# Patient Record
Sex: Female | Born: 1990 | Race: Black or African American | Hispanic: No | Marital: Single | Smoking: Never smoker
Health system: Southern US, Community
[De-identification: ages and names within clinical notes are randomized; demographics above are authoritative.]

## PROBLEM LIST (undated history)

## (undated) ENCOUNTER — Inpatient Hospital Stay (HOSPITAL_COMMUNITY): Payer: Self-pay

## (undated) DIAGNOSIS — A609 Anogenital herpesviral infection, unspecified: Secondary | ICD-10-CM

## (undated) DIAGNOSIS — G43909 Migraine, unspecified, not intractable, without status migrainosus: Secondary | ICD-10-CM

## (undated) DIAGNOSIS — F32A Depression, unspecified: Secondary | ICD-10-CM

## (undated) DIAGNOSIS — F329 Major depressive disorder, single episode, unspecified: Secondary | ICD-10-CM

## (undated) DIAGNOSIS — B1910 Unspecified viral hepatitis B without hepatic coma: Secondary | ICD-10-CM

## (undated) DIAGNOSIS — O99119 Other diseases of the blood and blood-forming organs and certain disorders involving the immune mechanism complicating pregnancy, unspecified trimester: Secondary | ICD-10-CM

## (undated) DIAGNOSIS — R519 Headache, unspecified: Secondary | ICD-10-CM

## (undated) DIAGNOSIS — O4400 Placenta previa specified as without hemorrhage, unspecified trimester: Secondary | ICD-10-CM

## (undated) DIAGNOSIS — R51 Headache: Secondary | ICD-10-CM

## (undated) DIAGNOSIS — D696 Thrombocytopenia, unspecified: Secondary | ICD-10-CM

## (undated) HISTORY — DX: Migraine, unspecified, not intractable, without status migrainosus: G43.909

## (undated) HISTORY — DX: Thrombocytopenia, unspecified: D69.6

## (undated) HISTORY — DX: Major depressive disorder, single episode, unspecified: F32.9

## (undated) HISTORY — DX: Depression, unspecified: F32.A

## (undated) HISTORY — DX: Other diseases of the blood and blood-forming organs and certain disorders involving the immune mechanism complicating pregnancy, unspecified trimester: O99.119

---

## 2014-01-15 ENCOUNTER — Emergency Department (HOSPITAL_COMMUNITY)
Admission: EM | Admit: 2014-01-15 | Discharge: 2014-01-15 | Disposition: A | Discharge status: Home / Self Care | Payer: Medicaid Other | Attending: Emergency Medicine | Admitting: Emergency Medicine

## 2014-01-15 ENCOUNTER — Encounter (HOSPITAL_COMMUNITY): Payer: Self-pay | Admitting: Emergency Medicine

## 2014-01-15 ENCOUNTER — Other Ambulatory Visit: Payer: Self-pay

## 2014-01-15 DIAGNOSIS — R51 Headache: Secondary | ICD-10-CM | POA: Insufficient documentation

## 2014-01-15 DIAGNOSIS — R55 Syncope and collapse: Secondary | ICD-10-CM | POA: Insufficient documentation

## 2014-01-15 DIAGNOSIS — R519 Headache, unspecified: Secondary | ICD-10-CM

## 2014-01-15 DIAGNOSIS — R9431 Abnormal electrocardiogram [ECG] [EKG]: Secondary | ICD-10-CM | POA: Insufficient documentation

## 2014-01-15 DIAGNOSIS — Z3202 Encounter for pregnancy test, result negative: Secondary | ICD-10-CM | POA: Insufficient documentation

## 2014-01-15 DIAGNOSIS — R42 Dizziness and giddiness: Secondary | ICD-10-CM | POA: Insufficient documentation

## 2014-01-15 LAB — CBC WITH DIFFERENTIAL/PLATELET
Basophils Absolute: 0 10*3/uL (ref 0.0–0.1)
Basophils Relative: 1 % (ref 0–1)
Eosinophils Absolute: 0.3 10*3/uL (ref 0.0–0.7)
Eosinophils Relative: 4 % (ref 0–5)
HCT: 36.8 % (ref 36.0–46.0)
Hemoglobin: 12.7 g/dL (ref 12.0–15.0)
Lymphocytes Relative: 39 % (ref 12–46)
Lymphs Abs: 2.6 10*3/uL (ref 0.7–4.0)
MCH: 28.7 pg (ref 26.0–34.0)
MCHC: 34.5 g/dL (ref 30.0–36.0)
MCV: 83.3 fL (ref 78.0–100.0)
Monocytes Absolute: 0.5 10*3/uL (ref 0.1–1.0)
Monocytes Relative: 8 % (ref 3–12)
Neutro Abs: 3.2 10*3/uL (ref 1.7–7.7)
Neutrophils Relative %: 48 % (ref 43–77)
Platelets: 146 10*3/uL — ABNORMAL LOW (ref 150–400)
RBC: 4.42 MIL/uL (ref 3.87–5.11)
RDW: 13 % (ref 11.5–15.5)
WBC: 6.7 10*3/uL (ref 4.0–10.5)

## 2014-01-15 LAB — BASIC METABOLIC PANEL
BUN: 11 mg/dL (ref 6–23)
CO2: 22 mEq/L (ref 19–32)
Calcium: 9.1 mg/dL (ref 8.4–10.5)
Chloride: 105 mEq/L (ref 96–112)
Creatinine, Ser: 0.84 mg/dL (ref 0.50–1.10)
GFR calc Af Amer: >90 mL/min (ref >90)
GFR calc non Af Amer: >90 mL/min (ref >90)
Glucose, Bld: 103 mg/dL — ABNORMAL HIGH (ref 70–99)
Potassium: 3.6 mEq/L — ABNORMAL LOW (ref 3.7–5.3)
Sodium: 139 mEq/L (ref 137–147)

## 2014-01-15 LAB — POC URINE PREG, ED: Preg Test, Ur: NEGATIVE

## 2014-01-15 MED ORDER — IBUPROFEN 800 MG PO TABS: 800.0000 mg | ORAL_TABLET | Freq: Three times a day (TID) | ORAL | Status: DC | PRN

## 2014-01-15 MED ORDER — KETOROLAC TROMETHAMINE 15 MG/ML IJ SOLN
15.0000 mg | Freq: Once | INTRAMUSCULAR | Status: AC
  Administered 2014-01-15: 15 mg via INTRAVENOUS
  Filled 2014-01-15: qty 1

## 2014-01-15 NOTE — ED Notes (Signed)
Per EMS: unwitnessed syncopal episode at work, time was 1100, patient works for [employer], has intermittent h/a that fluctuates between 5/10 and a 10/10 in severity, patient slightly tachycardic but otherwise VSS, CBG 110, denies.  EMS states fall was unwitnessed, patient was at work station when she had syncopal episode, no neural deficits noted

## 2014-01-15 NOTE — ED Provider Notes (Signed)
CSN: [id number]     Arrival date & time 01/15/14  1258 History   First MD Initiated Contact with Patient 01/15/14 1306     Chief Complaint  Patient presents with  . Loss of Consciousness     (Consider location/radiation/quality/duration/timing/severity/associated sxs/prior Treatment) HPI Comments: Robin Nguyen is a 23 y.o. Female in good health, presenting the Emergency Department with a chief complaint of syncope.  The patient reports an un witnessed syncopal event while at work today.  She reports the event was preceded by a generalized headache.  She reports similar episodes in the past with headaches, but has never been evaluated by a medical professional.  Denies seizure like activity or loss of bowel or bladder function. Denies history of arrythmia, murmur, or other cardiology disorders. LNMP: Patient's last menstrual period was 12/28/2013.     Patient is a 23 y.o. female presenting with syncope. The history is provided by the patient. No language interpreter was used.  Loss of Consciousness Associated symptoms: headaches   Associated symptoms: no chest pain, no fever, no palpitations, no seizures, no shortness of breath and no vomiting     History reviewed. No pertinent past medical history. History reviewed. No pertinent past surgical history. No family history on file. History  Substance Use Topics  . Smoking status: Never Smoker   . Smokeless tobacco: Not on file  . Alcohol Use: No   OB History   Grav Para Term Preterm Abortions TAB SAB Ect Mult Living                 Review of Systems  Constitutional: Negative for fever and chills.  Eyes: Negative for photophobia.  Respiratory: Negative for shortness of breath.   Cardiovascular: Positive for syncope. Negative for chest pain, palpitations and leg swelling.  Gastrointestinal: Negative for vomiting, abdominal pain and diarrhea.  Neurological: Positive for syncope, light-headedness and headaches. Negative for  tremors, seizures and numbness.      Allergies  Review of patient's allergies indicates not on file.  Home Medications   Current Outpatient Rx  Name  Route  Sig  Dispense  Refill  . ibuprofen (ADVIL,MOTRIN) 200 MG tablet   Oral   Take 200 mg by mouth every 6 (six) hours as needed for cramping.          BP 116/69  Pulse 93  Temp(Src) 99.4 F (37.4 C) (Oral)  Resp 17  Ht 5\' 6"  (1.676 m)  Wt 120 lb (54.432 kg)  BMI 19.38 kg/m2  SpO2 100%  LMP 12/28/2013 Physical Exam  Nursing note and vitals reviewed. Constitutional: She is oriented to person, place, and time. She appears well-developed and well-nourished. No distress.  HENT:  Head: Normocephalic and atraumatic.  Right Ear: External ear normal.  Left Ear: External ear normal.  Mouth/Throat: Oropharynx is clear and moist. No oropharyngeal exudate.  Eyes: EOM are normal. Pupils are equal, round, and reactive to light.  Neck: Neck supple. No rigidity.  Cardiovascular: Normal rate.  An irregular rhythm present.  No murmur heard. Pulmonary/Chest: Effort normal and breath sounds normal. No respiratory distress. She has no wheezes. She has no rales. She exhibits no tenderness.  Abdominal: Soft. Bowel sounds are normal. There is no tenderness. There is no rebound and no guarding.  Musculoskeletal: Normal range of motion.  Neurological: She is alert and oriented to person, place, and time. No cranial nerve deficit or sensory deficit. She exhibits normal muscle tone. Coordination normal. GCS eye subscore is 4. GCS verbal subscore  is 5. GCS motor subscore is 6.  Speech is clear and goal oriented, follows commands Cranial nerves III - XII grossly intact, no facial droop Normal strength in upper and lower extremities bilaterally, strong and equal grip strength Sensation normal to light touch Moves all 4 extremities without ataxia, coordination intact Normal finger to nose and rapid alternating movements No pronator drift Normal  gait  Skin: Skin is warm and dry. She is not diaphoretic.  Psychiatric: She has a normal mood and affect. Her behavior is normal. Thought content normal.    ED Course  Procedures (including critical care time) Labs Review Labs Reviewed  CBC WITH DIFFERENTIAL - Abnormal; Notable for the following:    Platelets 146 (*)    All other components within normal limits  BASIC METABOLIC PANEL - Abnormal; Notable for the following:    Potassium 3.6 (*)    Glucose, Bld 103 (*)    All other components within normal limits  POC URINE PREG, ED   Imaging Review No results found.   EKG Interpretation   Date/Time:  Thursday January 15 2014 13:01:56 EDT Ventricular Rate:  94 PR Interval:  145 QRS Duration: 82 QT Interval:  342 QTC Calculation: 428 R Axis:   90 Text Interpretation:  Sinus arrhythmia Probable left atrial enlargement  Borderline right axis deviation No prior Confirmed by Gwendolyn GrantWALDEN  MD, BLAIR  ([id number]) on 01/15/2014 1:34:28 PM      MDM   Final diagnoses:  Syncope  Headache  Abnormal EKG   Pt with history of syncopal events related to headaches. No neurologic deficits on exam. Abnormal sinus rhythm, no other concerning or correlating abnormalities with work up.Will have the patient follow up with neurology for headaches and cardio for further evaluation of arrythmia.  Re-eval pt resting comfortably in room reports decrease in headache discomfort with medication.  Discussed lab results, and treatment plan with the patient and the patient's mother. Return precautions given. Reports understanding and no other concerns at this time.  Patient is stable for discharge at this time.   Meds given in ED:  Medications  ketorolac (TORADOL) 15 MG/ML injection 15 mg (15 mg Intravenous Given 01/15/14 1619)    Discharge Medication List as of 01/15/2014  3:16 PM    START taking these medications   Details  !! ibuprofen (ADVIL,MOTRIN) 800 MG tablet Take 1 tablet (800 mg total) by mouth every 8  (eight) hours as needed for headache. Take with meals, Starting 01/15/2014, Until Discontinued, Print     !! - Potential duplicate medications found. Please discuss with provider.          Clabe SealLauren M Shalona Harbour, PA-C 01/17/14 1218

## 2014-01-15 NOTE — Discharge Instructions (Signed)
Call for a follow up appointment with a Family or Primary Care Provider.  Call for an appointment with a neurologist for further evaluation of your headaches and Cardiology if you continue to have syncope (passing out) episodes. Return if Symptoms worsen.   Take medication as prescribed.  Make sure you eat a well balanced meal and are drinking plenty of fluids.    Emergency Department Resource Guide 1) Find a Doctor and Pay Out of Pocket Although you won't have to find out who is covered by your insurance plan, it is a good idea to ask around and get recommendations. You will then need to call the office and see if the doctor you have chosen will accept you as a new patient and what types of options they offer for patients who are self-pay. Some doctors offer discounts or will set up payment plans for their patients who do not have insurance, but you will need to ask so you aren't surprised when you get to your appointment.  2) Contact Your Local Health Department Not all health departments have doctors that can see patients for sick visits, but many do, so it is worth a call to see if yours does. If you don't know where your local health department is, you can check in your phone book. The CDC also has a tool to help you locate your state's health department, and many state websites also have listings of all of their local health departments.  3) Find a Walk-in Clinic If your illness is not likely to be very severe or complicated, you may want to try a walk in clinic. These are popping up all over the country in pharmacies, drugstores, and shopping centers. They're usually staffed by nurse practitioners or physician assistants that have been trained to treat common illnesses and complaints. They're usually fairly quick and inexpensive. However, if you have serious medical issues or chronic medical problems, these are probably not your best option.  No Primary Care Doctor: - Call Health Connect at   [phone] - they can help you locate a primary care doctor that  accepts your insurance, provides certain services, etc. - Physician Referral Service- [phone]  Chronic Pain Problems: Organization         Address  Phone   Notes  [facility]  [phone] Patients need to be referred by their primary care doctor.   Medication Assistance: Organization         Address  Phone   Notes  [facility]ication [facility] [address] [place], [place] [address] [phone] --Must be a resident of [place] -- Must have NO insurance coverage whatsoever (no Medicaid/ Medicare, etc.) -- The pt. MUST have a primary care doctor that directs their care regularly and follows them in the community   MedAssist  [phone]) [phone]   [employer]  [phone]    Agencies that provide inexpensive medical care: Organization         Address  Phone   Notes  [facility]  [phone]) [phone]   [facility]    [phone]   [facility] [address] [place], [place] [address] [phone]   [facility] [address], [place] [phone]) [phone]   [facility]anned Parenthood    [phone]   [facility]    [phone]   [facility]  [address],  [place] Phone:  [phone], Fax:  [phone] Hours of Operation:  9 am - 6 pm, M-F.  Also accepts Medicaid/Medicare and self-pay.  [facility] for [place], [address], [place] Phone: [phone], Fax: [phone]. Hours of Operation:  8:30 am - 5:30 pm, M-F.  Also accepts Medicaid and self-pay.  [facility] [place] [address], [place] Phone: [phone]   [place], [place], [place] [phone], Ext. [phone] Mondays & Thursdays: 7-9 AM.  First 15 patients are seen on a first come, first serve basis.     [place] Providers:  Organization         Address  Phone   Notes  [facility] [address], [place] [phone] Also accepts self-pay patients.  [facility] [address], [place]  [phone]   [place], [address], [place] [phone]   [facility] [address], [place] [phone]   Lucianne Lei [address], [place]   [phone] Only accepts [place] Access [place] patients after they have their name applied to their card.   Self-Pay (no insurance) in [facility]:  Organization         Address  Phone   Notes  Sickle Cell Patients, [facility] [place] [phone]   [facility] [place] [phone]   [facility] [place]  [place], [place], [place] [phone]   [facility]/Dr. Osei-Bonsu  [address], [place] or [address], [place] [phone] Phone number for both [place] and [place] locations is the same.  [facility] [address] [place] [phone]   [facility] [address], [place] or [address] [phone]   [facility] [address], [place] [phone], phone; [phone], fax Sees patients 1st and 3rd Saturday of every month.  Must not qualify for public or private insurance (i.e. Medicaid, Medicare, [place] Health Choice, Veterans' Benefits)  Household income should be no more than 200% of the poverty level The clinic cannot treat you if you are pregnant or think you are pregnant  Sexually transmitted diseases are not treated at the clinic.    Dental Care: Organization         Address  Phone  Notes  [facility]  Department of [facility] [place] [phone] Accepts children up to age 40 who are enrolled in [place] or [place]; pregnant women with a Medicaid card; and children who have applied for Medicaid or [place] Health Choice, but were declined, whose parents can pay a reduced fee at time of service.  [facility] Department of [facility]  [address], [place] [phone] Accepts children up to age 58 who are enrolled in [place] or [place]; pregnant women with a Medicaid card; and children who have applied for Medicaid or [place] Health Choice, but were declined, whose parents can pay a reduced fee at time of service.  [place] Adult Dental Access PROGRAM  [place] [phone] Patients are seen by  appointment only. Walk-ins are not accepted. [place] will see patients 41 years of age and older. Monday - Tuesday (8am-5pm) Most Wednesdays (8:30-5pm) $30 per visit, cash only  [facility] Adult Dental Access PROGRAM  [address], [facility] [phone] Patients are seen by appointment only. Walk-ins are not accepted. [place] will see patients 34 years of age and older. One Wednesday Evening (Monthly: Volunteer Based).  $30 per visit, cash only  [place]  [phone] for adults; Children under age 21, call Graduate Pediatric Dentistry at [phone]. Children aged 56-14, please call [phone] to request a pediatric application.  Dental services are provided in all areas of dental care including fillings, crowns and bridges, complete and partial dentures, implants, gum treatment, root canals, and extractions. Preventive care is also provided. Treatment is provided to both adults and children. Patients are selected via a lottery and there is often a waiting list.   [facility] [address] [place]  [phone]  [email or web address]   [facility] [address] [place], [place] [phone], Ext. [phone] Second and Fourth Thursday of each month, opens at 6:30 AM; Clinic ends at 9 AM.  Patients are seen on a first-come first-served basis, and a limited number are seen during each clinic.   [facility]  [address] [place], [place] [phone]   Eligibility Requirements You must have lived in [place], [place], or [place] counties for at least the last three months.   You cannot be eligible for state or federal sponsored [employer], including [employer], [place], or [employer].   You generally cannot be eligible for healthcare insurance through your employer.    How to apply: Eligibility screenings are held every Tuesday and Wednesday afternoon from 1:00 pm until 4:00 pm. You do not need an appointment for the interview!  [facility] [address] [place], [place]   [place]  [place] Department  [place]  [phone]    Behavioral Health Resources in the Community: Intensive Outpatient Programs Organization         Address  Phone  Notes  [facility]. [address] [place], [place] [phone]   [facility] [address] [place], [place]   ADS: Alcohol & Drug Svcs [address], [place], [place]   [place] [address],  [place], [place] or [phone]   Substance Abuse Resources Organization         Address  Phone  Notes  Alcohol and Drug Services  [phone]   [place]  [phone]   [facility] [place]   Chinita Pester  [phone]   Residential & Outpatient Substance Abuse Program  [phone]   Psychological Services Organization          Address  Phone  Notes  [facility]  [place]  [phone]   [place] [address] [place] [phone] or [phone]    Mobile Crisis Teams Organization         Address  Phone  Notes  [facility], Mobile Crisis Care Unit  [phone]   [facility] Psychotherapeutic [facility]  [address] [place], [place]   [facility] [address], [place] [phone]  Self-Help/Support Groups Organization         Address  Phone             Notes  [facility] of [place] - variety of support groups  [place] Call for more information  Narcotics Anonymous (NA), [facility]aring [facility] [address], [employer] [place]  2 meetings at this location   Special educational needs teacher         Address  Phone  Notes  [facility]AP Residential Treatment [place],    [place]  [phone]   [facility]  [address] [place] [phone], [place], [place]   [place], [place] [phone] Admissions: 8am-3pm M-F  Incentives Substance [place] [address]    [place], [place] [phone]   [facility] [address] [place], [place], [place]   [facility] [address]  [place], [place]   [facility]ight Programs - Intensive Outpatient [address], [place], [place]   [facility] [address]  [place], [place] [phone] or [phone]   [facility] Services (RTS) [address] [place], [place] Accepts Medicaid  [facility] [place] [address]  [place] [phone] Substance Abuse/Addiction Treatment   [facility] Organization         Address  Phone  Notes  [facility]  [phone]   Domenic Schwab, PhD [address] Arlis [place], [place]   [phone] or [phone]   [place], [place] [phone]   [facility] [address] [place], [place] [phone] Insurance/Medicaid/sponsorship through [facility] and Families [address], [address]                                    [place], [place] [phone] [place] [address], [place] [phone]    Dr. Adele Schilder  [phone]   [facility] Dept. 1) [address]  2) [place] 3)  [address], [place] [phone]  [phone]   [place] [phone] or [phone] (After Hours)

## 2014-01-19 NOTE — ED Provider Notes (Signed)
Medical screening examination/treatment/procedure(s) were performed by non-physician practitioner and as supervising physician I was immediately available for consultation/collaboration.   EKG Interpretation   Date/Time:  Thursday January 15 2014 13:01:56 EDT Ventricular Rate:  94 PR Interval:  145 QRS Duration: 82 QT Interval:  342 QTC Calculation: 428 R Axis:   90 Text Interpretation:  Sinus arrhythmia Probable left atrial enlargement  Borderline right axis deviation ED PHYSICIAN INTERPRETATION AVAILABLE IN  [facility] Confirmed by TEST, Record ([id number]) on 01/17/2014 1:11:02 PM        Dagmar HaitWilliam Ladarrian Asencio, MD 01/19/14 [phone]

## 2014-04-10 ENCOUNTER — Other Ambulatory Visit: Payer: Self-pay

## 2014-04-10 ENCOUNTER — Other Ambulatory Visit: Payer: Self-pay | Admitting: Obstetrics & Gynecology

## 2014-04-10 ENCOUNTER — Other Ambulatory Visit (HOSPITAL_COMMUNITY)
Admission: RE | Admit: 2014-04-10 | Discharge: 2014-04-10 | Disposition: A | Discharge status: Home / Self Care | Payer: BC Managed Care – PPO | Source: Ambulatory Visit | Attending: Obstetrics & Gynecology | Admitting: Obstetrics & Gynecology

## 2014-04-10 DIAGNOSIS — Z113 Encounter for screening for infections with a predominantly sexual mode of transmission: Secondary | ICD-10-CM | POA: Insufficient documentation

## 2014-04-10 DIAGNOSIS — Z01419 Encounter for gynecological examination (general) (routine) without abnormal findings: Secondary | ICD-10-CM | POA: Insufficient documentation

## 2014-04-13 LAB — CYTOLOGY - PAP

## 2014-05-01 ENCOUNTER — Ambulatory Visit (INDEPENDENT_AMBULATORY_CARE_PROVIDER_SITE_OTHER): Payer: BC Managed Care – PPO | Admitting: Internal Medicine

## 2014-05-01 ENCOUNTER — Encounter: Payer: Self-pay | Admitting: Internal Medicine

## 2014-05-01 VITALS — BP 122/83 | HR 94 | Temp 98.3°F | Ht 64.0 in | Wt 113.0 lb

## 2014-05-01 DIAGNOSIS — B1910 Unspecified viral hepatitis B without hepatic coma: Secondary | ICD-10-CM | POA: Insufficient documentation

## 2014-05-01 DIAGNOSIS — B1911 Unspecified viral hepatitis B with hepatic coma: Secondary | ICD-10-CM

## 2014-05-01 DIAGNOSIS — B162 Acute hepatitis B without delta-agent with hepatic coma: Secondary | ICD-10-CM

## 2014-05-01 NOTE — Progress Notes (Signed)
   Subjective:    Patient ID: Robin Nguyen, female    DOB: 02/17/1991, 23 y.o.   MRN: [id number]  HPI Here for evaluation as a new patient.  Originally [place].  [place] [facility] with abdominal pain and work up revealed positive hepatitis B surface antigen.  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  No known history of any liver issues, no history of jaundice.  No known history in her family.  No known liver cancer, cirrhosis.     Review of Systems  Constitutional: Negative for fever, fatigue and unexpected weight change.  HENT:       No icterus  Gastrointestinal: Positive for abdominal pain. Negative for nausea and diarrhea.  Skin: Negative for pallor and rash.  Neurological: Negative for dizziness and light-headedness.       Objective:   Physical Exam  Constitutional: She appears well-developed and well-nourished. No distress.  HENT:  Mouth/Throat: No oropharyngeal exudate.  Eyes: Right eye exhibits no discharge. Left eye exhibits no discharge. No scleral icterus.  Skin: No rash noted.  Psychiatric: She has a normal mood and affect.          Assessment & Plan:

## 2014-05-01 NOTE — Assessment & Plan Note (Addendum)
Reviewed labs and discussed hepatitis B with patient.  Will need to check DNA to see if needed for treatment.  Discussed implications with birth.    RTC 2 weeks to discuss if active.  I suspect with E Ab positive, it has converted.    Will need hep A vaccine if non immune.   Will consider elastography if active.   30 minutes spent including 15 minutes face to face counseling

## 2014-05-02 LAB — HEPATITIS A ANTIBODY, TOTAL: Hep A Total Ab: REACTIVE — AB

## 2014-05-02 LAB — HEPATITIS C ANTIBODY: HCV Ab: NEGATIVE

## 2014-05-02 LAB — HIV ANTIBODY (ROUTINE TESTING W REFLEX): HIV 1&2 Ab, 4th Generation: NONREACTIVE

## 2014-05-04 LAB — HEPATITIS B E ANTIBODY: Hepatitis Be Antibody: REACTIVE — AB

## 2014-05-04 LAB — HEPATITIS B E ANTIGEN: Hepatitis Be Antigen: NONREACTIVE

## 2014-05-07 ENCOUNTER — Other Ambulatory Visit (HOSPITAL_COMMUNITY): Payer: BC Managed Care – PPO

## 2014-05-07 ENCOUNTER — Other Ambulatory Visit (HOSPITAL_COMMUNITY): Payer: Self-pay | Admitting: Internal Medicine

## 2014-05-07 DIAGNOSIS — R9431 Abnormal electrocardiogram [ECG] [EKG]: Secondary | ICD-10-CM

## 2014-05-09 LAB — HEPATITIS B DNA, ULTRAQUANTITATIVE, PCR
Hepatitis B DNA (Calc): 4039 copies/mL — ABNORMAL HIGH (ref <116)
Hepatitis B DNA: 694 IU/mL — ABNORMAL HIGH (ref <20)

## 2014-05-19 ENCOUNTER — Ambulatory Visit (INDEPENDENT_AMBULATORY_CARE_PROVIDER_SITE_OTHER): Payer: BC Managed Care – PPO | Admitting: Internal Medicine

## 2014-05-19 ENCOUNTER — Encounter: Payer: Self-pay | Admitting: Internal Medicine

## 2014-05-19 VITALS — BP 117/68 | HR 57 | Temp 98.0°F | Wt 115.0 lb

## 2014-05-19 DIAGNOSIS — B181 Chronic viral hepatitis B without delta-agent: Secondary | ICD-10-CM

## 2014-05-19 NOTE — Progress Notes (Signed)
   Subjective:    Patient ID: Robin Nguyen, female    DOB: 09/02/1991, 23 y.o.   MRN: [id number]  HPI  Here for follow up of HBV.  Originally from [place] and [place].  [place] [facility] with [place] pain and work up revealed positive hepatitis B surface antigen.  Other labs include hepatitis B e Ab positive, e Ag negative, confirmed with labs here.  No known history of any liver issues, no history of jaundice.  No known history in her family.  No known liver cancer, cirrhosis.       DNA is 694 IU.  Hep A immune, HIV negative.     Review of Systems  Constitutional: Negative for fever, fatigue and unexpected weight change.  HENT:       No icterus  Gastrointestinal: Positive for abdominal pain. Negative for nausea and diarrhea.  Skin: Negative for pallor and rash.  Neurological: Negative for dizziness and light-headedness.       Objective:   Physical Exam  Constitutional: She appears well-developed and well-nourished. No distress.  HENT:  Mouth/Throat: No oropharyngeal exudate.  Eyes: Right eye exhibits no discharge. Left eye exhibits no discharge. No scleral icterus.  Skin: No rash noted.  Psychiatric: She has a normal mood and affect.          Assessment & Plan:

## 2014-05-19 NOTE — Assessment & Plan Note (Signed)
I reviewed labs with patient.  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  I will check elastography to be sure no liver disease and recheck DNA and e Ag in 3 months x 2.

## 2014-05-26 ENCOUNTER — Ambulatory Visit (HOSPITAL_COMMUNITY): Payer: BC Managed Care – PPO

## 2014-06-02 ENCOUNTER — Ambulatory Visit (HOSPITAL_COMMUNITY): Payer: BC Managed Care – PPO

## 2014-06-11 ENCOUNTER — Ambulatory Visit (INDEPENDENT_AMBULATORY_CARE_PROVIDER_SITE_OTHER): Payer: BC Managed Care – PPO | Admitting: Family Medicine

## 2014-06-11 VITALS — BP 96/60 | HR 78 | Temp 98.4°F | Resp 16 | Ht 64.0 in | Wt 114.2 lb

## 2014-06-11 DIAGNOSIS — Z23 Encounter for immunization: Secondary | ICD-10-CM

## 2014-06-11 DIAGNOSIS — R300 Dysuria: Secondary | ICD-10-CM

## 2014-06-11 DIAGNOSIS — Z113 Encounter for screening for infections with a predominantly sexual mode of transmission: Secondary | ICD-10-CM

## 2014-06-11 LAB — POCT WET PREP WITH KOH
Clue Cells Wet Prep HPF POC: NEGATIVE
KOH Prep POC: NEGATIVE
RBC Wet Prep HPF POC: NEGATIVE
Trichomonas, UA: NEGATIVE
Yeast Wet Prep HPF POC: NEGATIVE

## 2014-06-11 LAB — POCT UA - MICROSCOPIC ONLY
Bacteria, U Microscopic: NEGATIVE
Casts, Ur, LPF, POC: NEGATIVE
Crystals, Ur, HPF, POC: NEGATIVE
Mucus, UA: NEGATIVE
RBC, urine, microscopic: NEGATIVE
Yeast, UA: NEGATIVE

## 2014-06-11 LAB — POCT URINALYSIS DIPSTICK
Bilirubin, UA: NEGATIVE
Blood, UA: NEGATIVE
Glucose, UA: NEGATIVE
Ketones, UA: NEGATIVE
Leukocytes, UA: NEGATIVE
Nitrite, UA: NEGATIVE
Protein, UA: NEGATIVE
Spec Grav, UA: 1.015
Urobilinogen, UA: 1
pH, UA: 7

## 2014-06-11 LAB — OB RESULTS CONSOLE GC/CHLAMYDIA
Chlamydia: NEGATIVE
Gonorrhea: NEGATIVE

## 2014-06-11 LAB — POCT URINE PREGNANCY: Preg Test, Ur: NEGATIVE

## 2014-06-11 MED ORDER — PHENAZOPYRIDINE HCL 200 MG PO TABS: 200.0000 mg | ORAL_TABLET | Freq: Three times a day (TID) | ORAL | Status: DC | PRN
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
## 2014-06-12 LAB — HIV ANTIBODY (ROUTINE TESTING W REFLEX): HIV 1&2 Ab, 4th Generation: NONREACTIVE

## 2014-06-12 LAB — RPR

## 2014-06-12 LAB — GC/CHLAMYDIA PROBE AMP
CT Probe RNA: NEGATIVE
GC Probe RNA: NEGATIVE

## 2014-06-23 ENCOUNTER — Encounter (HOSPITAL_COMMUNITY): Payer: Self-pay | Admitting: *Deleted

## 2014-06-23 ENCOUNTER — Inpatient Hospital Stay (HOSPITAL_COMMUNITY)
Admission: AD | Admit: 2014-06-23 | Discharge: 2014-06-23 | Disposition: A | Discharge status: Home / Self Care | Payer: BC Managed Care – PPO | Source: Ambulatory Visit | Attending: Family Medicine | Admitting: Family Medicine

## 2014-06-23 DIAGNOSIS — Z3201 Encounter for pregnancy test, result positive: Secondary | ICD-10-CM | POA: Diagnosis not present

## 2014-06-23 DIAGNOSIS — Z3202 Encounter for pregnancy test, result negative: Secondary | ICD-10-CM | POA: Diagnosis not present

## 2014-06-23 DIAGNOSIS — Z3200 Encounter for pregnancy test, result unknown: Secondary | ICD-10-CM | POA: Diagnosis present

## 2014-06-23 DIAGNOSIS — N644 Mastodynia: Secondary | ICD-10-CM | POA: Diagnosis not present

## 2014-06-23 HISTORY — DX: Unspecified viral hepatitis B without hepatic coma: B19.10

## 2014-06-23 LAB — POCT PREGNANCY, URINE: Preg Test, Ur: POSITIVE — AB

## 2014-06-23 MED ORDER — COMPLETENATE 29-1 MG PO CHEW: 1.0000 | CHEWABLE_TABLET | Freq: Every day | ORAL | Status: DC

## 2014-06-23 NOTE — MAU Note (Signed)
Was told in July she had Hep B.

## 2014-06-23 NOTE — MAU Note (Signed)
Since August has noted enlargement of breast and tenderness.  Went to hosp, was told she is not preg.  Has not seen her period.  Did a home test yesterday- was positive.

## 2014-06-23 NOTE — MAU Provider Note (Signed)
Attestation of Attending Supervision of Obstetric Fellow: Evaluation and management procedures were performed by the Obstetric Fellow under my supervision and collaboration.  I have reviewed the Obstetric Fellow's note and chart, and I agree with the management and plan.  Jacob Stinson, DO Attending Physician Faculty Practice, [facility]  

## 2014-06-23 NOTE — Discharge Instructions (Signed)
First Trimester of Pregnancy The first trimester of pregnancy is from week 1 until the end of week 12 (months 1 through 3). During this time, your baby will begin to develop inside you. At 6-8 weeks, the eyes and face are formed, and the heartbeat can be seen on ultrasound. At the end of 12 weeks, all the baby's organs are formed. Prenatal care is all the medical care you receive before the birth of your baby. Make sure you get good prenatal care and follow all of your doctor's instructions. HOME CARE  Medicines  Take medicine only as told by your doctor. Some medicines are safe and some are not during pregnancy.  Take your prenatal vitamins as told by your doctor.  Take medicine that helps you poop (stool softener) as needed if your doctor says it is okay. Diet  Eat regular, healthy meals.  Your doctor will tell you the amount of weight gain that is right for you.  Avoid raw meat and uncooked cheese.  If you feel sick to your stomach (nauseous) or throw up (vomit):  Eat 4 or 5 small meals a day instead of 3 large meals.  Try eating a few soda crackers.  Drink liquids between meals instead of during meals.  If you have a hard time pooping (constipation):  Eat high-fiber foods like fresh vegetables, fruit, and whole grains.  Drink enough fluids to keep your pee (urine) clear or pale yellow. Activity and Exercise  Exercise only as told by your doctor. Stop exercising if you have cramps or pain in your lower belly (abdomen) or low back.  Try to avoid standing for long periods of time. Move your legs often if you must stand in one place for a long time.  Avoid heavy lifting.  Wear low-heeled shoes. Sit and stand up straight.  You can have sex unless your doctor tells you not to. Relief of Pain or Discomfort  Wear a good support bra if your breasts are sore.  Take warm water baths (sitz baths) to soothe pain or discomfort caused by hemorrhoids. Use hemorrhoid cream if your  doctor says it is okay.  Rest with your legs raised if you have leg cramps or low back pain.  Wear support hose if you have puffy, bulging veins (varicose veins) in your legs. Raise (elevate) your feet for 15 minutes, 3-4 times a day. Limit salt in your diet. Prenatal Care  Schedule your prenatal visits by the twelfth week of pregnancy.  Write down your questions. Take them to your prenatal visits.  Keep all your prenatal visits as told by your doctor. Safety  Wear your seat belt at all times when driving.  Make a list of emergency phone numbers. The list should include numbers for family, friends, the hospital, and police and fire departments. General Tips  Ask your doctor for a referral to a local prenatal class. Begin classes no later than at the start of month 6 of your pregnancy.  Ask for help if you need counseling or help with nutrition. Your doctor can give you advice or tell you where to go for help.  Do not use hot tubs, steam rooms, or saunas.  Do not douche or use tampons or scented sanitary pads.  Do not cross your legs for long periods of time.  Avoid litter boxes and soil used by cats.  Avoid all smoking, herbs, and alcohol. Avoid drugs not approved by your doctor.  Visit your dentist. At home, brush your teeth   with a soft toothbrush. Be gentle when you floss. GET HELP IF:  You are dizzy.  You have mild cramps or pressure in your lower belly.  You have a nagging pain in your belly area.  You continue to feel sick to your stomach, throw up, or have watery poop (diarrhea).  You have a bad smelling fluid coming from your vagina.  You have pain with peeing (urination).  You have increased puffiness (swelling) in your face, hands, legs, or ankles. GET HELP RIGHT AWAY IF:   You have a fever.  You are leaking fluid from your vagina.  You have spotting or bleeding from your vagina.  You have very bad belly cramping or pain.  You gain or lose weight  rapidly.  You throw up blood. It may look like coffee grounds.  You are around people who have German measles, fifth disease, or chickenpox.  You have a very bad headache.  You have shortness of breath.  You have any kind of trauma, such as from a fall or a car accident. Document Released: 03/20/2008 Document Revised: 02/16/2014 Document Reviewed: 08/12/2013 ExitCare Patient Information 2015 [employer]. This information is not intended to replace advice given to you by your health care provider. Make sure you discuss any questions you have with your health care provider.  

## 2014-06-23 NOTE — MAU Provider Note (Signed)
  History     CSN: [id number]  Arrival date and time: 06/23/14 0750   First Provider Initiated Contact with Patient 06/23/14 0830      Chief Complaint  Patient presents with  . Breast Pain  . Possible Pregnancy   HPI  Patient is 23 y.o. G1P0 [redacted]w[redacted]d, here to confirm pregnancy.  Not taking a prenatal yet.  No vaginal bleeding or cramping.     Past Medical History  Diagnosis Date  . Hepatitis B     Past Surgical History  Procedure Laterality Date  . No past surgeries      History reviewed. No pertinent family history.  History  Substance Use Topics  . Smoking status: Never Smoker   . Smokeless tobacco: Never Used  . Alcohol Use: No    Allergies: No Known Allergies  Prescriptions prior to admission  Medication Sig Dispense Refill  . adapalene (DIFFERIN) 0.1 % gel       . FINACEA 15 % cream       . phenazopyridine (PYRIDIUM) 200 MG tablet Take 1 tablet (200 mg total) by mouth 3 (three) times daily as needed for pain.  10 tablet  0    Review of Systems  Constitutional: Negative for fever and chills.  Respiratory: Negative for cough and shortness of breath.   Cardiovascular: Negative for chest pain and leg swelling.  Gastrointestinal: Negative for heartburn, nausea, vomiting and diarrhea.  Genitourinary: Negative for dysuria, urgency, frequency and hematuria.  Neurological:       No headache   Physical Exam   Blood pressure 128/88, pulse 84, temperature 98.6 F (37 C), temperature source Oral, resp. rate 16, height 5' 2.5" (1.588 m), weight 113 lb (51.256 kg), last menstrual period 05/12/2014.  Physical Exam  Constitutional: She is oriented to person, place, and time. She appears well-developed and well-nourished.  HENT:  Head: Normocephalic and atraumatic.  Eyes: Conjunctivae and EOM are normal.  Neck: Normal range of motion.  Cardiovascular: Normal rate.   Respiratory: Effort normal. No respiratory distress.  GI: Soft. She exhibits no distension. There is  no tenderness.  Musculoskeletal: Normal range of motion. She exhibits no edema.  Neurological: She is alert and oriented to person, place, and time.  Skin: Skin is warm and dry. No erythema.    MAU Course  Procedures  MDM Pregnancy test+  Assessment and Plan  Rx prenatal vitamin D/c differin gel May continue finacea Establish care  Coury Grieger ROCIO 06/23/2014, 8:30 AM

## 2014-06-28 ENCOUNTER — Inpatient Hospital Stay (HOSPITAL_COMMUNITY)
Admission: AD | Admit: 2014-06-28 | Discharge: 2014-06-28 | Disposition: A | Discharge status: Home / Self Care | Payer: BC Managed Care – PPO | Source: Ambulatory Visit | Attending: Obstetrics & Gynecology | Admitting: Obstetrics & Gynecology

## 2014-06-28 ENCOUNTER — Encounter (HOSPITAL_COMMUNITY): Payer: Self-pay

## 2014-06-28 DIAGNOSIS — O9989 Other specified diseases and conditions complicating pregnancy, childbirth and the puerperium: Principal | ICD-10-CM

## 2014-06-28 DIAGNOSIS — R51 Headache: Secondary | ICD-10-CM | POA: Diagnosis not present

## 2014-06-28 DIAGNOSIS — O99891 Other specified diseases and conditions complicating pregnancy: Secondary | ICD-10-CM | POA: Diagnosis not present

## 2014-06-28 LAB — URINALYSIS, ROUTINE W REFLEX MICROSCOPIC
Bilirubin Urine: NEGATIVE
Glucose, UA: NEGATIVE mg/dL
Hgb urine dipstick: NEGATIVE
Ketones, ur: NEGATIVE mg/dL
Leukocytes, UA: NEGATIVE
Nitrite: NEGATIVE
Protein, ur: NEGATIVE mg/dL
Specific Gravity, Urine: <1.005 — ABNORMAL LOW (ref 1.005–1.030)
Urobilinogen, UA: 0.2 mg/dL (ref 0.0–1.0)
pH: 6.5 (ref 5.0–8.0)

## 2014-06-28 MED ORDER — IBUPROFEN 600 MG PO TABS
600.0000 mg | ORAL_TABLET | Freq: Once | ORAL | Status: AC
  Administered 2014-06-28: 600 mg via ORAL
  Filled 2014-06-28: qty 1

## 2014-06-28 MED ORDER — IBUPROFEN 600 MG PO TABS: 600.0000 mg | ORAL_TABLET | Freq: Four times a day (QID) | ORAL | Status: DC | PRN

## 2014-06-28 NOTE — MAU Provider Note (Signed)
History     CSN: [id number]  Arrival date and time: 06/28/14 1345   First Provider Initiated Contact with Patient 06/28/14 1404      Chief Complaint  Patient presents with  . Dizziness  . Headache   HPI 23 y.o. G1P0 at [redacted]w[redacted]d with headache daily for the past 5 months. States the headache starts when she is at work, resolves when she leaves work. States when the headache occurs, it is severe, but currently it is mild and resolving. She denies visual changes, photophobia, n/v. She was seen at [facility] ED in April following a syncopal episode that occurred at work w/ headache, she states they gave her a medication that did not help and she has not taken it since April. She states she hasn't tried anything to help the headaches. She is getting prenatal care at the [facility] clinic. Pt repeatedly requested work excuse note.   Past Medical History  Diagnosis Date  . Hepatitis B     Past Surgical History  Procedure Laterality Date  . No past surgeries      History reviewed. No pertinent family history.  History  Substance Use Topics  . Smoking status: Never Smoker   . Smokeless tobacco: Never Used  . Alcohol Use: No    Allergies: No Known Allergies  No prescriptions prior to admission    Review of Systems  Constitutional: Negative.   Eyes: Negative for blurred vision and photophobia.  Respiratory: Negative.   Cardiovascular: Negative.   Gastrointestinal: Negative for nausea, vomiting, abdominal pain, diarrhea and constipation.  Genitourinary: Negative for dysuria, urgency, frequency, hematuria and flank pain.       Negative for vaginal bleeding, vaginal discharge, dyspareunia  Musculoskeletal: Negative.   Neurological: Positive for dizziness and headaches. Negative for sensory change, speech change, focal weakness, seizures and loss of consciousness.  Psychiatric/Behavioral: Negative.    Physical Exam   Blood pressure 122/72, pulse 78, temperature 98.3 F (36.8 C), temperature  source Oral, resp. rate 16, last menstrual period 05/12/2014.  Physical Exam  Nursing note and vitals reviewed. Constitutional: She is oriented to person, place, and time. She appears well-developed and well-nourished. No distress (pt is sitting up on edge of bed, smiling and talking, no evidence of distress or discomfort).  HENT:  Head: Normocephalic and atraumatic.  Neck: Normal range of motion.  Musculoskeletal: Normal range of motion.  Neurological: She is alert and oriented to person, place, and time. No cranial nerve deficit.  Skin: Skin is warm and dry.  Psychiatric: She has a normal mood and affect.    MAU Course  Procedures  Results for orders placed during the hospital encounter of 06/28/14 (from the past 24 hour(s))  URINALYSIS, ROUTINE W REFLEX MICROSCOPIC     Status: Abnormal   Collection Time    06/28/14  1:55 PM      Result Value Ref Range   Color, Urine YELLOW  YELLOW   APPearance CLEAR  CLEAR   Specific Gravity, Urine <1.005 (*) 1.005 - 1.030   pH 6.5  5.0 - 8.0   Glucose, UA NEGATIVE  NEGATIVE mg/dL   Hgb urine dipstick NEGATIVE  NEGATIVE   Bilirubin Urine NEGATIVE  NEGATIVE   Ketones, ur NEGATIVE  NEGATIVE mg/dL   Protein, ur NEGATIVE  NEGATIVE mg/dL   Urobilinogen, UA 0.2  0.0 - 1.0 mg/dL   Nitrite NEGATIVE  NEGATIVE   Leukocytes, UA NEGATIVE  NEGATIVE   Mild headache improved w/ Motrin 600 mg PO  Assessment and  Plan   1. Headache(784.0)   Considering that her headache is chronic and not severe and that she is in no distress at this time, recommended f/u as scheduled for regular OB visit, discuss headaches at that appointment, may need referral for further eval of headaches. Motrin rx provided. Note provided for work.     Medication List         FINACEA 15 % cream  Generic drug:  Azelaic Acid  Apply 1 application topically at bedtime.     ibuprofen 600 MG tablet  Commonly known as:  ADVIL,MOTRIN  Take 1 tablet (600 mg total) by mouth every 6  (six) hours as needed.     prenatal vitamin w/FE, FA 29-1 MG Chew chewable tablet  Chew 1 tablet by mouth daily at 12 noon.            Follow-up Information   Follow up with [facility]. (as scheduled - discuss headaches at next prenatal visit)    Contact information:   [address] [place] [address]         Israel Werts 06/28/2014, 4:47 PM

## 2014-06-28 NOTE — MAU Note (Signed)
Pt presents complaining of a headache rating 7/10. States she was told by the hospital that she has a migraine but they did not check her head. Has not taken anything for the pain but states she has a prescription but it does not work. Pt also complaining that she vomited once today at work and saw blood in her nose.

## 2014-07-09 LAB — OB RESULTS CONSOLE HEPATITIS B SURFACE ANTIGEN: Hepatitis B Surface Ag: POSITIVE

## 2014-07-09 LAB — OB RESULTS CONSOLE ABO/RH: RH Type: POSITIVE

## 2014-07-09 LAB — OB RESULTS CONSOLE RUBELLA ANTIBODY, IGM: Rubella: IMMUNE

## 2014-07-09 LAB — OB RESULTS CONSOLE ANTIBODY SCREEN: Antibody Screen: NEGATIVE

## 2014-07-16 ENCOUNTER — Encounter (HOSPITAL_COMMUNITY): Payer: Self-pay | Admitting: Obstetrics & Gynecology

## 2014-07-24 ENCOUNTER — Ambulatory Visit (HOSPITAL_COMMUNITY)
Admission: RE | Admit: 2014-07-24 | Discharge: 2014-07-24 | Disposition: A | Discharge status: Home / Self Care | Payer: BC Managed Care – PPO | Source: Ambulatory Visit | Attending: Obstetrics & Gynecology | Admitting: Obstetrics & Gynecology

## 2014-07-24 ENCOUNTER — Encounter (HOSPITAL_COMMUNITY): Payer: Self-pay

## 2014-07-24 VITALS — BP 116/81 | HR 68 | Wt 114.0 lb

## 2014-07-24 DIAGNOSIS — B181 Chronic viral hepatitis B without delta-agent: Secondary | ICD-10-CM

## 2014-07-24 DIAGNOSIS — O98411 Viral hepatitis complicating pregnancy, first trimester: Secondary | ICD-10-CM | POA: Insufficient documentation

## 2014-07-24 DIAGNOSIS — Z3A10 10 weeks gestation of pregnancy: Secondary | ICD-10-CM | POA: Diagnosis not present

## 2014-07-24 DIAGNOSIS — B1910 Unspecified viral hepatitis B without hepatic coma: Secondary | ICD-10-CM | POA: Insufficient documentation

## 2014-07-24 NOTE — Consult Note (Signed)
Maternal Fetal Medicine Consultation  Requesting Provider(s): Robin HidalgoJennifer Ozan, DO  Reason for consultation: Chronic Hepatitis B  HPI: Robin Nguyen is a 23 yo G1P0, EDD 02/16/2015 currently at 10w 3d who was seen for consultation due to a recent diagnosis of Chronic Hepatitis B.  The patient was given this diagnosis in June 2015 - has not need a GI specialist or required treatment to date.  She denies a history of IV drug abuse.  Her prenatal course thus far has otherwise been uncomplicated.  She is without complaints today.  OB History: OB History   Grav Para Term Preterm Abortions TAB SAB Ect Mult Living   1               PMH:  Past Medical History  Diagnosis Date  . Hepatitis B     PSH:  Past Surgical History  Procedure Laterality Date  . No past surgeries     Meds: Prenatal vitamins  Allergies: No Known Allergies  FH: reports that cousin had a congenital heart abnormality (? VSD)  Soc:  History   Social History  . Marital Status: Single    Spouse Name: N/A    Number of Children: N/A  . Years of Education: N/A   Occupational History  . Not on file.   Social History Main Topics  . Smoking status: Never Smoker   . Smokeless tobacco: Never Used  . Alcohol Use: No  . Drug Use: No  . Sexual Activity: Yes    Partners: Male    Birth Control/ Protection: None   Other Topics Concern  . Not on file   Social History Narrative  . No narrative on file    Review of Systems: no vaginal bleeding or cramping/contractions, no LOF, no nausea/vomiting. All other systems reviewed and are negative.  PE:   Filed Vitals:   07/24/14 1014  BP: 116/81  Pulse: 68   A/P: 1) Single IUP at 10w 3d         2) Chronic Hepatitis B - HepBsAg positive, HepBe antigen negative, HepB core Ab and HepBeAb positive.  We had a brief discussion regarding risk of perinatal transmission and the need for  HepB IgG and HepB vaccine of the newborn after birth.  Chronic Hep B is not a  contraindication for breast feeding and she should be encouraged to breast feed.  Recommendations: 1) Please check LFTs - would repeat each trimester of pregnancy. 2) Please check HepB DNA levels.  If levels are elevated (> 1,000,000 copies), she would be eligible for antiviral therapy with telbivadine, tenofovir or lamivudine (would recommend referral to GI if values are elevated to this level). Would also recommend repeating HBV DNA at 26-28 weeks. 3) Within 12 hours of birth, infant should initiate HepB vaccine series and receive HebB IG - notify Peds at time of delivery 4) Would avoid FSE, AROM in labor.  Amniocentesis and CVS is a relative contraindication.  Thank you for the opportunity to be a part of the care of [facility]. Please contact our office if we can be of further assistance.   I spent approximately 30 minutes with this patient with over 50% of time spent in face-to-face counseling.  Alpha GulaPaul Saina Waage, MD Maternal Fetal Medicine

## 2014-08-17 ENCOUNTER — Encounter (HOSPITAL_COMMUNITY): Payer: Self-pay

## 2014-08-18 ENCOUNTER — Emergency Department (HOSPITAL_COMMUNITY)
Admission: EM | Admit: 2014-08-18 | Discharge: 2014-08-18 | Disposition: A | Discharge status: Home / Self Care | Payer: BC Managed Care – PPO | Attending: Emergency Medicine | Admitting: Emergency Medicine

## 2014-08-18 DIAGNOSIS — Z8619 Personal history of other infectious and parasitic diseases: Secondary | ICD-10-CM | POA: Insufficient documentation

## 2014-08-18 DIAGNOSIS — O9989 Other specified diseases and conditions complicating pregnancy, childbirth and the puerperium: Secondary | ICD-10-CM | POA: Diagnosis not present

## 2014-08-18 DIAGNOSIS — R51 Headache: Secondary | ICD-10-CM | POA: Diagnosis not present

## 2014-08-18 DIAGNOSIS — Z3A13 13 weeks gestation of pregnancy: Secondary | ICD-10-CM | POA: Diagnosis not present

## 2014-08-18 DIAGNOSIS — R519 Headache, unspecified: Secondary | ICD-10-CM

## 2014-08-18 DIAGNOSIS — Z79899 Other long term (current) drug therapy: Secondary | ICD-10-CM | POA: Insufficient documentation

## 2014-08-18 LAB — COMPREHENSIVE METABOLIC PANEL
ALT: 13 U/L (ref 0–35)
AST: 17 U/L (ref 0–37)
Albumin: 3.5 g/dL (ref 3.5–5.2)
Alkaline Phosphatase: 40 U/L (ref 39–117)
Anion gap: 10 (ref 5–15)
BUN: 9 mg/dL (ref 6–23)
CO2: 24 mEq/L (ref 19–32)
Calcium: 9.4 mg/dL (ref 8.4–10.5)
Chloride: 101 mEq/L (ref 96–112)
Creatinine, Ser: 0.66 mg/dL (ref 0.50–1.10)
GFR calc Af Amer: >90 mL/min (ref >90)
GFR calc non Af Amer: >90 mL/min (ref >90)
Glucose, Bld: 78 mg/dL (ref 70–99)
Potassium: 4.3 mEq/L (ref 3.7–5.3)
Sodium: 135 mEq/L — ABNORMAL LOW (ref 137–147)
Total Bilirubin: <0.2 mg/dL — ABNORMAL LOW (ref 0.3–1.2)
Total Protein: 7.4 g/dL (ref 6.0–8.3)

## 2014-08-18 LAB — CBC WITH DIFFERENTIAL/PLATELET
Basophils Absolute: 0 10*3/uL (ref 0.0–0.1)
Basophils Relative: 0 % (ref 0–1)
Eosinophils Absolute: 0.5 10*3/uL (ref 0.0–0.7)
Eosinophils Relative: 9 % — ABNORMAL HIGH (ref 0–5)
HCT: 32.3 % — ABNORMAL LOW (ref 36.0–46.0)
Hemoglobin: 11.3 g/dL — ABNORMAL LOW (ref 12.0–15.0)
Lymphocytes Relative: 35 % (ref 12–46)
Lymphs Abs: 1.8 10*3/uL (ref 0.7–4.0)
MCH: 28.8 pg (ref 26.0–34.0)
MCHC: 35 g/dL (ref 30.0–36.0)
MCV: 82.4 fL (ref 78.0–100.0)
Monocytes Absolute: 0.4 10*3/uL (ref 0.1–1.0)
Monocytes Relative: 7 % (ref 3–12)
Neutro Abs: 2.6 10*3/uL (ref 1.7–7.7)
Neutrophils Relative %: 49 % (ref 43–77)
Platelets: 144 10*3/uL — ABNORMAL LOW (ref 150–400)
RBC: 3.92 MIL/uL (ref 3.87–5.11)
RDW: 12.8 % (ref 11.5–15.5)
WBC: 5.3 10*3/uL (ref 4.0–10.5)

## 2014-08-18 LAB — URINALYSIS, ROUTINE W REFLEX MICROSCOPIC
Bilirubin Urine: NEGATIVE
Glucose, UA: NEGATIVE mg/dL
Ketones, ur: NEGATIVE mg/dL
Leukocytes, UA: NEGATIVE
Nitrite: NEGATIVE
Protein, ur: NEGATIVE mg/dL
Specific Gravity, Urine: 1.003 — ABNORMAL LOW (ref 1.005–1.030)
Urobilinogen, UA: 0.2 mg/dL (ref 0.0–1.0)
pH: 7 (ref 5.0–8.0)

## 2014-08-18 LAB — I-STAT BETA HCG BLOOD, ED (MC, WL, AP ONLY): I-stat hCG, quantitative: >2000 m[IU]/mL — ABNORMAL HIGH (ref <5)

## 2014-08-18 LAB — URINE MICROSCOPIC-ADD ON
RBC / HPF: NONE SEEN RBC/hpf (ref <3)
WBC, UA: NONE SEEN WBC/hpf (ref <3)

## 2014-08-18 MED ORDER — ACETAMINOPHEN 500 MG PO TABS: 500.0000 mg | ORAL_TABLET | Freq: Three times a day (TID) | ORAL | Status: DC | PRN

## 2014-08-18 MED ORDER — ONDANSETRON HCL 4 MG/2ML IJ SOLN
4.0000 mg | Freq: Once | INTRAMUSCULAR | Status: AC
  Administered 2014-08-18: 4 mg via INTRAVENOUS
  Filled 2014-08-18: qty 2

## 2014-08-18 MED ORDER — ACETAMINOPHEN 500 MG PO TABS
1000.0000 mg | ORAL_TABLET | Freq: Once | ORAL | Status: AC
  Administered 2014-08-18: 1000 mg via ORAL
  Filled 2014-08-18: qty 2

## 2014-08-18 MED ORDER — SODIUM CHLORIDE 0.9 % IV BOLUS (SEPSIS)
1000.0000 mL | Freq: Once | INTRAVENOUS | Status: AC
  Administered 2014-08-18: 1000 mL via INTRAVENOUS

## 2014-08-18 NOTE — ED Notes (Signed)
Pt resting and talking on her cell phone at this time

## 2014-08-18 NOTE — ED Notes (Signed)
Pt given a cup of ice water, ok'd by [employer], [employer]

## 2014-08-18 NOTE — Discharge Instructions (Signed)
As discussed, your evaluation today has been largely reassuring.  But, it is important that you monitor your condition carefully, and do not hesitate to return to the ED if you develop new, or concerning changes in your condition. ? ?Otherwise, please follow-up with your physician for appropriate ongoing care. ? ?

## 2014-08-18 NOTE — ED Notes (Signed)
C/o h/a; "it is all over." no blurred vision.

## 2014-08-18 NOTE — ED Notes (Signed)
Pt aware of need of urine specimen, cannot go at this time; pt asking for water; pt given a blanket

## 2014-08-18 NOTE — ED Notes (Signed)
Pt d/c'd from IV, continuous pulse oximetry and blood pressure cuff; pt getting dressed to be discharged home 

## 2014-08-18 NOTE — ED Provider Notes (Signed)
CSN: [id number]     Arrival date & time 08/18/14  [id number] History   First MD Initiated Contact with Patient 08/18/14 [phone]     Chief Complaint  Patient presents with  . Headache      HPI  Patient presents with headache. Patient has a history of episodic headaches. This headache began within the past days, though the absolute time of onset is unclear.  Since onset has been gradually increasing, is diffuse, sore. There is no new visual loss, neck pain, confusion, disorientation, nausea, vomiting. No relief with OTC medication. Patient is generally well. Patient is 13 weeks pertinent, with an unremarkable pregnancy thus far. She specifically denies vaginal pain, discharge, bleeding or urinary complaints.   Past Medical History  Diagnosis Date  . Hepatitis B    Past Surgical History  Procedure Laterality Date  . No past surgeries     No family history on file. History  Substance Use Topics  . Smoking status: Never Smoker   . Smokeless tobacco: Never Used  . Alcohol Use: No   OB History    Gravida Para Term Preterm AB TAB SAB Ectopic Multiple Living   1              Review of Systems  Constitutional:       Per HPI, otherwise negative  HENT:       Per HPI, otherwise negative  Respiratory:       Per HPI, otherwise negative  Cardiovascular:       Per HPI, otherwise negative  Gastrointestinal: Negative for vomiting.  Endocrine:       Negative aside from HPI  Genitourinary:       Neg aside from HPI   Musculoskeletal:       Per HPI, otherwise negative  Skin: Negative.   Neurological: Positive for headaches. Negative for syncope.      Allergies  Review of patient's allergies indicates no known allergies.  Home Medications   Prior to Admission medications   Medication Sig Start Date End Date Taking? Authorizing Provider  butalbital-acetaminophen-caffeine (FIORICET, ESGIC) 50-325-40 MG per tablet Take 1 tablet by mouth every 6 (six) hours as needed for headache.   07/30/14  Yes Historical Provider, MD  FINACEA 15 % cream Apply 1 application topically at bedtime.  03/23/14  Yes Historical Provider, MD  prenatal vitamin w/FE, FA (NATACHEW) 29-1 MG CHEW chewable tablet Chew 1 tablet by mouth daily at 12 noon. 06/23/14  Yes Dorathy KinsmanVirginia Smith, CNM  ibuprofen (ADVIL,MOTRIN) 600 MG tablet Take 1 tablet (600 mg total) by mouth every 6 (six) hours as needed. Patient not taking: Reported on 08/18/2014 06/28/14   Archie PattenNatalie K Frazier, CNM   BP 111/71 mmHg  Pulse 72  Temp(Src) 98.4 F (36.9 C) (Oral)  Resp 18  SpO2 100%  LMP 05/12/2014 Physical Exam  Constitutional: She is oriented to person, place, and time. She appears well-developed and well-nourished. No distress.  HENT:  Head: Normocephalic and atraumatic.  Eyes: Conjunctivae and EOM are normal.  Cardiovascular: Normal rate and regular rhythm.   Pulmonary/Chest: Effort normal and breath sounds normal. No stridor. No respiratory distress.  Abdominal: Soft. She exhibits no distension. There is no tenderness.  Musculoskeletal: She exhibits no edema.  Neurological: She is alert and oriented to person, place, and time. She displays no atrophy and no tremor. No cranial nerve deficit. She exhibits normal muscle tone. She displays no seizure activity.  Skin: Skin is warm and dry.  Psychiatric: She has a  normal mood and affect.  Nursing note and vitals reviewed.   ED Course  Procedures (including critical care time) Labs Review Labs Reviewed  COMPREHENSIVE METABOLIC PANEL - Abnormal; Notable for the following:    Sodium 135 (*)    Total Bilirubin <0.2 (*)    All other components within normal limits  CBC WITH DIFFERENTIAL - Abnormal; Notable for the following:    Hemoglobin 11.3 (*)    HCT 32.3 (*)    Platelets 144 (*)    Eosinophils Relative 9 (*)    All other components within normal limits  URINALYSIS, ROUTINE W REFLEX MICROSCOPIC - Abnormal; Notable for the following:    Specific Gravity, Urine 1.003 (*)     Hgb urine dipstick TRACE (*)    All other components within normal limits  I-STAT BETA HCG BLOOD, ED ([facility], [facility], [facility] ONLY) - Abnormal; Notable for the following:    I-stat hCG, quantitative >2000.0 (*)    All other components within normal limits  URINE MICROSCOPIC-ADD ON    12:56 PM On repeat exam the patient is sitting upright, using a telephone. No new complaints or ongoing pain. Patient will follow up with her obstetrician.  MDM  This patient G1 P0 at approximately 13 weeks now presents with ongoing headache.  Patient has no neurologic dysfunction. There is no evidence for eclampsia, preeclampsia, bacteremia, sepsis. Patient is a history of recurrent headaches, and her current pregnancy, case management somewhat.  However, the patient's resolution of symptoms, was appropriate for discharge with obstetrician follow-up.  Gerhard Munchobert Emmelina Mcloughlin, MD 08/18/14 1259

## 2014-09-17 ENCOUNTER — Ambulatory Visit: Payer: BC Managed Care – PPO | Admitting: Internal Medicine

## 2014-09-21 ENCOUNTER — Inpatient Hospital Stay (HOSPITAL_COMMUNITY)
Admission: AD | Admit: 2014-09-21 | Discharge: 2014-09-21 | Disposition: A | Discharge status: Home / Self Care | Payer: BC Managed Care – PPO | Source: Ambulatory Visit | Attending: Family Medicine | Admitting: Family Medicine

## 2014-09-21 ENCOUNTER — Encounter (HOSPITAL_COMMUNITY): Payer: Self-pay

## 2014-09-21 DIAGNOSIS — R102 Pelvic and perineal pain: Secondary | ICD-10-CM

## 2014-09-21 DIAGNOSIS — Z3A18 18 weeks gestation of pregnancy: Secondary | ICD-10-CM | POA: Insufficient documentation

## 2014-09-21 DIAGNOSIS — O9989 Other specified diseases and conditions complicating pregnancy, childbirth and the puerperium: Secondary | ICD-10-CM | POA: Insufficient documentation

## 2014-09-21 DIAGNOSIS — N949 Unspecified condition associated with female genital organs and menstrual cycle: Secondary | ICD-10-CM | POA: Diagnosis not present

## 2014-09-21 DIAGNOSIS — G44229 Chronic tension-type headache, not intractable: Secondary | ICD-10-CM | POA: Insufficient documentation

## 2014-09-21 DIAGNOSIS — O26899 Other specified pregnancy related conditions, unspecified trimester: Secondary | ICD-10-CM

## 2014-09-21 DIAGNOSIS — R109 Unspecified abdominal pain: Secondary | ICD-10-CM | POA: Diagnosis present

## 2014-09-21 HISTORY — DX: Headache: R51

## 2014-09-21 HISTORY — DX: Headache, unspecified: R51.9

## 2014-09-21 LAB — URINALYSIS, ROUTINE W REFLEX MICROSCOPIC
Bilirubin Urine: NEGATIVE
Glucose, UA: NEGATIVE mg/dL
Hgb urine dipstick: NEGATIVE
Ketones, ur: NEGATIVE mg/dL
Leukocytes, UA: NEGATIVE
Nitrite: NEGATIVE
Protein, ur: NEGATIVE mg/dL
Specific Gravity, Urine: 1.01 (ref 1.005–1.030)
Urobilinogen, UA: 0.2 mg/dL (ref 0.0–1.0)
pH: 7 (ref 5.0–8.0)

## 2014-09-21 MED ORDER — OXYCODONE HCL 5 MG PO TABS
5.0000 mg | ORAL_TABLET | Freq: Once | ORAL | Status: AC
  Administered 2014-09-21: 5 mg via ORAL
  Filled 2014-09-21: qty 1

## 2014-09-21 NOTE — MAU Note (Signed)
Pt reports pain in lower abd pain and headache for "quite a long time", denies dysuria and bleeding.

## 2014-09-21 NOTE — MAU Provider Note (Signed)
Chief Complaint: Abdominal Pain and Headache   First Provider Initiated Contact with Patient 09/21/14 2132     SUBJECTIVE HPI: Robin Nguyen is a 23 y.o. G1P0 at 4472w6d who presents with 1 week history of diffuse lower quadrant abdominal pain. The pain is dull and constant but waxes and wanes. It is exacerbated by fetal movement and by her position changes and movements.  She is also concerned about a headache that is been present for about 6 months. It occurs daily and she wakes with a headache. Feels like constant pressure throughout her head. She has not treated the headache and was told she cannot take acetaminophen due to Hep B infection. Requests H/A treatment and US.  Pregnancy Course: [facility] from 10 wks significant for Hep B with LFTs nl; and a vasovagal syncopal episode. US scheduled for next week  ROS: Pertinent items in HPI. Negative for fever, N/V/D  Past Medical History  Diagnosis Date  . Hepatitis B   . Headache    OB History  Gravida Para Term Preterm AB SAB TAB Ectopic Multiple Living  1             # Outcome Date GA Lbr Len/2nd Weight Sex Delivery Anes PTL Lv  1 Current              Past Surgical History  Procedure Laterality Date  . No past surgeries     History   Social History  . Marital Status: Single    Spouse Name: N/A    Number of Children: N/A  . Years of Education: N/A   Occupational History  . Not on file.   Social History Main Topics  . Smoking status: Never Smoker   . Smokeless tobacco: Never Used  . Alcohol Use: No  . Drug Use: No  . Sexual Activity:    Partners: Male    Birth Control/ Protection: None   Other Topics Concern  . Not on file   Social History Narrative   No current facility-administered medications on file prior to encounter.   Current Outpatient Prescriptions on File Prior to Encounter  Medication Sig Dispense Refill  . prenatal vitamin w/FE, FA (NATACHEW) 29-1 MG CHEW chewable tablet Chew 1 tablet by  mouth daily at 12 noon. 30 tablet 12  . acetaminophen (TYLENOL) 500 MG tablet Take 1 tablet (500 mg total) by mouth every 8 (eight) hours as needed for headache. (Patient not taking: Reported on 09/21/2014) 30 tablet 0   Allergies  Allergen Reactions  . Tylenol [Acetaminophen] Other (See Comments)    Causes headaches.     OBJECTIVE Blood pressure 107/57, pulse 76, temperature 98.6 F (37 C), temperature source Oral, resp. rate 16, height 5' 2.5" (1.588 m), weight 54.885 kg (121 lb), last menstrual period 05/12/2014, SpO2 100 %. GENERAL: Well-developed, well-nourished female in no acute distress.  HEENT: Normocephalic HEART: normal rate RESP: normal effort ABDOMEN: Soft, Nt except minimally tender to palpation over L and R groin; no guarding or rebound; S=D;  DT FHR 130 EXTREMITIES: Nontender, no edema NEURO: Alert and oriented BIMANUAL: cervix posterior, firm, thick, closded  LAB RESULTS Results for orders placed or performed during the hospital encounter of 09/21/14 (from the past 24 hour(s))  Urinalysis, Routine w reflex microscopic     Status: None   Collection Time: 09/21/14  7:45 PM  Result Value Ref Range   Color, Urine YELLOW YELLOW   APPearance CLEAR CLEAR   Specific Gravity, Urine 1.010 1.005 -  1.030   pH 7.0 5.0 - 8.0   Glucose, UA NEGATIVE NEGATIVE mg/dL   Hgb urine dipstick NEGATIVE NEGATIVE   Bilirubin Urine NEGATIVE NEGATIVE   Ketones, ur NEGATIVE NEGATIVE mg/dL   Protein, ur NEGATIVE NEGATIVE mg/dL   Urobilinogen, UA 0.2 0.0 - 1.0 mg/dL   Nitrite NEGATIVE NEGATIVE   Leukocytes, UA NEGATIVE NEGATIVE    IMAGING No results found.  MAU COURSE Oxycodone 5mg  po given for H/A. Left before assessing effectiveness  ASSESSMENT 1. Chronic tension-type headache, not intractable   2. Pain of round ligament affecting pregnancy, antepartum   G1 at 7929w6d  PLAN Discharge home with reassurance    Medication List    STOP taking these medications         acetaminophen 500 MG tablet  Commonly known as:  TYLENOL      TAKE these medications        prenatal vitamin w/FE, FA 29-1 MG Chew chewable tablet  Chew 1 tablet by mouth daily at 12 noon.       Follow-up Information    Follow up with Myna HidalgoZAN, JENNIFER, M, DO.   Specialty:  Obstetrics and Gynecology   Why:  Keep your scheduled prenatal appointment   Contact information:   [address] [place] [id number] [phone]        Danae OrleansDeirdre C Letia Guidry, CNM 09/21/2014  9:33 PM

## 2014-09-21 NOTE — Discharge Instructions (Signed)
Tension Headache A tension headache is a feeling of pain, pressure, or aching often felt over the front and sides of the head. The pain can be dull or can feel tight (constricting). It is the most common type of headache. Tension headaches are not normally associated with nausea or vomiting and do not get worse with physical activity. Tension headaches can last 30 minutes to several days.  CAUSES  The exact cause is not known, but it may be caused by chemicals and hormones in the brain that lead to pain. Tension headaches often begin after stress, anxiety, or depression. Other triggers may include:  Alcohol.  Caffeine (too much or withdrawal).  Respiratory infections (colds, flu, sinus infections).  Dental problems or teeth clenching.  Fatigue.  Holding your head and neck in one position too long while using a computer. SYMPTOMS   Pressure around the head.   Dull, aching head pain.   Pain felt over the front and sides of the head.   Tenderness in the muscles of the head, neck, and shoulders. DIAGNOSIS  A tension headache is often diagnosed based on:   Symptoms.   Physical examination.   A CT scan or MRI of your head. These tests may be ordered if symptoms are severe or unusual. TREATMENT  Medicines may be given to help relieve symptoms.  HOME CARE INSTRUCTIONS   Only take over-the-counter or prescription medicines for pain or discomfort as directed by your caregiver.   Lie down in a dark, quiet room when you have a headache.   Keep a journal to find out what may be triggering your headaches. For example, write down:  What you eat and drink.  How much sleep you get.  Any change to your diet or medicines.  Try massage or other relaxation techniques.   Ice packs or heat applied to the head and neck can be used. Use these 3 to 4 times per day for 15 to 20 minutes each time, or as needed.   Limit stress.   Sit up straight, and do not tense your muscles.    Quit smoking if you smoke.  Limit alcohol use.  Decrease the amount of caffeine you drink, or stop drinking caffeine.  Eat and exercise regularly.  Get 7 to 9 hours of sleep, or as recommended by your caregiver.  Avoid excessive use of pain medicine as recurrent headaches can occur.  SEEK MEDICAL CARE IF:   You have problems with the medicines you were prescribed.  Your medicines do not work.  You have a change from the usual headache.  You have nausea or vomiting. SEEK IMMEDIATE MEDICAL CARE IF:   Your headache becomes severe.  You have a fever.  You have a stiff neck.  You have loss of vision.  You have muscular weakness or loss of muscle control.  You lose your balance or have trouble walking.  You feel faint or pass out.  You have severe symptoms that are different from your first symptoms. MAKE SURE YOU:   Understand these instructions.  Will watch your condition.  Will get help right away if you are not doing well or get worse. Document Released: 10/02/2005 Document Revised: 12/25/2011 Document Reviewed: 09/22/2011 [facility] Patient Information 2015 [place], [place]. This information is not intended to replace advice given to you by your health care provider. Make sure you discuss any questions you have with your health care provider. Abdominal Pain During Pregnancy Abdominal pain is common in pregnancy. Most of the  time, it does not cause harm. There are many causes of abdominal pain. Some causes are more serious than others. Some of the causes of abdominal pain in pregnancy are easily diagnosed. Occasionally, the diagnosis takes time to understand. Other times, the cause is not determined. Abdominal pain can be a sign that something is very wrong with the pregnancy, or the pain may have nothing to do with the pregnancy at all. For this reason, always tell your health care provider if you have any abdominal discomfort. HOME CARE INSTRUCTIONS  Monitor your  abdominal pain for any changes. The following actions may help to alleviate any discomfort you are experiencing:  Do not have sexual intercourse or put anything in your vagina until your symptoms go away completely.  Get plenty of rest until your pain improves.  Drink clear fluids if you feel nauseous. Avoid solid food as long as you are uncomfortable or nauseous.  Only take over-the-counter or prescription medicine as directed by your health care provider.  Keep all follow-up appointments with your health care provider. SEEK IMMEDIATE MEDICAL CARE IF:  You are bleeding, leaking fluid, or passing tissue from the vagina.  You have increasing pain or cramping.  You have persistent vomiting.  You have painful or bloody urination.  You have a fever.  You notice a decrease in your baby's movements.  You have extreme weakness or feel faint.  You have shortness of breath, with or without abdominal pain.  You develop a severe headache with abdominal pain.  You have abnormal vaginal discharge with abdominal pain.  You have persistent diarrhea.  You have abdominal pain that continues even after rest, or gets worse. MAKE SURE YOU:   Understand these instructions.  Will watch your condition.  Will get help right away if you are not doing well or get worse. Document Released: 10/02/2005 Document Revised: 07/23/2013 Document Reviewed: 05/01/2013 [facility] Patient Information 2015 [place], [place]. This information is not intended to replace advice given to you by your health care provider. Make sure you discuss any questions you have with your health care provider.

## 2014-09-23 ENCOUNTER — Ambulatory Visit: Payer: BC Managed Care – PPO | Admitting: Internal Medicine

## 2014-10-20 ENCOUNTER — Ambulatory Visit: Payer: BC Managed Care – PPO | Admitting: Neurology

## 2014-10-21 ENCOUNTER — Encounter: Payer: Self-pay | Admitting: *Deleted

## 2014-10-21 ENCOUNTER — Telehealth: Payer: Self-pay | Admitting: Neurology

## 2014-10-21 NOTE — Progress Notes (Signed)
No show letter sent for 10/20/2013 no show notification also sent to Dr. Maryan Charzen office.

## 2014-10-21 NOTE — Telephone Encounter (Signed)
Pt no showed NP appt 10/20/14 w/ Dr. Everlena CooperJaffe.   Erica - please send pt a no show letter + copy of our no show policy. Please contact Dr. Lawana Chamberszan's office as well to make them aware / Sherri S.

## 2014-12-07 ENCOUNTER — Encounter: Payer: Self-pay | Admitting: *Deleted

## 2014-12-07 ENCOUNTER — Ambulatory Visit (INDEPENDENT_AMBULATORY_CARE_PROVIDER_SITE_OTHER): Payer: 59 | Admitting: Neurology

## 2014-12-07 ENCOUNTER — Encounter: Payer: Self-pay | Admitting: Neurology

## 2014-12-07 VITALS — BP 88/60 | HR 90 | Temp 97.7°F | Resp 16 | Ht 64.0 in | Wt 122.7 lb

## 2014-12-07 DIAGNOSIS — G43009 Migraine without aura, not intractable, without status migrainosus: Secondary | ICD-10-CM

## 2014-12-07 NOTE — Progress Notes (Signed)
NEUROLOGY CONSULTATION NOTE  Robin HasteJeneba Layton MRN: [id number] DOB: September 09, 1991  Referring provider: Dr. Charlotta Newtonzan Primary care provider: Dr. Charlotta Newtonzan  Reason for consult:  Headache  HISTORY OF PRESENT ILLNESS: Robin Nguyen is a 24 year old right-handed woman who is is [redacted] weeks gestation with her first pregnancy and with chronic hepatitis B and migraine who presents for migraine.  Records reviewed.  Onset:  Fall of 2014.  It resolved and then recurred in April.   Location:  Holocephalic and neck Quality:  sharp Intensity:  8/10 Aura:  no Prodrome:  Feels cold Associated symptoms:  Photophobia, phonophobia.  Sometimes, she feels lightheaded and has passed out.  No nausea or visual disturbance. Duration:  20-30 minutes Frequency:  At beginning of pregnancy, it became daily.  It has resolved since December Triggers/exacerbating factors:  none Relieving factors:  none  Past abortive therapy:  none Past preventative therapy:  Trigger point injections. She is a patient of Dr[facility]Freeman at [facility].  She changed insurance and needed another referral from her PCP to see him.  She plans on making another appointment.  Current abortive therapy:  None.  She is allergic to Tylenol Current preventative therapy:  none  She has not had any imaging of the brain to evaluate headaches.  PAST MEDICAL HISTORY: Past Medical History  Diagnosis Date  . Hepatitis B   . Headache     PAST SURGICAL HISTORY: Past Surgical History  Procedure Laterality Date  . No past surgeries      MEDICATIONS: Current Outpatient Prescriptions on File Prior to Visit  Medication Sig Dispense Refill  . prenatal vitamin w/FE, FA (NATACHEW) 29-1 MG CHEW chewable tablet Chew 1 tablet by mouth daily at 12 noon. 30 tablet 12   No current facility-administered medications on file prior to visit.    ALLERGIES: Allergies  Allergen Reactions  . Tylenol [Acetaminophen] Other (See Comments)    Causes  headaches.    FAMILY HISTORY: Family History  Problem Relation Age of Onset  . Hypertension Mother     SOCIAL HISTORY: History   Social History  . Marital Status: Single    Spouse Name: N/A  . Number of Children: N/A  . Years of Education: N/A   Occupational History  . Not on file.   Social History Main Topics  . Smoking status: Never Smoker   . Smokeless tobacco: Never Used  . Alcohol Use: No  . Drug Use: No  . Sexual Activity:    Partners: Male    Birth Control/ Protection: None   Other Topics Concern  . Not on file   Social History Narrative    REVIEW OF SYSTEMS: Constitutional: No fevers, chills, or sweats, no generalized fatigue, change in appetite Eyes: No visual changes, double vision, eye pain Ear, nose and throat: No hearing loss, ear pain, nasal congestion, sore throat Cardiovascular: No chest pain, palpitations Respiratory:  No shortness of breath at rest or with exertion, wheezes GastrointestinaI: No nausea, vomiting, diarrhea, abdominal pain, fecal incontinence Genitourinary:  No dysuria, urinary retention or frequency Musculoskeletal:  No neck pain, back pain Integumentary: No rash, pruritus, skin lesions Neurological: as above Psychiatric: No depression, insomnia, anxiety Endocrine: No palpitations, fatigue, diaphoresis, mood swings, change in appetite, change in weight, increased thirst Hematologic/Lymphatic:  No anemia, purpura, petechiae. Allergic/Immunologic: no itchy/runny eyes, nasal congestion, recent allergic reactions, rashes  PHYSICAL EXAM: Filed Vitals:   12/07/14 0918  BP: 88/60  Pulse: 90  Temp: 97.7 F (36.5 C)  Resp: 16   General: No acute distress Head:  Normocephalic/atraumatic Eyes:  fundi unremarkable, without vessel changes, exudates, hemorrhages or papilledema. Neck: supple, no paraspinal tenderness, full range of motion Back: No paraspinal tenderness Heart: regular rate and rhythm Lungs: Clear to auscultation  bilaterally. Vascular: No carotid bruits. Neurological Exam: Mental status: alert and oriented to person, place, and time, recent and remote memory intact, fund of knowledge intact, attention and concentration intact, speech fluent and not dysarthric, language intact. Cranial nerves: CN I: not tested CN II: pupils equal, round and reactive to light, visual fields intact, fundi unremarkable, without vessel changes, exudates, hemorrhages or papilledema. CN III, IV, VI:  full range of motion, no nystagmus, no ptosis CN V: facial sensation intact CN VII: upper and lower face symmetric CN VIII: hearing intact CN IX, X: gag intact, uvula midline CN XI: sternocleidomastoid and trapezius muscles intact CN XII: tongue midline Bulk & Tone: normal, no fasciculations. Motor:  5/5 throughout Sensation:  Temperature and vibration intact Deep Tendon Reflexes:  2+ throughout, toes downgoing Finger to nose testing:  No dysmetria Heel to shin:  No dysmetria Gait:  Normal station and stride.  Able to turn and walk in tandem. Romberg negative.  IMPRESSION: Migraine without aura.  It is probably migraine although it does not completely fit criteria for migraine.  PLAN: 1.  Since she has another neurologist who treats her migraine, I will have her follow up with Dr. Neale Burly to continue care for her headaches. 2.  If headaches should recur, may consider supplemental magnesium citrate  twice daily  45 minutes spent with patient, over 50% spent discussing management.  Thank you for allowing me to take part in the care of this patient.  Shon Millet, DO  CC:  Myna Hidalgo, MD

## 2014-12-07 NOTE — Patient Instructions (Signed)
I wouldn't start anything now since the headaches have improved. 1.  Make a follow up appointment with Dr[facility]Freeman at [facility] 2.  If headaches do start to increase, consider magnesium citrate 200mg  twice daily

## 2014-12-08 ENCOUNTER — Encounter (HOSPITAL_COMMUNITY): Payer: Self-pay | Admitting: General Practice

## 2014-12-08 ENCOUNTER — Inpatient Hospital Stay (HOSPITAL_COMMUNITY)
Admission: AD | Admit: 2014-12-08 | Discharge: 2014-12-08 | Disposition: A | Discharge status: Home / Self Care | Payer: 59 | Source: Ambulatory Visit | Attending: Obstetrics & Gynecology | Admitting: Obstetrics & Gynecology

## 2014-12-08 DIAGNOSIS — O4403 Placenta previa specified as without hemorrhage, third trimester: Secondary | ICD-10-CM

## 2014-12-08 DIAGNOSIS — R51 Headache: Secondary | ICD-10-CM | POA: Diagnosis not present

## 2014-12-08 DIAGNOSIS — O26899 Other specified pregnancy related conditions, unspecified trimester: Secondary | ICD-10-CM

## 2014-12-08 DIAGNOSIS — Z3A30 30 weeks gestation of pregnancy: Secondary | ICD-10-CM | POA: Diagnosis not present

## 2014-12-08 DIAGNOSIS — R519 Headache, unspecified: Secondary | ICD-10-CM

## 2014-12-08 DIAGNOSIS — R109 Unspecified abdominal pain: Secondary | ICD-10-CM | POA: Diagnosis present

## 2014-12-08 DIAGNOSIS — O4413 Placenta previa with hemorrhage, third trimester: Secondary | ICD-10-CM

## 2014-12-08 DIAGNOSIS — O9989 Other specified diseases and conditions complicating pregnancy, childbirth and the puerperium: Secondary | ICD-10-CM | POA: Diagnosis not present

## 2014-12-08 LAB — URINALYSIS, ROUTINE W REFLEX MICROSCOPIC
Bilirubin Urine: NEGATIVE
Glucose, UA: NEGATIVE mg/dL
Hgb urine dipstick: NEGATIVE
Ketones, ur: NEGATIVE mg/dL
Leukocytes, UA: NEGATIVE
Nitrite: NEGATIVE
Protein, ur: NEGATIVE mg/dL
Specific Gravity, Urine: 1.01 (ref 1.005–1.030)
Urobilinogen, UA: 0.2 mg/dL (ref 0.0–1.0)
pH: 7.5 (ref 5.0–8.0)

## 2014-12-08 LAB — WET PREP, GENITAL
Clue Cells Wet Prep HPF POC: NONE SEEN
Trich, Wet Prep: NONE SEEN
Yeast Wet Prep HPF POC: NONE SEEN

## 2014-12-08 MED ORDER — CYCLOBENZAPRINE HCL 10 MG PO TABS
10.0000 mg | ORAL_TABLET | ORAL | Status: AC
  Administered 2014-12-08: 10 mg via ORAL
  Filled 2014-12-08: qty 1

## 2014-12-08 MED ORDER — CYCLOBENZAPRINE HCL 10 MG PO TABS: 10.0000 mg | ORAL_TABLET | Freq: Three times a day (TID) | ORAL | Status: DC | PRN

## 2014-12-08 NOTE — Progress Notes (Signed)
   12/08/14 1200  Clinical Encounter Type  Visited With Patient;Patient and family together (mostly privately, but also visited with Carlyon Shadowgrandma Robin Nguyen)  Visit Type Spiritual support;Social support  Referral From Nurse;Social work  Spiritual Encounters  Spiritual Needs Emotional;Prayer  Stress Factors  Patient Stress Factors Family relationships (hx emotional trauma/abandonment, plus FOB not involved now)   Followed visit by Loleta BooksSarah Venning, LCSW for spiritual and emotional support.  [name] (pronounced Jen-A-buh) shared that her mother left her when she was 76 months old; since that time pt's grandmother has raised her.  Per pt, FOB is not interested in pregnancy and not involved; this stirs up her past grief and pain about abandonment--at the same time that she herself is becoming a mother.  Per pt, God and grandma are top sources of support and meeting. Per pt, grandma is Muslim, praying 5x daily and going to mosque; pt herself identifies as Muslim, has in the past connected with people in a church community; and regularly prays on her own now.  [name] understands her baby as a gift from God and a big source of joy.  She remains in touch with friends and family, especially aunties who are very supportive, [place].  Provided pastoral presence, reflective listening, normalization of mixed feelings, emotional support, and prayer.  Encouraged [name] and [name] to reach out when they return for delivery.  Family very appreciative.  [address]Chaplain Herby Amick [place], [place] [phone]

## 2014-12-08 NOTE — MAU Note (Signed)
Social work in room speaking with the patient

## 2014-12-08 NOTE — Discharge Instructions (Signed)
Placenta Previa  Placenta previa is a condition in pregnant women where the placenta implants in the lower part of the uterus. The placenta either partially or completely covers the opening to the cervix. This is a problem because the baby must pass through the cervix during delivery. There are three types of placenta previa. They include:   Marginal placenta previa. The placenta is near the cervix, but does not cover the opening.  Partial placenta previa. The placenta covers part of the cervical opening.  Complete placenta previa. The placenta covers the entire cervical opening.  Depending on the type of placenta previa, there is a chance the placenta may move into a normal position and no longer cover the cervix as the pregnancy progresses. It is important to keep all prenatal visits with your caregiver.  RISK FACTORS You may be more likely to develop placenta previa if you:   Are carrying more than one baby (multiples).   Have an abnormally shaped uterus.   Have scars on the lining of the uterus.   Had previous surgeries involving the uterus, such as a cesarean delivery.   Have delivered a baby previously.   Have a history of placenta previa.   Have smoked or used cocaine during pregnancy.   Are age 24 or older during pregnancy.  SYMPTOMS The main symptom of placenta previa is sudden, painless vaginal bleeding during the second half of pregnancy. The amount of bleeding can be light to very heavy. The bleeding may stop on its own, but almost always returns. Cramping, regular contractions, abdominal pain, and lower back pain can also occur with placenta previa.  DIAGNOSIS Placenta previa can be diagnosed through an ultrasound by finding where the placenta is located. The ultrasound may find placenta previa either during a routine prenatal visit or after vaginal bleeding is noticed. If you are diagnosed with placenta previa, your caregiver may avoid vaginal exams to reduce the  risk of heavy bleeding. There is a chance that placenta previa may not be diagnosed until bleeding occurs during labor.  TREATMENT Specific treatment depends on:   How much you are bleeding or if the bleeding has stopped.  How far along you are in your pregnancy.   The condition of the baby.   The location of the baby and placenta.   The type of placenta previa.  Depending on the factors above, your caregiver may recommend:   Decreased activity.   Bed rest at home or in the hospital.  Pelvic rest. This means no sex, using tampons, douching, pelvic exams, or placing anything into the vagina.  A blood transfusion to replace maternal blood loss.  A cesarean delivery if the bleeding is heavy and cannot be controlled or the placenta completely covers the cervix.  Medication to stop premature labor or mature the fetal lungs if delivery is needed before the pregnancy is full term.  WHEN SHOULD YOU SEEK IMMEDIATE MEDICAL CARE IF YOU ARE SENT HOME WITH PLACENTA PREVIA? Seek immediate medical care if you show any symptoms of placenta previa. You will need to go to the hospital to get checked immediately. Again, those symptoms are:  Sudden, painless vaginal bleeding, even a small amount.  Cramping or regular contractions.  Lower back or abdominal pain. Document Released: 10/02/2005 Document Revised: 06/04/2013 Document Reviewed: 01/03/2013 [facility] Patient Information 2015 [place], [place]. This information is not intended to replace advice given to you by your health care provider. Make sure you discuss any questions you have with your health  care provider.  Pelvic Rest Pelvic rest is sometimes recommended for women when:   The placenta is partially or completely covering the opening of the cervix (placenta previa).  There is bleeding between the uterine wall and the amniotic sac in the first trimester (subchorionic hemorrhage).  The cervix begins to open without labor starting  (incompetent cervix, cervical insufficiency).  The labor is too early (preterm labor). HOME CARE INSTRUCTIONS  Do not have sexual intercourse, stimulation, or an orgasm.  Do not use tampons, douche, or put anything in the vagina.  Do not lift anything over 10 pounds (4.5 kg).  Avoid strenuous activity or straining your pelvic muscles. SEEK MEDICAL CARE IF:  You have any vaginal bleeding during pregnancy. Treat this as a potential emergency.  You have cramping pain felt low in the stomach (stronger than menstrual cramps).  You notice vaginal discharge (watery, mucus, or bloody).  You have a low, dull backache.  There are regular contractions or uterine tightening. SEEK IMMEDIATE MEDICAL CARE IF: You have vaginal bleeding and have placenta previa.  Document Released: 01/27/2011 Document Revised: 12/25/2011 Document Reviewed: 01/27/2011 [facility] Patient Information 2015 [place]. This information is not intended to replace advice given to you by your health care provider. Make sure you discuss any questions you have with your health care provider.

## 2014-12-08 NOTE — MAU Note (Signed)
Pt states her headache and abdominal pain are a little bit better

## 2014-12-08 NOTE — MAU Note (Signed)
Message left for Huntley DecSara, the social worker to come see the patient regarding statements of suicidal thoughts & depression.

## 2014-12-08 NOTE — MAU Note (Signed)
Misty StanleyLisa, chaplain here to speak with patient

## 2014-12-08 NOTE — MAU Provider Note (Signed)
History     CSN: [id number]  Arrival date and time: 12/08/14 0865   First Provider Initiated Contact with Patient 12/08/14 1001      Chief Complaint  Patient presents with  . Headache  . Abdominal Pain   HPI Robin Nguyen 24 y.o. G1P0  presents to MAU complaining of headache and abdominal pain.  She has been having ongoing headaches for several months.  She has not used any medication to treat this.  The pain is 8/10, pulsating, increased with movement, associated with photo and phonophobia.  She does not have nausea or vomiting at present.  She reports "passing out" at work around 7am today although she notes she did not fall and did not hit her head or abdomen.  She states she had a headache and dizziness.  She went in to work at [employer] and had rice and cereal to eat about that time.   She also complains of abdominal pain, 7/10 that has been constant since last night.  She states eating does not change the pain.  It feels crampy.  No nausea/vomiting/diarrhea/constipation.  She endorses good fetal movement and denies LOF, vaginal bleeding or dysuria.  She does report white vaginal discharge.   OB History    Gravida Para Term Preterm AB TAB SAB Ectopic Multiple Living   1               Past Medical History  Diagnosis Date  . Hepatitis B   . Headache     Past Surgical History  Procedure Laterality Date  . No past surgeries      Family History  Problem Relation Age of Onset  . Hypertension Mother     History  Substance Use Topics  . Smoking status: Never Smoker   . Smokeless tobacco: Never Used  . Alcohol Use: No    Allergies:  Allergies  Allergen Reactions  . Tylenol [Acetaminophen] Other (See Comments)    Causes headaches.    Prescriptions prior to admission  Medication Sig Dispense Refill Last Dose  . prenatal vitamin w/FE, FA (NATACHEW) 29-1 MG CHEW chewable tablet Chew 1 tablet by mouth daily at 12 noon. 30 tablet 12 Taking    ROS Pertinent ROS in  HPI  Physical Exam   Blood pressure 104/68, pulse 85, temperature 97.4 F (36.3 C), temperature source Oral, resp. rate 18, last menstrual period 05/12/2014.  Physical Exam  Constitutional: She is oriented to person, place, and time. She appears well-developed and well-nourished. No distress.  HENT:  Head: Normocephalic and atraumatic.  Eyes: EOM are normal.  Neck: Normal range of motion.  Cardiovascular: Normal rate and regular rhythm.   Respiratory: Effort normal and breath sounds normal.  GI: Soft. Bowel sounds are normal. She exhibits no distension. There is no rebound and no guarding.  Pt reports tenderness every place she is palpated, although her facial expression never changes during this part of exam.  Musculoskeletal: Normal range of motion.  Neurological: She is alert and oriented to person, place, and time.  Skin: Skin is warm and dry.  Psychiatric: She has a normal mood and affect.   Fetal tracing is reassuring.  Toco with signficant irritability MAU Course  Procedures  MDM Nurse Elnita Maxwell reports pt admitted to suicidal thoughts.  SW consult ordered.   Flexeril ordered for HA.    Consult with Dr. Charlotta Newton.  Discussed HA, abdominal pain, uterine irritability and ?suicidal thoughts.  Pt has known previa.  NO cervical check to be done.  Visual inspection is sufficient.  Flexeril is acceptable therapy along with pushing po hydration.   SW consult is ongoing at present.  Dr. Charlotta Newtonzan does not expect pt to be suicidal as she knows pt well.  Dr. Charlotta Newtonzan is in agreement to discharge pt pending approval with SW.  Have pt f/u in office.     Assessment and Plan  A: Known placenta previa, Headache, abdominal pain in pregnancy  P: Discharge to home Pelvic rest Flexeril prn HA F/u with Dr. Neale BurlyFreeman for further HA management F/u in clinic with Dr. Charlotta Newtonzan for pregnancy management Patient may return to MAU as needed or if her condition were to change or worsen   Bertram Denvereague Clark, Karen  E 12/08/2014, 10:08 AM

## 2014-12-08 NOTE — Progress Notes (Signed)
CSW received referral due to patient expressing feelings of depression and suicidal thoughts to RN.   CSW arrived to the MAU, and the patient's grandmother was at beside.  The patient expressed preference for her grandmother to leave for the assessment, and the grandmother was receptive to the patient's request.  The patient presented as easily engaged and receptive to the visit.  She displayed a full range in affect.  At the beginning of the visit, she was tearful and had a flat affect; however, by the end of the visit, she was observed to be smiling and laughing.  The patient was able to engage in a linear and goal orientated conversation, and she also was noted to have a future oriented thought process.  Patient does report feeling stressed and overwhelmed as she prepares to transition to motherhood; however, she firmly denied suicidal thoughts or belief that she could ever kill herself.   With minimal prompting, the patient began discussing her feelings of depression and sadness.  The CSW assisted the MOB to process her thoughts and feelings by providing supportive listening and validating her feelings.  The patient reflected upon the experiences of the FOB being minimally receptive and supportive of the pregnancy, including his request that she have an abortion (which she stated she is against).  She shared a belief that she has minimal support from her family, due to a history of a strained relationship with her mother. The patient discussed that she has a supportive relationship with her grandmother, but reflected upon difficulties genuinely expressing how she feels since the grandmother has told the patient that she needs to be "strong".  The patient's comments also highlight that the grandmother may be minimizing the patient's feelings.   The patient reflected upon a long history of depression, stemming from childhood. She discussed abandonment in [place] at age 14 months from her mother.  Her comments  highlight that these feelings may have been re-triggered since she also perceives abandonment from the FOB when she became pregnancy.  The patient also appears to be reflecting upon the relationship with her mother as she anticipates her own transition to motherhood.  The patient also indicated that she is re-living her feelings associated with a poor relationship with her mother. The patient discussed feeling as if she does not "belong", which she reported has been a life long struggle since she has moved back and forth between the [place] and [place].    When examining feelings of depression, she reported that she cries "everyday".  She shared that when she cries, she can cry for 1-2 hours per day.  The patient shared that she has a decreased appetite and decreased ability to sleep (limited sleep may also be linked with pregnancy).  Despite these feelings, the patient reported that she is still able to engage in activities of daily living, including showering, household chores, and going to work.   The patient denied current suicidal thoughts or feelings of wanting to die. She stated that she has thought about wanting to die as a child, but stated that the last time she wanted to die was at age 78.  She endorsed feeling hopeless and overwhelmed, but when asked if she has ever wanted to fall asleep and never wake up, she reported, "I want to wake up!".  The patient reported desire to live since if she were to die, she would miss out on opportunities, including God's plans for her. She also stated that she wants to lie  since she wants an opportunity to be a mother.  The patient reported a desire for her depression to disappear.  When asked the miracle question, she reported that she wished she could feel excited and happy.  The patient reported belief that only God is able to help her reach these goals, and she reflected upon her daily practices of prayer that have helped her through her sadness.  The  patient also acknowledged that she is able to listen to music, go for walks, and talk with her grandmother in order to also help her feeling happy again.    The patient denied previous participation in therapy.  She shared that she is interested in therapy since she finds it beneficial for her to talk about her feelings.  She shared that she often finds it difficult to talk to her family and friends since she believes that her support system likes to gossip. The patient was receptive to referrals for therapy that were provided by the CSW.  The CSW also discussed the [facility] support group. She also expressed interest in this resource since she believes it may be helpful for her to be able to talk to other women who are also experiencing a similar situation.   CSW also reviewed crisis services, including mobile crisis, calling 911, and visiting any hospital ED in the event that she feels like she wants to die.  The patient expressed appreciation for the visit and the information.    CSW contacted Chaplain in order to coordinate care.  Patient was introduced to the role of the Chaplain, and the patient requested Chaplain visit.

## 2014-12-08 NOTE — MAU Note (Signed)
Pt presents to MAU with c/o a headache since this morning that she did not take tylenol for.  Pt also has c/o abdominal pain since yesterday. Pt states her abdomen feels crampy.

## 2014-12-08 NOTE — Progress Notes (Signed)
   12/08/14 1000  Clinical Encounter Type  Referral From Nurse Elnita Maxwell(Cheryl, MAU)   Received referral from RN due to reported suicidal thoughts; will defer to SW for initial consult.   Please page when chaplain f/u needed/appropriate:  [phone].  Thank you.  [address]Chaplain Kashif Pooler [place], [place] [phone]

## 2014-12-08 NOTE — MAU Note (Signed)
Pt states her pain is better.

## 2015-01-10 ENCOUNTER — Encounter (HOSPITAL_COMMUNITY): Payer: Self-pay | Admitting: Obstetrics and Gynecology

## 2015-01-10 ENCOUNTER — Inpatient Hospital Stay (HOSPITAL_COMMUNITY)
Admission: AD | Admit: 2015-01-10 | Discharge: 2015-01-10 | Disposition: A | Discharge status: Home / Self Care | Payer: 59 | Source: Ambulatory Visit | Attending: Obstetrics & Gynecology | Admitting: Obstetrics & Gynecology

## 2015-01-10 DIAGNOSIS — O26893 Other specified pregnancy related conditions, third trimester: Secondary | ICD-10-CM | POA: Diagnosis not present

## 2015-01-10 DIAGNOSIS — Z3A34 34 weeks gestation of pregnancy: Secondary | ICD-10-CM | POA: Insufficient documentation

## 2015-01-10 DIAGNOSIS — N898 Other specified noninflammatory disorders of vagina: Secondary | ICD-10-CM | POA: Insufficient documentation

## 2015-01-10 DIAGNOSIS — O4400 Placenta previa specified as without hemorrhage, unspecified trimester: Secondary | ICD-10-CM

## 2015-01-10 DIAGNOSIS — O9989 Other specified diseases and conditions complicating pregnancy, childbirth and the puerperium: Secondary | ICD-10-CM | POA: Diagnosis not present

## 2015-01-10 HISTORY — DX: Complete placenta previa nos or without hemorrhage, unspecified trimester: O44.00

## 2015-01-10 LAB — URINALYSIS, ROUTINE W REFLEX MICROSCOPIC
Bilirubin Urine: NEGATIVE
Glucose, UA: NEGATIVE mg/dL
Hgb urine dipstick: NEGATIVE
Ketones, ur: NEGATIVE mg/dL
Leukocytes, UA: NEGATIVE
Nitrite: NEGATIVE
Protein, ur: NEGATIVE mg/dL
Specific Gravity, Urine: 1.01 (ref 1.005–1.030)
Urobilinogen, UA: 0.2 mg/dL (ref 0.0–1.0)
pH: 7.5 (ref 5.0–8.0)

## 2015-01-10 LAB — WET PREP, GENITAL
Clue Cells Wet Prep HPF POC: NONE SEEN
Trich, Wet Prep: NONE SEEN
Yeast Wet Prep HPF POC: NONE SEEN

## 2015-01-10 NOTE — Discharge Instructions (Signed)
Third Trimester of Pregnancy The third trimester is from week 29 through week 42, months 7 through 9. This trimester is when your unborn baby (fetus) is growing very fast. At the end of the ninth month, the unborn baby is about 20 inches in length. It weighs about 6-10 pounds.  HOME CARE   Avoid all smoking, herbs, and alcohol. Avoid drugs not approved by your doctor.  Only take medicine as told by your doctor. Some medicines are safe and some are not during pregnancy.  Exercise only as told by your doctor. Stop exercising if you start having cramps.  Eat regular, healthy meals.  Wear a good support bra if your breasts are tender.  Do not use hot tubs, steam rooms, or saunas.  Wear your seat belt when driving.  Avoid raw meat, uncooked cheese, and liter boxes and soil used by cats.  Take your prenatal vitamins.  Try taking medicine that helps you poop (stool softener) as needed, and if your doctor approves. Eat more fiber by eating fresh fruit, vegetables, and whole grains. Drink enough fluids to keep your pee (urine) clear or pale yellow.  Take warm water baths (sitz baths) to soothe pain or discomfort caused by hemorrhoids. Use hemorrhoid cream if your doctor approves.  If you have puffy, bulging veins (varicose veins), wear support hose. Raise (elevate) your feet for 15 minutes, 3-4 times a day. Limit salt in your diet.  Avoid heavy lifting, wear low heels, and sit up straight.  Rest with your legs raised if you have leg cramps or low back pain.  Visit your dentist if you have not gone during your pregnancy. Use a soft toothbrush to brush your teeth. Be gentle when you floss.  You can have sex (intercourse) unless your doctor tells you not to.  Do not travel far distances unless you must. Only do so with your doctor's approval.  Take prenatal classes.  Practice driving to the hospital.  Pack your hospital bag.  Prepare the baby's room.  Go to your doctor visits. GET  HELP IF:  You are not sure if you are in labor or if your water has broken.  You are dizzy.  You have mild cramps or pressure in your lower belly (abdominal).  You have a nagging pain in your belly area.  You continue to feel sick to your stomach (nauseous), throw up (vomit), or have watery poop (diarrhea).  You have bad smelling fluid coming from your vagina.  You have pain with peeing (urination). GET HELP RIGHT AWAY IF:   You have a fever.  You are leaking fluid from your vagina.  You are spotting or bleeding from your vagina.  You have severe belly cramping or pain.  You lose or gain weight rapidly.  You have trouble catching your breath and have chest pain.  You notice sudden or extreme puffiness (swelling) of your face, hands, ankles, feet, or legs.  You have not felt the baby move in over an hour.  You have severe headaches that do not go away with medicine.  You have vision changes. Document Released: 12/27/2009 Document Revised: 01/27/2013 Document Reviewed: 12/03/2012 [employer] Patient Information 2015 [employer]. This information is not intended to replace advice given to you by your health care provider. Make sure you discuss any questions you have with your health care provider.  

## 2015-01-10 NOTE — MAU Provider Note (Signed)
History     CSN: [id number]  Arrival date and time: 01/10/15 [id number]   First Provider Initiated Contact with Patient 01/10/15 1655      Chief Complaint  Patient presents with  . Vaginal Discharge  . Abdominal Pain   HPI  Robin Nguyen is 24 y.o. G1P0 10571w5d weeks presenting with complaint of vaginal discharge.  It began last night and denies any seen today.  Neg for vaginal itching, burning or pain   Denies loss of fluid, vaginal bleeding, and contractions.  Last intercourse 2 nights ago.   Past Medical History  Diagnosis Date  . Hepatitis B   . Headache   . Placenta previa 01/10/2015    Past Surgical History  Procedure Laterality Date  . No past surgeries      Family History  Problem Relation Age of Onset  . Hypertension Mother     History  Substance Use Topics  . Smoking status: Never Smoker   . Smokeless tobacco: Never Used  . Alcohol Use: No    Allergies:  Allergies  Allergen Reactions  . Tylenol [Acetaminophen] Other (See Comments)    Reaction:  Headaches    Prescriptions prior to admission  Medication Sig Dispense Refill Last Dose  . prenatal vitamin w/FE, FA (NATACHEW) 29-1 MG CHEW chewable tablet Chew 1 tablet by mouth daily at 12 noon. 30 tablet 12 01/09/2015 at Unknown time  . cyclobenzaprine (FLEXERIL) 10 MG tablet Take 1 tablet (10 mg total) by mouth 3 (three) times daily as needed (headache). (Patient not taking: Reported on 01/10/2015) 30 tablet 0     Review of Systems  Constitutional: Negative for fever and chills.  Gastrointestinal: Negative for nausea, vomiting and abdominal pain.  Genitourinary: Negative for dysuria, urgency, frequency and hematuria.       + for vaginal discharge--neg for odor, itching, burning Neg for vaginal bleeding.  Neurological: Negative for headaches.   Physical Exam   Blood pressure 104/72, pulse 74, temperature 98.2 F (36.8 C), temperature source Oral, resp. rate 18, last menstrual period  05/12/2014.  Physical Exam  Constitutional: She is oriented to person, place, and time. She appears well-developed and well-nourished. No distress.  HENT:  Head: Normocephalic.  Neck: Normal range of motion.  Cardiovascular: Normal rate.   Respiratory: Effort normal.  GI: Soft. She exhibits no distension and no mass. There is no tenderness. There is no rebound and no guarding.  Genitourinary: There is no rash, tenderness or lesion on the right labia. There is no rash, tenderness or lesion on the left labia. Uterus is enlarged. Uterus is not tender. Cervix exhibits no motion tenderness, no discharge and no friability. No erythema, tenderness or bleeding in the vagina. No foreign body around the vagina. No signs of injury around the vagina. Vaginal discharge (scant white discharge without odor.  Neg for pooling of fluid) found.  Cervix is closed, posterior.  Neurological: She is alert and oriented to person, place, and time.  Skin: Skin is warm and dry.  Psychiatric: She has a normal mood and affect. Her behavior is normal.   Results for orders placed or performed during the hospital encounter of 01/10/15 (from the past 24 hour(s))  Urinalysis, Routine w reflex microscopic     Status: None   Collection Time: 01/10/15  4:00 PM  Result Value Ref Range   Color, Urine YELLOW YELLOW   APPearance CLEAR CLEAR   Specific Gravity, Urine 1.010 1.005 - 1.030   pH 7.5 5.0 - 8.0  Glucose, UA NEGATIVE NEGATIVE mg/dL   Hgb urine dipstick NEGATIVE NEGATIVE   Bilirubin Urine NEGATIVE NEGATIVE   Ketones, ur NEGATIVE NEGATIVE mg/dL   Protein, ur NEGATIVE NEGATIVE mg/dL   Urobilinogen, UA 0.2 0.0 - 1.0 mg/dL   Nitrite NEGATIVE NEGATIVE   Leukocytes, UA NEGATIVE NEGATIVE  Wet prep, genital     Status: Abnormal   Collection Time: 01/10/15  4:55 PM  Result Value Ref Range   Yeast Wet Prep HPF POC NONE SEEN NONE SEEN   Trich, Wet Prep NONE SEEN NONE SEEN   Clue Cells Wet Prep HPF POC NONE SEEN NONE SEEN    WBC, Wet Prep HPF POC FEW (A) NONE SEEN   MAU Course  Procedures   NST Reactive--baseline FHR 145  Mod variability.  Uterine irritability  MDM Consulted with Dr. Stefano Gaul who is on for [facility], PE, Labs and NST results reported.  Patient may be discharged to home.    Assessment and Plan  A:  Vaginal discharge --[redacted]w[redacted]d gestation       Neg wet prep       Cervix is closed       Reactive NST-uterine irritability  P:  Results given to patient     Keep scheduled appt with [facility] for 4/7     Encouraged patient to stay well hydrated     Call for vaginal bleeding, loss of fluid or decreased fetal movement  Sufyaan Palma,EVE M 01/10/2015, 4:55 PM

## 2015-01-22 ENCOUNTER — Inpatient Hospital Stay (HOSPITAL_COMMUNITY)
Admission: AD | Admit: 2015-01-22 | Discharge: 2015-01-22 | Disposition: A | Discharge status: Home / Self Care | Payer: 59 | Source: Ambulatory Visit | Attending: Obstetrics & Gynecology | Admitting: Obstetrics & Gynecology

## 2015-01-22 ENCOUNTER — Encounter (HOSPITAL_COMMUNITY): Payer: Self-pay | Admitting: *Deleted

## 2015-01-22 DIAGNOSIS — Z3A36 36 weeks gestation of pregnancy: Secondary | ICD-10-CM | POA: Insufficient documentation

## 2015-01-22 DIAGNOSIS — O9989 Other specified diseases and conditions complicating pregnancy, childbirth and the puerperium: Secondary | ICD-10-CM | POA: Insufficient documentation

## 2015-01-22 DIAGNOSIS — M549 Dorsalgia, unspecified: Secondary | ICD-10-CM | POA: Insufficient documentation

## 2015-01-22 DIAGNOSIS — O26893 Other specified pregnancy related conditions, third trimester: Secondary | ICD-10-CM | POA: Diagnosis not present

## 2015-01-22 DIAGNOSIS — O99891 Other specified diseases and conditions complicating pregnancy: Secondary | ICD-10-CM

## 2015-01-22 DIAGNOSIS — N859 Noninflammatory disorder of uterus, unspecified: Secondary | ICD-10-CM

## 2015-01-22 DIAGNOSIS — N858 Other specified noninflammatory disorders of uterus: Secondary | ICD-10-CM

## 2015-01-22 LAB — URINALYSIS, ROUTINE W REFLEX MICROSCOPIC
Bilirubin Urine: NEGATIVE
Glucose, UA: NEGATIVE mg/dL
Hgb urine dipstick: NEGATIVE
Ketones, ur: NEGATIVE mg/dL
Leukocytes, UA: NEGATIVE
Nitrite: NEGATIVE
Protein, ur: NEGATIVE mg/dL
Specific Gravity, Urine: 1.02 (ref 1.005–1.030)
Urobilinogen, UA: 0.2 mg/dL (ref 0.0–1.0)
pH: 7 (ref 5.0–8.0)

## 2015-01-22 NOTE — Discharge Instructions (Signed)
Back Injury Prevention °Back injuries can be extremely painful and difficult to heal. After having one back injury, you are much more likely to experience another later on. It is important to learn how to avoid injuring or re-injuring your back. The following tips can help you to prevent a back injury. °PHYSICAL FITNESS °· Exercise regularly and try to develop good tone in your abdominal muscles. Your abdominal muscles provide a lot of the support needed by your back. °· Do aerobic exercises (walking, jogging, biking, swimming) regularly. °· Do exercises that increase balance and strength (tai chi, yoga) regularly. This can decrease your risk of falling and injuring your back. °· Stretch before and after exercising. °· Maintain a healthy weight. The more you weigh, the more stress is placed on your back. For every pound of weight, 10 times that amount of pressure is placed on the back. °DIET °· Talk to your caregiver about how much calcium and vitamin D you need per day. These nutrients help to prevent weakening of the bones (osteoporosis). Osteoporosis can cause broken (fractured) bones that lead to back pain. °· Include good sources of calcium in your diet, such as dairy products, green, leafy vegetables, and products with calcium added (fortified). °· Include good sources of vitamin D in your diet, such as milk and foods that are fortified with vitamin D. °· Consider taking a nutritional supplement or a multivitamin if needed. °· Stop smoking if you smoke. °POSTURE °· Sit and stand up straight. Avoid leaning forward when you sit or hunching over when you stand. °· Choose chairs with good low back (lumbar) support. °· If you work at a desk, sit close to your work so you do not need to lean over. Keep your chin tucked in. Keep your neck drawn back and elbows bent at a right angle. Your arms should look like the letter "L." °· Sit high and close to the steering wheel when you drive. Add a lumbar support to your car  seat if needed. °· Avoid sitting or standing in one position for too long. Take breaks to get up, stretch, and walk around at least once every hour. Take breaks if you are driving for long periods of time. °· Sleep on your side with your knees slightly bent, or sleep on your back with a pillow under your knees. Do not sleep on your stomach. °LIFTING, TWISTING, AND REACHING °· Avoid heavy lifting, especially repetitive lifting. If you must do heavy lifting: °· Stretch before lifting. °· Work slowly. °· Rest between lifts. °· Use carts and dollies to move objects when possible. °· Make several small trips instead of carrying 1 heavy load. °· Ask for help when you need it. °· Ask for help when moving big, awkward objects. °· Follow these steps when lifting: °· Stand with your feet shoulder-width apart. °· Get as close to the object as you can. Do not try to pick up heavy objects that are far from your body. °· Use handles or lifting straps if they are available. °· Bend at your knees. Squat down, but keep your heels off the floor. °· Keep your shoulders pulled back, your chin tucked in, and your back straight. °· Lift the object slowly, tightening the muscles in your legs, abdomen, and buttocks. Keep the object as close to the center of your body as possible. °· When you put a load down, use these same guidelines in reverse. °· Do not: °· Lift the object above your waist. °·   Twist at the waist while lifting or carrying a load. Move your feet if you need to turn, not your waist.  Bend over without bending at your knees.  Avoid reaching over your head, across a table, or for an object on a high surface. OTHER TIPS  Avoid wet floors and keep sidewalks clear of ice to prevent falls.  Do not sleep on a mattress that is too soft or too hard.  Keep items that are used frequently within easy reach.  Put heavier objects on shelves at waist level and lighter objects on lower or higher shelves.  Find ways to  decrease your stress, such as exercise, massage, or relaxation techniques. Stress can build up in your muscles. Tense muscles are more vulnerable to injury.  Seek treatment for depression or anxiety if needed. These conditions can increase your risk of developing back pain. SEEK MEDICAL CARE IF:  You injure your back.  You have questions about diet, exercise, or other ways to prevent back injuries. MAKE SURE YOU:  Understand these instructions.  Will watch your condition.  Will get help right away if you are not doing well or get worse. Document Released: 11/09/2004 Document Revised: 12/25/2011 Document Reviewed: 11/13/2011 [facility] Patient Information 2015 [place], [place]. This information is not intended to replace advice given to you by your health care provider. Make sure you discuss any questions you have with your health care provider.  Vaginal Delivery During delivery, your health care provider will help you give birth to your baby. During a vaginal delivery, you will work to push the baby out of your vagina. However, before you can push your baby out, a few things need to happen. The opening of your uterus (cervix) has to soften, thin out, and open up (dilate) all the way to 10 cm. Also, your baby has to move down from the uterus into your vagina.  SIGNS OF LABOR  Your health care provider will first need to make sure you are in labor. Signs of labor include:   Passing what is called the mucous plug before labor begins. This is a small amount of blood-stained mucus.  Having regular, painful uterine contractions.   The time between contractions gets shorter.   The discomfort and pain gradually get more intense.  Contraction pains get worse when walking and do not go away when resting.   Your cervix becomes thinner (effacement) and dilates. BEFORE THE DELIVERY Once you are in labor and admitted into the hospital or care center, your health care provider may do the  following:   Perform a complete physical exam.  Review any complications related to pregnancy or labor.  Check your blood pressure, pulse, temperature, and heart rate (vital signs).   Determine if, and when, the rupture of amniotic membranes occurred.  Do a vaginal exam (using a sterile glove and lubricant) to determine:   The position (presentation) of the baby. Is the baby's head presenting first (vertex) in the birth canal (vagina), or are the feet or buttocks first (breech)?   The level (station) of the baby's head within the birth canal.   The effacement and dilatation of the cervix.   An electronic fetal monitor is usually placed on your abdomen when you first arrive. This is used to monitor your contractions and the baby's heart rate.  When the monitor is on your abdomen (external fetal monitor), it can only pick up the frequency and length of your contractions. It cannot tell the strength of your  contractions.  If it becomes necessary for your health care provider to know exactly how strong your contractions are or to see exactly what the baby's heart rate is doing, an internal monitor may be inserted into your vagina and uterus. Your health care provider will discuss the benefits and risks of using an internal monitor and obtain your permission before inserting the device.  Continuous fetal monitoring may be needed if you have an epidural, are receiving certain medicines (such as oxytocin), or have pregnancy or labor complications.  An IV access tube may be placed into a vein in your arm to deliver fluids and medicines if necessary. THREE STAGES OF LABOR AND DELIVERY Normal labor and delivery is divided into three stages. First Stage This stage starts when you begin to contract regularly and your cervix begins to efface and dilate. It ends when your cervix is completely open (fully dilated). The first stage is the longest stage of labor and can last from 3 hours to 15  hours.  Several methods are available to help with labor pain. You and your health care provider will decide which option is best for you. Options include:   Opioid medicines. These are strong pain medicines that you can get through your IV tube or as a shot into your muscle. These medicines lessen pain but do not make it go away completely.  Epidural. A medicine is given through a thin tube that is inserted in your back. The medicine numbs the lower part of your body and prevents any pain in that area.  Paracervical pain medicine. This is an injection of an anesthetic on each side of your cervix.   You may request natural childbirth, which does not involve the use of pain medicines or an epidural during labor and delivery. Instead, you will use other things, such as breathing exercises, to help cope with the pain. Second Stage The second stage of labor begins when your cervix is fully dilated at 10 cm. It continues until you push your baby down through the birth canal and the baby is born. This stage can take only minutes or several hours.  The location of your baby's head as it moves through the birth canal is reported as a number called a station. If the baby's head has not started its descent, the station is described as being at minus 3 (-3). When your baby's head is at the zero station, it is at the middle of the birth canal and is engaged in the pelvis. The station of your baby helps indicate the progress of the second stage of labor.  When your baby is born, your health care provider may hold the baby with his or her head lowered to prevent amniotic fluid, mucus, and blood from getting into the baby's lungs. The baby's mouth and nose may be suctioned with a small bulb syringe to remove any additional fluid.  Your health care provider may then place the baby on your stomach. It is important to keep the baby from getting cold. To do this, the health care provider will dry the baby off, place  the baby directly on your skin (with no blankets between you and the baby), and cover the baby with warm, dry blankets.   The umbilical cord is cut. Third Stage During the third stage of labor, your health care provider will deliver the placenta (afterbirth) and make sure your bleeding is under control. The delivery of the placenta usually takes about 5 minutes but can  take up to 30 minutes. After the placenta is delivered, a medicine may be given either by IV or injection to help contract the uterus and control bleeding. If you are planning to breastfeed, you can try to do so now. After you deliver the placenta, your uterus should contract and get very firm. If your uterus does not remain firm, your health care provider will massage it. This is important because the contraction of the uterus helps cut off bleeding at the site where the placenta was attached to your uterus. If your uterus does not contract properly and stay firm, you may continue to bleed heavily. If there is a lot of bleeding, medicines may be given to contract the uterus and stop the bleeding.  Document Released: 07/11/2008 Document Revised: 02/16/2014 Document Reviewed: 03/23/2013 [facility] Patient Information 2015 [place], [place]. This information is not intended to replace advice given to you by your health care provider. Make sure you discuss any questions you have with your health care provider.

## 2015-01-22 NOTE — MAU Provider Note (Signed)
History     CSN: [id number]  Arrival [id number] and time: 01/22/15 [id number]   None     Chief Complaint  Patient presents with  . Back Pain   Back Pain This is a new problem. The current episode started yesterday. The problem occurs intermittently. The problem is unchanged. The pain is present in the lumbar spine. The quality of the pain is described as aching. The pain does not radiate. The pain is moderate. The pain is worse during the day (worse in morning "at work"). Stiffness is present in the morning. Pertinent negatives include no abdominal pain, chest pain, dysuria, fever, headaches, leg pain, numbness, paresis, paresthesias, tingling or weakness. Risk factors include pregnancy. She has tried nothing for the symptoms.   This is a 24 y.o. female at 6168w3d who presents with complaints of constant back pain "in the morning at work" yesterday and today. Was seen if office yesterday but did not tell Dr that her back hurt. Was checked but does not know what her cervix was. Denies leaking or bleeding. + fetal movement.  RN note:  Expand All Collapse All   C/o constant lower back pain since yesterday morning; denies any UTI symptoms;        OB History    Gravida Para Term Preterm AB TAB SAB Ectopic Multiple Living   1               Past Medical History  Diagnosis Date  . Hepatitis B   . Headache   . Placenta previa 01/10/2015    Past Surgical History  Procedure Laterality Date  . No past surgeries      Family History  Problem Relation Age of Onset  . Hypertension Mother     History  Substance Use Topics  . Smoking status: Never Smoker   . Smokeless tobacco: Never Used  . Alcohol Use: No    Allergies:  Allergies  Allergen Reactions  . Tylenol [Acetaminophen] Other (See Comments)    headaches    Prescriptions prior to admission  Medication Sig Dispense Refill Last Dose  . prenatal vitamin w/FE, FA (NATACHEW) 29-1 MG CHEW chewable tablet Chew 1 tablet by mouth daily  at 12 noon. 30 tablet 12 01/21/2015 at Unknown time  . cyclobenzaprine (FLEXERIL) 10 MG tablet Take 1 tablet (10 mg total) by mouth 3 (three) times daily as needed (headache). (Patient not taking: Reported on 01/10/2015) 30 tablet 0     Review of Systems  Constitutional: Negative for fever, chills and malaise/fatigue.  Eyes: Negative for blurred vision.  Cardiovascular: Negative for chest pain.  Gastrointestinal: Negative for nausea, vomiting and abdominal pain.  Genitourinary: Negative for dysuria and flank pain.  Musculoskeletal: Positive for back pain. Negative for myalgias.  Neurological: Negative for tingling, sensory change, focal weakness, weakness, numbness, headaches and paresthesias.   Physical Exam   Blood pressure 98/69, pulse 90, temperature 97.8 F (36.6 C), temperature source Oral, resp. rate 18, last menstrual period 05/12/2014.  Physical Exam  Constitutional: She is oriented to person, place, and time. She appears well-developed and well-nourished. No distress.  HENT:  Head: Normocephalic.  Cardiovascular: Normal rate.   Respiratory: Effort normal.  GI: Soft. She exhibits no distension and no mass. There is no tenderness. There is no rebound and no guarding.  Genitourinary: Vagina normal. No vaginal discharge found.  Fetal heart rate reassuring Uterine irritability with contractions every 3-5 min Dilation: Closed Effacement (%): 50 Station: -3 Exam by:: Artelia LarocheM Williams CNM  Musculoskeletal: She exhibits tenderness (lumbar spine, bilaterally).  Neurological: She is alert and oriented to person, place, and time.  Skin: Skin is warm and dry.  Psychiatric: She has a normal mood and affect.   Recheck of cervix an hour later Dilation: Closed Effacement (%): 60 Station: -3 Exam by:: Artelia Laroche CNM   PO hydration continued.  By 1610, her contractions had almost stopped and she reports back pain is gone. Will give her a work note for today.  MAU Course   Procedures  MDM Consulted Dr Charlotta Newton. PO hydration used successfully for contractions. Did have a little more effacement but did not actively change cervix.   Assessment and Plan  A:   SIUP at [redacted]w[redacted]d        Preterm uterine irritability       Back pain in pregnancy.  P:  DIscharge home       Push PO fluids       Labor precautions       Call Dr Charlotta Newton if back pain resumes or you have other problems       Followup as scheduled in office for Ultrasound.  Wynelle Bourgeois 01/22/2015, 9:20 AM

## 2015-01-22 NOTE — MAU Note (Signed)
C/o constant lower back pain since yesterday morning; denies any UTI symptoms;

## 2015-01-27 ENCOUNTER — Telehealth (HOSPITAL_COMMUNITY): Payer: Self-pay | Admitting: *Deleted

## 2015-01-27 ENCOUNTER — Encounter (HOSPITAL_COMMUNITY): Payer: Self-pay | Admitting: *Deleted

## 2015-01-27 NOTE — Telephone Encounter (Signed)
Preadmission screen  

## 2015-02-02 ENCOUNTER — Inpatient Hospital Stay (HOSPITAL_COMMUNITY)
Admission: RE | Admit: 2015-02-02 | Discharge: 2015-02-06 | DRG: 765 | Disposition: A | Discharge status: Home / Self Care | Payer: 59 | Source: Ambulatory Visit | Attending: Obstetrics & Gynecology | Admitting: Obstetrics & Gynecology

## 2015-02-02 ENCOUNTER — Encounter (HOSPITAL_COMMUNITY): Payer: Self-pay

## 2015-02-02 DIAGNOSIS — Z3A38 38 weeks gestation of pregnancy: Secondary | ICD-10-CM | POA: Diagnosis present

## 2015-02-02 DIAGNOSIS — O99824 Streptococcus B carrier state complicating childbirth: Secondary | ICD-10-CM | POA: Diagnosis present

## 2015-02-02 DIAGNOSIS — O9912 Other diseases of the blood and blood-forming organs and certain disorders involving the immune mechanism complicating childbirth: Secondary | ICD-10-CM | POA: Diagnosis present

## 2015-02-02 DIAGNOSIS — D6959 Other secondary thrombocytopenia: Secondary | ICD-10-CM | POA: Diagnosis present

## 2015-02-02 DIAGNOSIS — IMO0002 Reserved for concepts with insufficient information to code with codable children: Secondary | ICD-10-CM | POA: Diagnosis present

## 2015-02-02 DIAGNOSIS — O99119 Other diseases of the blood and blood-forming organs and certain disorders involving the immune mechanism complicating pregnancy, unspecified trimester: Secondary | ICD-10-CM | POA: Diagnosis present

## 2015-02-02 DIAGNOSIS — B181 Chronic viral hepatitis B without delta-agent: Secondary | ICD-10-CM | POA: Diagnosis present

## 2015-02-02 DIAGNOSIS — O99344 Other mental disorders complicating childbirth: Secondary | ICD-10-CM | POA: Diagnosis present

## 2015-02-02 DIAGNOSIS — O365930 Maternal care for other known or suspected poor fetal growth, third trimester, not applicable or unspecified: Secondary | ICD-10-CM | POA: Diagnosis present

## 2015-02-02 DIAGNOSIS — Z98891 History of uterine scar from previous surgery: Secondary | ICD-10-CM

## 2015-02-02 DIAGNOSIS — O9842 Viral hepatitis complicating childbirth: Secondary | ICD-10-CM | POA: Diagnosis present

## 2015-02-02 DIAGNOSIS — O4440 Low lying placenta NOS or without hemorrhage, unspecified trimester: Secondary | ICD-10-CM | POA: Diagnosis present

## 2015-02-02 DIAGNOSIS — O4403 Placenta previa specified as without hemorrhage, third trimester: Secondary | ICD-10-CM | POA: Diagnosis present

## 2015-02-02 DIAGNOSIS — D696 Thrombocytopenia, unspecified: Secondary | ICD-10-CM | POA: Diagnosis present

## 2015-02-02 DIAGNOSIS — F329 Major depressive disorder, single episode, unspecified: Secondary | ICD-10-CM | POA: Diagnosis present

## 2015-02-02 LAB — CBC
HCT: 33.9 % — ABNORMAL LOW (ref 36.0–46.0)
Hemoglobin: 12 g/dL (ref 12.0–15.0)
MCH: 30.2 pg (ref 26.0–34.0)
MCHC: 35.4 g/dL (ref 30.0–36.0)
MCV: 85.2 fL (ref 78.0–100.0)
Platelets: 100 10*3/uL — ABNORMAL LOW (ref 150–400)
RBC: 3.98 MIL/uL (ref 3.87–5.11)
RDW: 13.2 % (ref 11.5–15.5)
WBC: 5.6 10*3/uL (ref 4.0–10.5)

## 2015-02-02 LAB — APTT: aPTT: 26 seconds (ref 24–37)

## 2015-02-02 LAB — PROTIME-INR
INR: 1.01 (ref 0.00–1.49)
Prothrombin Time: 13.4 seconds (ref 11.6–15.2)

## 2015-02-02 MED ORDER — CITRIC ACID-SODIUM CITRATE 334-500 MG/5ML PO SOLN
30.0000 mL | ORAL | Status: DC | PRN
  Administered 2015-02-03: 30 mL via ORAL
  Filled 2015-02-02: qty 15

## 2015-02-02 MED ORDER — OXYTOCIN BOLUS FROM INFUSION: 500.0000 mL | INTRAVENOUS | Status: DC

## 2015-02-02 MED ORDER — OXYCODONE-ACETAMINOPHEN 5-325 MG PO TABS: 2.0000 | ORAL_TABLET | ORAL | Status: DC | PRN

## 2015-02-02 MED ORDER — OXYCODONE-ACETAMINOPHEN 5-325 MG PO TABS: 1.0000 | ORAL_TABLET | ORAL | Status: DC | PRN

## 2015-02-02 MED ORDER — LACTATED RINGERS IV SOLN
500.0000 mL | INTRAVENOUS | Status: DC | PRN
  Administered 2015-02-03 (×2): 500 mL via INTRAVENOUS

## 2015-02-02 MED ORDER — ONDANSETRON HCL 4 MG/2ML IJ SOLN
4.0000 mg | Freq: Four times a day (QID) | INTRAMUSCULAR | Status: DC | PRN
  Administered 2015-02-03: 4 mg via INTRAVENOUS

## 2015-02-02 MED ORDER — OXYTOCIN 40 UNITS IN LACTATED RINGERS INFUSION - SIMPLE MED: 62.5000 mL/h | INTRAVENOUS | Status: DC

## 2015-02-02 MED ORDER — MISOPROSTOL 25 MCG QUARTER TABLET
25.0000 ug | ORAL_TABLET | ORAL | Status: DC
  Administered 2015-02-02 – 2015-02-03 (×3): 25 ug via VAGINAL
  Filled 2015-02-02 (×3): qty 0.25

## 2015-02-02 MED ORDER — ACETAMINOPHEN 325 MG PO TABS: 650.0000 mg | ORAL_TABLET | ORAL | Status: DC | PRN

## 2015-02-02 MED ORDER — TERBUTALINE SULFATE 1 MG/ML IJ SOLN: 0.2500 mg | Freq: Once | INTRAMUSCULAR | Status: AC | PRN

## 2015-02-02 MED ORDER — LACTATED RINGERS IV SOLN
INTRAVENOUS | Status: DC
  Administered 2015-02-02 – 2015-02-03 (×3): via INTRAVENOUS

## 2015-02-02 MED ORDER — LIDOCAINE HCL (PF) 1 % IJ SOLN
30.0000 mL | INTRAMUSCULAR | Status: DC | PRN
Start: 2015-02-02 — End: 2015-02-03

## 2015-02-02 NOTE — H&P (Signed)
HPI: 24 y/o G1P0 @ 4595w1d estimated gestational age (as dated by LMP c/w 20 week ultrasound) presents for IOL due to IUGR.   no Leaking of Fluid,   no Vaginal Bleeding,   irregular Uterine Contractions,  + Fetal Movement.  ROS: no HA, no epigastric pain, no visual changes.    Pregnancy complicated by: 1) IUGR- last US @37wk : vertex/posterior-fundal placenta (1.5 from os), EFW: 5#6oz, 2280g (<10%), normal dopplers -followed by NST/BPP twice weekly -per MFM recommendations plan for IOL @ 38wk  2) Low lying placenta- last US showed 1.5cm from os, discussed with MFM, ok to proceed with vaginal delivery.  Should pt be noted to have bleeding, plan to proceed with C-section.  T&S and crossed for 2u pRBCs  3) GBS positive- pt to receive PCN in labor 4) Depression/Social concerns- current on Zoloft 50mg  daily.  Concern that patient may not have resources to provide for her and the baby.  Please see prior social work notes, will contact them again for further consultation upon delivery. 5) Gestational thrombocytopenia- on 4/7, plt 92, []  repeat CBC and coags on arrival 6) Chronic Hep B- followed by LFTs and viral load q trimester, remained stable and within normal limits throughout pregnancy 7) Severe headaches- seen by neurology, resolved by late 2nd trimester   Prenatal Transfer Tool  Maternal Diabetes: No Genetic Screening: Normal Maternal Ultrasounds/Referrals: Normal Fetal Ultrasounds or other Referrals:  None Maternal Substance Abuse:  No Significant Maternal Medications:  Meds include: Zoloft Significant Maternal Lab Results: Lab values include: Group B Strep positive, HBsAG positive   PNL:  GBS positive, Rub Immune, Hep B positive, RPR NR, HIV neg, GC/C neg, glucola:99 @ 01/21/15: Hgb 12.1, plt 92 Blood type: O positive  OBHx: primip PMHx:  Chronic Hep B, Depression Meds:  PNV, Zoloft Allergy:   Allergies  Allergen Reactions  . Tylenol [Acetaminophen] Other (See Comments)   headaches   SurgHx: none SocHx:   no Tobacco, no  EtOH, no Illicit Drugs  O: LMP 05/12/2014  Examination performed in office on 4/14:  Gen. AAOx3, NAD CV.  RRR  No murmur.  Resp. CTAB Abd. Gravid,  no tenderness,  no rigidity,  no guarding Extr.  no edema B/L , no calf tenderness   FHT: 140bpm (performed in office) Toco: no contractions SVE:  Closed/25/-3  Labs: see orders  A/P:  24 y.o. G1P0 @ 695w1d EGA who presents for IOL due to IUGR along with low lying placenta, chronic Hep B and gestational thrombocytopenia.  -FWB:  Reassuring by recent BPP and NST, plan for continuous monitoring -IOL: will start with cytotec -Low lying placenta- should pt have abnormal bleeding from labor, will plan to proceed with C-section, pt is aware of this possibility -GBS: positive, plan to start PCN, with placement of Foley or dilation >523mc -Gestational thrombocytopenia: CBC ordered, coags to be collected -Pain management: IV medication only, until results of CBC returned -Will notify social worker of her admission   Myna HidalgoJennifer Tashonda Pinkus[place] [phone] (pager) [phone] (office)

## 2015-02-03 ENCOUNTER — Encounter (HOSPITAL_COMMUNITY): Payer: Self-pay

## 2015-02-03 ENCOUNTER — Encounter (HOSPITAL_COMMUNITY)
Admission: RE | Disposition: A | Discharge status: Home / Self Care | Payer: Self-pay | Source: Ambulatory Visit | Attending: Obstetrics & Gynecology

## 2015-02-03 ENCOUNTER — Inpatient Hospital Stay (HOSPITAL_COMMUNITY): Payer: 59 | Admitting: Anesthesiology

## 2015-02-03 LAB — CBC
HCT: 34.6 % — ABNORMAL LOW (ref 36.0–46.0)
Hemoglobin: 12 g/dL (ref 12.0–15.0)
MCH: 29.9 pg (ref 26.0–34.0)
MCHC: 34.7 g/dL (ref 30.0–36.0)
MCV: 86.3 fL (ref 78.0–100.0)
Platelets: 91 10*3/uL — ABNORMAL LOW (ref 150–400)
RBC: 4.01 MIL/uL (ref 3.87–5.11)
RDW: 13.2 % (ref 11.5–15.5)
WBC: 6.4 10*3/uL (ref 4.0–10.5)

## 2015-02-03 LAB — OB RESULTS CONSOLE GBS: GBS: POSITIVE

## 2015-02-03 LAB — HIV ANTIBODY (ROUTINE TESTING W REFLEX): HIV Screen 4th Generation wRfx: NONREACTIVE

## 2015-02-03 LAB — RPR: RPR Ser Ql: NONREACTIVE

## 2015-02-03 LAB — PREPARE RBC (CROSSMATCH)

## 2015-02-03 SURGERY — Surgical Case
Anesthesia: Regional

## 2015-02-03 MED ORDER — EPHEDRINE 5 MG/ML INJ: 10.0000 mg | INTRAVENOUS | Status: DC | PRN

## 2015-02-03 MED ORDER — LIDOCAINE-EPINEPHRINE (PF) 2 %-1:200000 IJ SOLN
INTRAMUSCULAR | Status: AC
  Filled 2015-02-03: qty 20

## 2015-02-03 MED ORDER — IBUPROFEN 600 MG PO TABS: 600.0000 mg | ORAL_TABLET | Freq: Four times a day (QID) | ORAL | Status: DC | PRN

## 2015-02-03 MED ORDER — OXYTOCIN 10 UNIT/ML IJ SOLN
40.0000 [IU] | INTRAVENOUS | Status: DC | PRN
  Administered 2015-02-03: 40 [IU] via INTRAVENOUS

## 2015-02-03 MED ORDER — SIMETHICONE 80 MG PO CHEW
80.0000 mg | CHEWABLE_TABLET | Freq: Three times a day (TID) | ORAL | Status: DC
  Administered 2015-02-04 – 2015-02-06 (×6): 80 mg via ORAL
  Filled 2015-02-03 (×6): qty 1

## 2015-02-03 MED ORDER — ZOLPIDEM TARTRATE 5 MG PO TABS: 5.0000 mg | ORAL_TABLET | Freq: Every evening | ORAL | Status: DC | PRN

## 2015-02-03 MED ORDER — MENTHOL 3 MG MT LOZG: 1.0000 | LOZENGE | OROMUCOSAL | Status: DC | PRN

## 2015-02-03 MED ORDER — CEFAZOLIN SODIUM-DEXTROSE 2-3 GM-% IV SOLR
2.0000 g | INTRAVENOUS | Status: AC
  Administered 2015-02-03: 2 g via INTRAVENOUS
  Filled 2015-02-03: qty 50

## 2015-02-03 MED ORDER — SODIUM BICARBONATE 8.4 % IV SOLN
INTRAVENOUS | Status: AC
  Filled 2015-02-03: qty 50

## 2015-02-03 MED ORDER — TERBUTALINE SULFATE 1 MG/ML IJ SOLN
0.2500 mg | Freq: Once | INTRAMUSCULAR | Status: AC | PRN
  Administered 2015-02-03: 0.25 mg via SUBCUTANEOUS
  Filled 2015-02-03: qty 1

## 2015-02-03 MED ORDER — NALBUPHINE HCL 10 MG/ML IJ SOLN: 5.0000 mg | INTRAMUSCULAR | Status: DC | PRN

## 2015-02-03 MED ORDER — DIPHENHYDRAMINE HCL 50 MG/ML IJ SOLN: 12.5000 mg | INTRAMUSCULAR | Status: DC | PRN

## 2015-02-03 MED ORDER — FENTANYL 2.5 MCG/ML BUPIVACAINE 1/10 % EPIDURAL INFUSION (WH - ANES)
14.0000 mL/h | INTRAMUSCULAR | Status: DC | PRN
  Administered 2015-02-03: 14 mL/h via EPIDURAL
  Filled 2015-02-03 (×2): qty 125

## 2015-02-03 MED ORDER — PROMETHAZINE HCL 25 MG/ML IJ SOLN: 6.2500 mg | INTRAMUSCULAR | Status: DC | PRN

## 2015-02-03 MED ORDER — PHENYLEPHRINE 40 MCG/ML (10ML) SYRINGE FOR IV PUSH (FOR BLOOD PRESSURE SUPPORT)
80.0000 ug | PREFILLED_SYRINGE | INTRAVENOUS | Status: DC | PRN
  Filled 2015-02-03: qty 20

## 2015-02-03 MED ORDER — ONDANSETRON HCL 4 MG/2ML IJ SOLN: 4.0000 mg | Freq: Three times a day (TID) | INTRAMUSCULAR | Status: DC | PRN

## 2015-02-03 MED ORDER — METHYLERGONOVINE MALEATE 0.2 MG/ML IJ SOLN
INTRAMUSCULAR | Status: DC | PRN
  Administered 2015-02-03: 0.2 mg via INTRAMUSCULAR

## 2015-02-03 MED ORDER — DIPHENHYDRAMINE HCL 25 MG PO CAPS: 25.0000 mg | ORAL_CAPSULE | Freq: Four times a day (QID) | ORAL | Status: DC | PRN

## 2015-02-03 MED ORDER — DIPHENHYDRAMINE HCL 25 MG PO CAPS: 25.0000 mg | ORAL_CAPSULE | ORAL | Status: DC | PRN

## 2015-02-03 MED ORDER — MORPHINE SULFATE (PF) 0.5 MG/ML IJ SOLN
INTRAMUSCULAR | Status: DC | PRN
  Administered 2015-02-03: 3 mg via EPIDURAL
  Administered 2015-02-03: 2 mg via EPIDURAL

## 2015-02-03 MED ORDER — ACETAMINOPHEN 325 MG PO TABS: 650.0000 mg | ORAL_TABLET | ORAL | Status: DC | PRN

## 2015-02-03 MED ORDER — PHENYLEPHRINE 40 MCG/ML (10ML) SYRINGE FOR IV PUSH (FOR BLOOD PRESSURE SUPPORT): 80.0000 ug | PREFILLED_SYRINGE | INTRAVENOUS | Status: DC | PRN

## 2015-02-03 MED ORDER — NALOXONE HCL 1 MG/ML IJ SOLN: 1.0000 ug/kg/h | INTRAMUSCULAR | Status: DC | PRN

## 2015-02-03 MED ORDER — NALBUPHINE HCL 10 MG/ML IJ SOLN
5.0000 mg | INTRAMUSCULAR | Status: DC | PRN
  Administered 2015-02-04: 5 mg via INTRAVENOUS
  Filled 2015-02-03: qty 1

## 2015-02-03 MED ORDER — ONDANSETRON HCL 4 MG/2ML IJ SOLN
INTRAMUSCULAR | Status: AC
  Filled 2015-02-03: qty 2

## 2015-02-03 MED ORDER — OXYTOCIN 40 UNITS IN LACTATED RINGERS INFUSION - SIMPLE MED: 62.5000 mL/h | INTRAVENOUS | Status: AC

## 2015-02-03 MED ORDER — WITCH HAZEL-GLYCERIN EX PADS: 1.0000 "application " | MEDICATED_PAD | CUTANEOUS | Status: DC | PRN

## 2015-02-03 MED ORDER — PRENATAL MULTIVITAMIN CH
1.0000 | ORAL_TABLET | Freq: Every day | ORAL | Status: DC
  Administered 2015-02-04 – 2015-02-06 (×3): 1 via ORAL
  Filled 2015-02-03 (×3): qty 1

## 2015-02-03 MED ORDER — SODIUM BICARBONATE 8.4 % IV SOLN
INTRAVENOUS | Status: DC | PRN
  Administered 2015-02-03: 5 mL via EPIDURAL

## 2015-02-03 MED ORDER — SIMETHICONE 80 MG PO CHEW
80.0000 mg | CHEWABLE_TABLET | ORAL | Status: DC
  Administered 2015-02-04 – 2015-02-06 (×2): 80 mg via ORAL
  Filled 2015-02-03 (×2): qty 1

## 2015-02-03 MED ORDER — NALBUPHINE HCL 10 MG/ML IJ SOLN
5.0000 mg | INTRAMUSCULAR | Status: DC | PRN
  Administered 2015-02-03: 5 mg via INTRAVENOUS
  Filled 2015-02-03: qty 1

## 2015-02-03 MED ORDER — OXYTOCIN 10 UNIT/ML IJ SOLN
INTRAMUSCULAR | Status: AC
  Filled 2015-02-03: qty 4

## 2015-02-03 MED ORDER — LACTATED RINGERS IV SOLN
INTRAVENOUS | Status: DC | PRN
  Administered 2015-02-03: 19:00:00 via INTRAVENOUS

## 2015-02-03 MED ORDER — SCOPOLAMINE 1 MG/3DAYS TD PT72
MEDICATED_PATCH | TRANSDERMAL | Status: AC
  Filled 2015-02-03: qty 1

## 2015-02-03 MED ORDER — KETOROLAC TROMETHAMINE 30 MG/ML IJ SOLN: 30.0000 mg | Freq: Four times a day (QID) | INTRAMUSCULAR | Status: AC | PRN

## 2015-02-03 MED ORDER — SERTRALINE HCL 25 MG PO TABS
25.0000 mg | ORAL_TABLET | Freq: Every day | ORAL | Status: DC
  Administered 2015-02-03 – 2015-02-06 (×4): 25 mg via ORAL
  Filled 2015-02-03 (×5): qty 1

## 2015-02-03 MED ORDER — NALOXONE HCL 0.4 MG/ML IJ SOLN: 0.4000 mg | INTRAMUSCULAR | Status: DC | PRN

## 2015-02-03 MED ORDER — OXYTOCIN 40 UNITS IN LACTATED RINGERS INFUSION - SIMPLE MED
1.0000 m[IU]/min | INTRAVENOUS | Status: DC
  Administered 2015-02-03: 2 m[IU]/min via INTRAVENOUS
  Filled 2015-02-03: qty 1000

## 2015-02-03 MED ORDER — HYDROMORPHONE HCL 1 MG/ML IJ SOLN
INTRAMUSCULAR | Status: AC
  Administered 2015-02-03: 0.5 mg via INTRAVENOUS
  Filled 2015-02-03: qty 1

## 2015-02-03 MED ORDER — SODIUM CHLORIDE 0.9 % IJ SOLN: 3.0000 mL | INTRAMUSCULAR | Status: DC | PRN

## 2015-02-03 MED ORDER — DIPHENHYDRAMINE HCL 50 MG/ML IJ SOLN
12.5000 mg | INTRAMUSCULAR | Status: DC | PRN
  Administered 2015-02-03: 12.5 mg via INTRAVENOUS
  Filled 2015-02-03: qty 1

## 2015-02-03 MED ORDER — OXYCODONE-ACETAMINOPHEN 5-325 MG PO TABS
1.0000 | ORAL_TABLET | ORAL | Status: DC | PRN
  Administered 2015-02-04 – 2015-02-05 (×2): 1 via ORAL
  Filled 2015-02-03 (×4): qty 1

## 2015-02-03 MED ORDER — LIDOCAINE HCL (PF) 1 % IJ SOLN
INTRAMUSCULAR | Status: DC | PRN
  Administered 2015-02-03: 8 mL

## 2015-02-03 MED ORDER — IBUPROFEN 600 MG PO TABS
600.0000 mg | ORAL_TABLET | Freq: Four times a day (QID) | ORAL | Status: DC
  Administered 2015-02-04 – 2015-02-06 (×6): 600 mg via ORAL
  Filled 2015-02-03 (×9): qty 1

## 2015-02-03 MED ORDER — MORPHINE SULFATE 0.5 MG/ML IJ SOLN
INTRAMUSCULAR | Status: AC
  Filled 2015-02-03: qty 10

## 2015-02-03 MED ORDER — MEPERIDINE HCL 25 MG/ML IJ SOLN
6.2500 mg | INTRAMUSCULAR | Status: DC | PRN
Start: 2015-02-03 — End: 2015-02-03

## 2015-02-03 MED ORDER — LACTATED RINGERS IV SOLN
500.0000 mL | Freq: Once | INTRAVENOUS | Status: AC
  Administered 2015-02-03: 500 mL via INTRAVENOUS

## 2015-02-03 MED ORDER — NALBUPHINE HCL 10 MG/ML IJ SOLN: 5.0000 mg | Freq: Once | INTRAMUSCULAR | Status: AC | PRN

## 2015-02-03 MED ORDER — FENTANYL 2.5 MCG/ML BUPIVACAINE 1/10 % EPIDURAL INFUSION (WH - ANES)
INTRAMUSCULAR | Status: DC | PRN
  Administered 2015-02-03: 14 mL/h via EPIDURAL

## 2015-02-03 MED ORDER — HYDROMORPHONE HCL 1 MG/ML IJ SOLN
0.2500 mg | INTRAMUSCULAR | Status: DC | PRN
  Administered 2015-02-03 (×4): 0.5 mg via INTRAVENOUS

## 2015-02-03 MED ORDER — SIMETHICONE 80 MG PO CHEW: 80.0000 mg | CHEWABLE_TABLET | ORAL | Status: DC | PRN

## 2015-02-03 MED ORDER — OXYCODONE-ACETAMINOPHEN 5-325 MG PO TABS
2.0000 | ORAL_TABLET | ORAL | Status: DC | PRN
  Administered 2015-02-05: 2 via ORAL

## 2015-02-03 MED ORDER — DIBUCAINE 1 % RE OINT: 1.0000 "application " | TOPICAL_OINTMENT | RECTAL | Status: DC | PRN

## 2015-02-03 MED ORDER — LANOLIN HYDROUS EX OINT: 1.0000 "application " | TOPICAL_OINTMENT | CUTANEOUS | Status: DC | PRN

## 2015-02-03 MED ORDER — SENNOSIDES-DOCUSATE SODIUM 8.6-50 MG PO TABS
2.0000 | ORAL_TABLET | ORAL | Status: DC
  Administered 2015-02-04 – 2015-02-06 (×2): 2 via ORAL
  Filled 2015-02-03 (×2): qty 2

## 2015-02-03 MED ORDER — LACTATED RINGERS IV SOLN
INTRAVENOUS | Status: DC
  Administered 2015-02-04: 02:00:00 via INTRAVENOUS

## 2015-02-03 MED ORDER — SCOPOLAMINE 1 MG/3DAYS TD PT72
1.0000 | MEDICATED_PATCH | Freq: Once | TRANSDERMAL | Status: DC
  Administered 2015-02-03: 1.5 mg via TRANSDERMAL

## 2015-02-03 SURGICAL SUPPLY — 33 items
BENZOIN TINCTURE PRP APPL 2/3 (GAUZE/BANDAGES/DRESSINGS) ×2 IMPLANT
CLAMP CORD UMBIL (MISCELLANEOUS) IMPLANT
CLOTH BEACON ORANGE TIMEOUT ST (SAFETY) ×2 IMPLANT
DRAPE SHEET LG 3/4 BI-LAMINATE (DRAPES) IMPLANT
DRSG OPSITE POSTOP 4X10 (GAUZE/BANDAGES/DRESSINGS) ×2 IMPLANT
DURAPREP 26ML APPLICATOR (WOUND CARE) ×2 IMPLANT
ELECT REM PT RETURN 9FT ADLT (ELECTROSURGICAL) ×2
ELECTRODE REM PT RTRN 9FT ADLT (ELECTROSURGICAL) ×1 IMPLANT
EXTRACTOR VACUUM KIWI (MISCELLANEOUS) IMPLANT
GLOVE BIOGEL PI IND STRL 6.5 (GLOVE) ×1 IMPLANT
GLOVE BIOGEL PI INDICATOR 6.5 (GLOVE) ×1
GLOVE ECLIPSE 6.5 STRL STRAW (GLOVE) ×2 IMPLANT
GOWN STRL REUS W/TWL LRG LVL3 (GOWN DISPOSABLE) ×4 IMPLANT
KIT ABG SYR 3ML LUER SLIP (SYRINGE) IMPLANT
LIQUID BAND (GAUZE/BANDAGES/DRESSINGS) IMPLANT
NEEDLE HYPO 25X5/8 SAFETYGLIDE (NEEDLE) IMPLANT
NS IRRIG 1000ML POUR BTL (IV SOLUTION) ×2 IMPLANT
PACK C SECTION WH (CUSTOM PROCEDURE TRAY) ×2 IMPLANT
PAD ABD 7.5X8 STRL (GAUZE/BANDAGES/DRESSINGS) ×2 IMPLANT
PAD OB MATERNITY 4.3X12.25 (PERSONAL CARE ITEMS) ×2 IMPLANT
RTRCTR C-SECT PINK 25CM LRG (MISCELLANEOUS) ×2 IMPLANT
STRIP CLOSURE SKIN 1/2X4 (GAUZE/BANDAGES/DRESSINGS) ×2 IMPLANT
SUT PLAIN 0 NONE (SUTURE) IMPLANT
SUT PLAIN 2 0 XLH (SUTURE) IMPLANT
SUT VIC AB 0 CT1 27 (SUTURE) ×2
SUT VIC AB 0 CT1 27XBRD ANBCTR (SUTURE) ×2 IMPLANT
SUT VIC AB 0 CTX 36 (SUTURE) ×3
SUT VIC AB 0 CTX36XBRD ANBCTRL (SUTURE) ×3 IMPLANT
SUT VIC AB 2-0 CT1 27 (SUTURE) ×1
SUT VIC AB 2-0 CT1 TAPERPNT 27 (SUTURE) ×1 IMPLANT
SUT VIC AB 4-0 KS 27 (SUTURE) ×2 IMPLANT
TOWEL OR 17X24 6PK STRL BLUE (TOWEL DISPOSABLE) ×2 IMPLANT
TRAY FOLEY CATH SILVER 14FR (SET/KITS/TRAYS/PACK) IMPLANT

## 2015-02-03 NOTE — Progress Notes (Addendum)
  Subjective: In to make the acquaintance of this very pleasant 24 yo G1P0 @ 38.1 wks who was admitted last evening for IOL for IUGR. Pt also with low lying placenta, Chronic Hep B and Gestational Thrombocytopenia. She reports +FM. Denies LOF or VB. Complained of pain 8/10 at midnight and received Nubain 5 mg IV at 00:22 AM with relief. Support person at bedside.   Objective: BP 116/71 mmHg  Pulse 57  Temp(Src) 97.6 F (36.4 C) (Oral)  Resp 18  Ht 5' 2.5" (1.588 m)  Wt 130 lb (58.968 kg)  BMI 23.38 kg/m2  LMP 05/12/2014      Today's Vitals   02/02/15 2200 02/02/15 2300 02/03/15 0000 02/03/15 0101  BP:    116/71  Pulse:    57  Temp:    97.6 F (36.4 C)  TempSrc:    Oral  Resp:    18  Height:      Weight:      PainSc: 0-No pain 0-No pain 8  0-No pain   Results for orders placed or performed during the hospital encounter of 02/02/15 (from the past 24 hour(s))  Type and screen     Status: None (Preliminary result)   Collection Time: 02/02/15  7:55 PM  Result Value Ref Range   ABO/RH(D) O POS    Antibody Screen PENDING    Sample Expiration 02/05/2015   APTT     Status: None   Collection Time: 02/02/15  7:55 PM  Result Value Ref Range   aPTT 26 24 - 37 seconds  Protime-INR     Status: None   Collection Time: 02/02/15  7:55 PM  Result Value Ref Range   Prothrombin Time 13.4 11.6 - 15.2 seconds   INR 1.01 0.00 - 1.49  CBC     Status: Abnormal   Collection Time: 02/02/15  7:55 PM  Result Value Ref Range   WBC 5.6 4.0 - 10.5 K/uL   RBC 3.98 3.87 - 5.11 MIL/uL   Hemoglobin 12.0 12.0 - 15.0 g/dL   HCT 16.133.9 (L) 09.636.0 - 04.546.0 %   MCV 85.2 78.0 - 100.0 fL   MCH 30.2 26.0 - 34.0 pg   MCHC 35.4 30.0 - 36.0 g/dL   RDW 40.913.2 81.111.5 - 91.415.5 %   Platelets 100 (L) 150 - 400 K/uL    FHT: BL 130 w/ moderate variability, +accels, no decels UC:   irregular SVE:   Dilation: Fingertip Effacement (%): 30 Station: -3 Exam by:: Thornell MuleK. Vinisha Faxon, CNM  Cytotec #1 placed by RN at 20:41 PM Cytotec  #2 placed by this provider in the posterior fornix at 01:05 AM  Assessment:  24 yo G1P0 @ 38.1 wks IOL due to IUGR (<10th%tile), normal dopplers Chronic Hep B Low lying placenta Gestational Thrombocytopenia (platelets 100 and normal coags on admission) Depression Cat 1 FHRT Resolved pain Unfavorable cvx GBS positive  Plan: Continue w/ current management plan Re-evaluate in 4 hrs for determination of Cytotec #3 Consult prn  Sherre ScarletWILLIAMS, Jasman Pfeifle CNM 02/03/2015, 1:09 AM

## 2015-02-03 NOTE — Op Note (Signed)
PreOp Diagnosis: Fetal intolerance of labor PostOp Diagnosis: Fetal intolerance to labor, IUGR, Chronic Hep B,  Procedure: Primary LTCS Surgeon: Dr. Myna HidalgoJennifer Toretto Nguyen Assistant: Dr. Dion BodyVarnado Anesthesia: epidural Complications: none EBL: 800cc UOP: 150cc Fluids: 150cc  INDICATIONS: 24yo G1P0@ 4570w1d who presented for IOL for IUGR.  Induction of labor was started with cytotec.  She progressed to a maximum dilation of 1cm and received an epidural for pain.  During her labor course the patient was noted to have late decels to 60bpm during vaginal examination and on examination placenta was palpated.  There was no active bleeding, but dark blood was noted on examination.  Due to fetal intolerance of labor with concern for placenta previa the patient was taken for a primary C-section  Findings: Female infant from vertex presentation.  Normal uterus, tubes and ovaries bilaterally.  PROCEDURE:  Informed consent was obtained from the patient with risks, benefits, complications, treatment options, and expected outcomes discussed with the patient.  The patient concurred with the proposed plan, giving informed consent with form signed.   The patient was taken to Operating Room, and identified with the procedure verified as C-Section Delivery with Time Out. With induction of anesthesia, the patient was prepped and draped in the usual sterile fashion. A Pfannenstiel incision was made and carried down through the subcutaneous tissue to the fascia. The fascia was incised in the midline and extended transversely. The superior aspect of the fascial incision was grasped with Kochers elevated and the underlying muscle dissected off. The inferior aspect of the facial incision was in similar fashion, grasped elevated and rectus muscles dissected off. The peritoneum was identified and entered. Peritoneal incision was extended longitudinally. The Alexis retractor was placed.  The utero-vesical peritoneal reflection was  identified and incised transversely with the [facility] scissors, the incision extended laterally, the bladder flap created digitally. A low transverse uterine incision was made and the infants head delivered atraumatically. After the umbilical cord was clamped and cut cord blood was obtained for evaluation.   The placenta was removed intact and appeared normal. The uterine outline, tubes and ovaries appeared normal. The uterine incision was closed with running locked sutures of 0 Vicryl and a second layer of the same stitch was used in an imbricating fashion.  Due to uterine atony, pt was given IM Methergine x 1.  Excellent hemostasis was obtained.  The pericolic gutters were then cleared of all clots and debris. The peritoneum was closed in a running fashion using 2-0 vicryl. The fascia was then reapproximated with running sutures of 0 Vicryl. The subcutaneous tissue was reapproximated with 2-0 plain gut suture.  The skin was closed with 4-0 vicryl in a subcuticular fashion.  Instrument, sponge, and needle counts were correct prior the abdominal closure and at the conclusion of the case. The patient was taken to recovery in stable condition.  Dr. Dion BodyVarnado was present for assistance since residents are not available and the concern for hemorrhage.  Robin HidalgoJennifer Noriel Guthrie, DO [phone] (pager) [phone] (office)

## 2015-02-03 NOTE — Progress Notes (Signed)
Order from Dr Arby BarretteHatchett to leave the Epidural in over night for drop in platelets.  Current level now 79.

## 2015-02-03 NOTE — Anesthesia Procedure Notes (Signed)
Epidural Patient location during procedure: OB Start time: 02/03/2015 9:33 AM End time: 02/03/2015 9:48 AM  Staffing Anesthesiologist: Sebastian AcheMANNY, Migdalia Olejniczak Performed by: anesthesiologist   Preanesthetic Checklist Completed: patient identified, site marked, surgical consent, pre-op evaluation, timeout performed, IV checked, risks and benefits discussed and monitors and equipment checked  Epidural Patient position: sitting Prep: site prepped and draped and DuraPrep Location: L2-L3  Needle:  Needle gauge: 17 G Needle insertion depth: 4.5 cm Catheter type: closed end flexible Catheter size: 19 Gauge Test dose: negative  Additional Notes Placed without problems

## 2015-02-03 NOTE — Progress Notes (Signed)
OB PN:  S: Pt resting comfortably, feelings some discomfort with contractions  O: BP 128/56 mmHg  Pulse 54  Temp(Src) 97.8 F (36.6 C) (Oral)  Resp 18  Ht 5' 2.5" (1.588 m)  Wt 58.968 kg (130 lb)  BMI 23.38 kg/m2  LMP 05/12/2014  FHT: 130bpm, moderate variablity, + accels, no decels Toco: q2-413min SVE: deferred  CBC    Component Value Date/Time   WBC 5.6 02/02/2015 1955   RBC 3.98 02/02/2015 1955   HGB 12.0 02/02/2015 1955   HCT 33.9* 02/02/2015 1955   PLT 100* 02/02/2015 1955   MCV 85.2 02/02/2015 1955   MCH 30.2 02/02/2015 1955   MCHC 35.4 02/02/2015 1955   RDW 13.2 02/02/2015 1955   LYMPHSABS 1.8 08/18/2014 0950   MONOABS 0.4 08/18/2014 0950   EOSABS 0.5 08/18/2014 0950   BASOSABS 0.0 08/18/2014 0950    A/P: 24 y.o. G1P0 @ 5363w1d for IOL due to IUGR 1. FWB: Cat. I 2. IOL: Plan for pitocin per protocol this am.  Ok for shower and diet prior to starting Pitocin. Pain: epidural upon request -Gestational thrombocytopenia: plt as above, normal coags -Chronic Hep B: last labs @ 36wks, within normal limits.  Will try to avoid internal monitors -GBS: positive, will start PCN once pt is transitioning to active labor -Depression: continue zoloft daily, will plan for social service consult following delivery.  Myna HidalgoJennifer Sabriya Yono, DO [phone] (pager) [phone] (office)

## 2015-02-03 NOTE — Anesthesia Postprocedure Evaluation (Signed)
Anesthesia Post Note  Patient: Robin HasteJeneba Skidgel  Procedure(s) Performed: Procedure(s) (LRB): CESAREAN SECTION (N/A)  Anesthesia type: Epidural  Patient location: PACU  Post pain: Pain level controlled  Post assessment: Post-op Vital signs reviewed  Last Vitals:  Filed Vitals:   02/03/15 2145  BP: 116/96  Pulse: 82  Temp:   Resp: 17    Post vital signs: Reviewed  Level of consciousness: awake  Complications: No apparent anesthesia complications. Epidural left in-situ because platelet count has fallen from 91K to 79K.

## 2015-02-03 NOTE — Progress Notes (Signed)
OB PN:  S: Pt resting comfortably with epidural  O: BP 106/80 mmHg  Pulse 68  Temp(Src) 97.8 F (36.6 C) (Axillary)  Resp 18  Ht 5' 2.5" (1.588 m)  Wt 58.968 kg (130 lb)  BMI 23.38 kg/m2  SpO2 99%  LMP 05/12/2014  FHT: 140bpm, moderate variablity, + accels, late decels x 2 to 60bpm x 1min each with spontaneous recovery- decelerations occurred during pelvic examination Toco: q2-704min SVE: 2/70/-2 with concern for placental edge palpated, ~ 20cc of dark blood noted on glove,   CBC    Component Value Date/Time   WBC 6.4 02/03/2015 0903   RBC 4.01 02/03/2015 0903   HGB 12.0 02/03/2015 0903   HCT 34.6* 02/03/2015 0903   PLT 91* 02/03/2015 0903   MCV 86.3 02/03/2015 0903   MCH 29.9 02/03/2015 0903   MCHC 34.7 02/03/2015 0903   RDW 13.2 02/03/2015 0903   LYMPHSABS 1.8 08/18/2014 0950   MONOABS 0.4 08/18/2014 0950   EOSABS 0.5 08/18/2014 0950   BASOSABS 0.0 08/18/2014 0950    A/P: 24 y.o. G1P0 @ 6878w1d for IOL due to IUGR 1. FWB: Cat. II 2. IOL: Due to fetal intolerance to labor and abnormal cervical examination with low lying placenta, plan for primary C-section.  Reviewed alternatives/indications, risk and benefits of cesarean section were discussed with the patient including but not limited to infection, bleeding, damage to bowel , bladder and baby with the need for further surgery. Pt voiced understanding and desires to proceed.   -Gestational thrombocytopenia: plt as above -Chronic Hep B: last labs @ 36wks, within normal limits.  Will try to avoid internal monitors -GBS: positive, will start PCN once pt is transitioning to active labor -Pain management: continue epidural -Depression: continue zoloft daily (patient declined), will plan for social service consult following delivery.  Myna HidalgoJennifer Vontae Court, DO [phone]) [phone] (office)

## 2015-02-03 NOTE — Transfer of Care (Signed)
Immediate Anesthesia Transfer of Care Note  Patient: Robin Nguyen  Procedure(s) Performed: Procedure(s): CESAREAN SECTION (N/A)  Patient Location: PACU  Anesthesia Type:Epidural  Level of Consciousness: awake, alert  and oriented  Airway & Oxygen Therapy: Patient Spontanous Breathing  Post-op Assessment: Report given to RN and Post -op Vital signs reviewed and stable  Post vital signs: Reviewed and stable  Last Vitals:  Filed Vitals:   02/03/15 1830  BP: 106/80  Pulse: 68  Temp:   Resp: 18    Complications: No apparent anesthesia complications

## 2015-02-03 NOTE — Progress Notes (Signed)
OB PN:  S: Pt resting comfortably with epidural  O: BP 122/66 mmHg  Pulse 94  Temp(Src) 98.3 F (36.8 C) (Oral)  Resp 20  Ht 5' 2.5" (1.588 m)  Wt 58.968 kg (130 lb)  BMI 23.38 kg/m2  SpO2 99%  LMP 05/12/2014  FHT: 140bpm, moderate variablity, + accels, no decels Toco: q2-424min SVE: deferred, @ 1300: 1/50/-2  CBC    Component Value Date/Time   WBC 6.4 02/03/2015 0903   RBC 4.01 02/03/2015 0903   HGB 12.0 02/03/2015 0903   HCT 34.6* 02/03/2015 0903   PLT 91* 02/03/2015 0903   MCV 86.3 02/03/2015 0903   MCH 29.9 02/03/2015 0903   MCHC 34.7 02/03/2015 0903   RDW 13.2 02/03/2015 0903   LYMPHSABS 1.8 08/18/2014 0950   MONOABS 0.4 08/18/2014 0950   EOSABS 0.5 08/18/2014 0950   BASOSABS 0.0 08/18/2014 0950    A/P: 24 y.o. G1P0 @ 1353w1d for IOL due to IUGR 1. FWB: Cat. I 2. IOL: Pit previously discontinued due to recurrent decels, recovered to Cat. I tracing.  Pitocin restarted, continue Pit per protocol. -Gestational thrombocytopenia: plt as above -Chronic Hep B: last labs @ 36wks, within normal limits.  Will try to avoid internal monitors -GBS: positive, will start PCN once pt is transitioning to active labor -Pain management: continue epidural -Depression: continue zoloft daily, will plan for social service consult following delivery.  Myna HidalgoJennifer Alonda Weaber, DO [phone] (pager) [phone] (office)

## 2015-02-03 NOTE — Anesthesia Preprocedure Evaluation (Addendum)
Anesthesia Evaluation  Patient identified by MRN, date of birth, ID band Patient awake and Patient confused    Reviewed: Allergy & Precautions, H&P , NPO status , Patient's Chart, lab work & pertinent test results  Airway Mallampati: II   Neck ROM: full    Dental   Pulmonary  breath sounds clear to auscultation  Pulmonary exam normal       Cardiovascular Exercise Tolerance: Good Rhythm:regular Rate:Normal     Neuro/Psych Depression    GI/Hepatic (+) Hepatitis -, B  Endo/Other    Renal/GU      Musculoskeletal   Abdominal Normal abdominal exam  (+)   Peds  Hematology Plts 91K   Anesthesia Other Findings   Reproductive/Obstetrics (+) Pregnancy GBS+                            Anesthesia Physical Anesthesia Plan  ASA: II  Anesthesia Plan: Epidural   Post-op Pain Management:    Induction:   Airway Management Planned:   Additional Equipment:   Intra-op Plan:   Post-operative Plan:   Informed Consent: I have reviewed the patients History and Physical, chart, labs and discussed the procedure including the risks, benefits and alternatives for the proposed anesthesia with the patient or authorized representative who has indicated his/her understanding and acceptance.     Plan Discussed with:   Anesthesia Plan Comments:         Anesthesia Quick Evaluation

## 2015-02-04 ENCOUNTER — Encounter (HOSPITAL_COMMUNITY): Payer: Self-pay | Admitting: Obstetrics & Gynecology

## 2015-02-04 DIAGNOSIS — Z98891 History of uterine scar from previous surgery: Secondary | ICD-10-CM

## 2015-02-04 LAB — CBC
HCT: 34.9 % — ABNORMAL LOW (ref 36.0–46.0)
HCT: 30.6 % — ABNORMAL LOW (ref 36.0–46.0)
HCT: 29.1 % — ABNORMAL LOW (ref 36.0–46.0)
Hemoglobin: 12 g/dL (ref 12.0–15.0)
Hemoglobin: 10.6 g/dL — ABNORMAL LOW (ref 12.0–15.0)
Hemoglobin: 10.6 g/dL — ABNORMAL LOW (ref 12.0–15.0)
MCH: 29.5 pg (ref 26.0–34.0)
MCH: 29.6 pg (ref 26.0–34.0)
MCH: 30.5 pg (ref 26.0–34.0)
MCHC: 34.4 g/dL (ref 30.0–36.0)
MCHC: 34.6 g/dL (ref 30.0–36.0)
MCHC: 36.4 g/dL — ABNORMAL HIGH (ref 30.0–36.0)
MCV: 85.7 fL (ref 78.0–100.0)
MCV: 85.5 fL (ref 78.0–100.0)
MCV: 83.9 fL (ref 78.0–100.0)
Platelets: 79 10*3/uL — ABNORMAL LOW (ref 150–400)
Platelets: 75 10*3/uL — ABNORMAL LOW (ref 150–400)
Platelets: 98 10*3/uL — ABNORMAL LOW (ref 150–400)
RBC: 4.07 MIL/uL (ref 3.87–5.11)
RBC: 3.58 MIL/uL — ABNORMAL LOW (ref 3.87–5.11)
RBC: 3.47 MIL/uL — ABNORMAL LOW (ref 3.87–5.11)
RDW: 12.9 % (ref 11.5–15.5)
RDW: 13 % (ref 11.5–15.5)
RDW: 13.4 % (ref 11.5–15.5)
WBC: 6 10*3/uL (ref 4.0–10.5)
WBC: 9.5 10*3/uL (ref 4.0–10.5)
WBC: 10 10*3/uL (ref 4.0–10.5)

## 2015-02-04 MED ORDER — FERROUS SULFATE 325 (65 FE) MG PO TABS
325.0000 mg | ORAL_TABLET | Freq: Two times a day (BID) | ORAL | Status: DC
  Administered 2015-02-04 – 2015-02-06 (×5): 325 mg via ORAL
  Filled 2015-02-04 (×5): qty 1

## 2015-02-04 MED ORDER — ACETAMINOPHEN 10 MG/ML IV SOLN
1000.0000 mg | Freq: Once | INTRAVENOUS | Status: AC
  Administered 2015-02-04: 1000 mg via INTRAVENOUS
  Filled 2015-02-04: qty 100

## 2015-02-04 NOTE — Addendum Note (Signed)
Addendum  created 02/04/15 0738 by Yolonda KidaAlison L Caillou Minus, CRNA   Modules edited: Notes Section   Notes Section:  File: [id number]

## 2015-02-04 NOTE — Anesthesia Postprocedure Evaluation (Signed)
  Anesthesia Post-op Note  Patient: Robin HasteJeneba Nguyen  Procedure(s) Performed: Procedure(s): CESAREAN SECTION (N/A)  Patient Location: [facility]  Anesthesia Type:Epidural  Level of Consciousness: awake, alert , oriented and patient cooperative  Airway and Oxygen Therapy: Patient Spontanous Breathing  Post-op Pain: none  Post-op Assessment: Post-op Vital signs reviewed, Patient's Cardiovascular Status Stable, Respiratory Function Stable, Patent Airway, No headache, No backache, No residual numbness and No residual motor weakness  Post-op Vital Signs: Reviewed and stable  Last Vitals:  Filed Vitals:   02/04/15 0609  BP: 108/66  Pulse: 71  Temp: 37.4 C  Resp: 16    Complications: No apparent anesthesia complications

## 2015-02-04 NOTE — Progress Notes (Signed)
CSW acknowledges consult for history of depression.   MOB is known to CSW as CSW met with MOB in MAU in February due to symptoms of depression.  MOB remembered CSW from previous visit.  CSW briefly spoke with MOB while at NICU beside as MOB was attempting to breastfeed the infant.  CSW inquired about meeting with MOB in her room prior to her discharge, MOB agreeable and expressed gratitude for brief CSW visit.   CSW to follow up.

## 2015-02-04 NOTE — Lactation Note (Signed)
This note was copied from the chart of Robin Wyatt HasteJeneba Kampa. Lactation Consultation Note  Assisted mom with breastfeeding in NICU.  Mom can easily hand express good amount of colostrum.  Mom has large nipple but baby opens wide and latches deeply.  Baby slips off occasionally and needs to be relatched.  Mom assisted with both cross cradle and football hold.  Patient Name: Robin Nguyen ZOXWR'UToday's Date: 02/04/2015 Reason for consult: Follow-up assessment;Infant < 6lbs;NICU baby   Maternal Data    Feeding Feeding Type: Breast Fed Length of feed: 15 min  LATCH Score/Interventions Latch: Repeated attempts needed to sustain latch, nipple held in mouth throughout feeding, stimulation needed to elicit sucking reflex. Intervention(s): Adjust position;Assist with latch;Breast massage;Breast compression  Audible Swallowing: A few with stimulation Intervention(s): Alternate breast massage;Hand expression;Skin to skin  Type of Nipple: Everted at rest and after stimulation  Comfort (Breast/Nipple): Soft / non-tender     Hold (Positioning): Assistance needed to correctly position infant at breast and maintain latch. Intervention(s): Breastfeeding basics reviewed;Support Pillows;Position options;Skin to skin  LATCH Score: 7  Lactation Tools Discussed/Used WIC Program: Yes Initiated by:: RN Date initiated:: 02/03/15   Consult Status Consult Status: Follow-up Date: 02/05/15 Follow-up type: In-patient    Huston FoleyMOULDEN, Adamaris King S 02/04/2015, 2:07 PM

## 2015-02-04 NOTE — Lactation Note (Signed)
This note was copied from the chart of [name]. Lactation Consultation Note  Initial visit made.  Baby is in the NICU and 16 hours old.  Mom has been set up with the [employer] pump.  She states she has pumped but has not obtained milk yet.  Reassured and discussed milk coming to volume.  Mom is active with WIC but she has her own DEBP at home.  Stressed importance of pumping every 3 hours for 15 minutes.  Encouraged to call with concerns/assist prn.  Patient Name: Girl Wyatt HasteJeneba Bieser ZHYQM'VToday's Date: 02/04/2015 Reason for consult: Initial assessment;NICU baby;Infant < 6lbs   Maternal Data    Feeding    LATCH Score/Interventions                      Lactation Tools Discussed/Used WIC Program: Yes Initiated by:: RN Date initiated:: 02/03/15   Consult Status Consult Status: Follow-up    Huston FoleyMOULDEN, Delois Tolbert S 02/04/2015, 11:29 AM

## 2015-02-04 NOTE — Progress Notes (Signed)
Postoperative Note Day # 1  S:  Patient resting comfortable in bed.  Pain controlled.  Tolerating general diet. + flatus, no BM.  Lochia minimal.  Ambulating without difficulty.  She denies n/v/f/c, SOB, or CP.  Pt plans on breastfeeding, breastpump at bedside.  Overnight dressing was saturated and changed, no active bleeding.    O: Temp:  [97.7 F (36.5 C)-99.4 F (37.4 C)] 99.4 F (37.4 C) (04/21 0609) Pulse Rate:  [53-115] 71 (04/21 0609) Resp:  [16-38] 16 (04/21 0609) BP: (92-130)/(48-97) 108/66 mmHg (04/21 0609) SpO2:  [96 %-100 %] 99 % (04/21 0609) Gen: A&Ox3, NAD CV: RRR, no MRG Resp: CTAB Abdomen: soft, NT, ND Uterus: firm, non-tender, below umbilicus Incision: c/d/i, bandage on Ext: No edema, no calf tenderness bilaterally, SCDs at bedside  Labs:  CBC Latest Ref Rng 02/04/2015 02/03/2015 02/03/2015  WBC 4.0 - 10.5 K/uL 9.5 6.0 6.4  Hemoglobin 12.0 - 15.0 g/dL 10.6(L) 12.0 12.0  Hematocrit 36.0 - 46.0 % 30.6(L) 34.9(L) 34.6(L)  Platelets 150 - 400 K/uL 75(L) 79(L) 91(L)     A/P: Pt is a 24 y.o. G1P1001 s/p primary C-section, POD#1  - Pain well controlled -GU: UOP is adequate, foley removed this am -GI: Tolerating general diet -Activity: encouraged sitting up to chair and ambulation as tolerated -Prophylaxis: early ambulation, SCDs while in bed -Gestational thrombocytopenia: expected decline from recent C-section, EBL 800cc.  No evidence of active bleeding.  Will d/w anesthesia removal of catheter. -Iron twice daily -Baby in NICU, mom breastpumping -Depression: continue zoloft daily, pt to be seen by social services  Continue with routine postoperative care  Myna HidalgoJennifer Franca Stakes, DO [phone]) [phone] (office)

## 2015-02-05 LAB — CBC
HCT: 28.4 % — ABNORMAL LOW (ref 36.0–46.0)
Hemoglobin: 9.8 g/dL — ABNORMAL LOW (ref 12.0–15.0)
MCH: 29.5 pg (ref 26.0–34.0)
MCHC: 34.5 g/dL (ref 30.0–36.0)
MCV: 85.5 fL (ref 78.0–100.0)
Platelets: 104 10*3/uL — ABNORMAL LOW (ref 150–400)
RBC: 3.32 MIL/uL — ABNORMAL LOW (ref 3.87–5.11)
RDW: 13.4 % (ref 11.5–15.5)
WBC: 8.7 10*3/uL (ref 4.0–10.5)

## 2015-02-05 NOTE — Lactation Note (Signed)
This note was copied from the chart of Robin Wyatt HasteJeneba Danh. Lactation Consultation Note  Patient Name: Robin Nguyen ZOXWR'UToday's Date: 02/05/2015 Reason for consult: Follow-up assessment NICU baby 46 hours of life. Baby more awake and cueing to nurse. Assisted to latch in cross-cradle. Baby latched several times, with and without NS, suckling in burst of 6-7 swallows. Baby sleepy at breast and had to be taken from breast each time before baby would latch. No amount of stimulation could rouse baby to latch. Mom has lots of colostrum, easily expressible, but baby not interested. Enc mom that STS and attempts good for mom's supply and baby as well. Enc mom to keep attempting, and mom more comfortable with positioning now.   Maternal Data    Feeding Feeding Type: Breast Fed Length of feed: 5 min  LATCH Score/Interventions Latch: Repeated attempts needed to sustain latch, nipple held in mouth throughout feeding, stimulation needed to elicit sucking reflex. Intervention(s): Skin to skin;Waking techniques Intervention(s): Adjust position;Assist with latch;Breast compression  Audible Swallowing: A few with stimulation Intervention(s): Skin to skin;Hand expression  Type of Nipple: Everted at rest and after stimulation  Comfort (Breast/Nipple): Soft / non-tender     Hold (Positioning): Assistance needed to correctly position infant at breast and maintain latch.  LATCH Score: 7  Lactation Tools Discussed/Used     Consult Status Consult Status: PRN    Geralynn OchsWILLIARD, Makailee Nudelman 02/05/2015, 5:59 PM

## 2015-02-05 NOTE — Plan of Care (Signed)
Problem: Phase II Progression Outcomes Goal: Pain controlled on oral analgesia Outcome: Completed/Met Date Met:  02/05/15 Good pain control on po Percocet and Motrin. Goal: Progress activity as tolerated unless otherwise ordered Outcome: Completed/Met Date Met:  02/05/15 Walks to NICU frequently and tolerates well. Goal: Afebrile, VS remain stable Outcome: Completed/Met Date Met:  02/05/15 VSS at this time.    Goal: Tolerating diet Outcome: Completed/Met Date Met:  02/05/15 Tolerating a Regular diet without any difficulty.     Problem: Discharge Progression Outcomes Goal: Barriers To Progression Addressed/Resolved Outcome: Completed/Met Date Met:  02/05/15 Voiding without difficulty.

## 2015-02-05 NOTE — Progress Notes (Signed)
Dr. Berneice HeinrichManny in to evaluate pt due to increased pain at epidural site. Pt states pain 8-10. IV tylenol given per MD. Stat CBC done with platelet result of 98.  Epidural taken out by Dr. Berneice HeinrichManny.

## 2015-02-05 NOTE — Lactation Note (Signed)
This note was copied from the chart of Robin Wyatt HasteJeneba Olivarez. Lactation Consultation Note  Patient Name: Robin Nguyen ZOXWR'UToday's Date: 02/05/2015 Reason for consult: Follow-up assessment NICU baby 39 hours of life. Mom reports that she is not getting anything when she pumps. Reviewed normal progression of milk coming in. Offered to assist with hand expression, but mom declined. Mom states that baby may be discharged tomorrow with her. Enc mom to continue pumping every 3 hours for 15 minutes and hand express after each time that she pumps. Enc mom to take whatever drops she gets from hand expressing to baby in NICU. Enc mom to call for assistance as needed. Mom has personal pump at home.  Maternal Data Has patient been taught Hand Expression?: Yes (Per mom.)  Feeding Feeding Type: Formula Length of feed: 5 min  LATCH Score/Interventions Latch: Grasps breast easily, tongue down, lips flanged, rhythmical sucking. Intervention(s): Assist with latch  Audible Swallowing: Spontaneous and intermittent Intervention(s): Skin to skin  Type of Nipple: Everted at rest and after stimulation  Comfort (Breast/Nipple): Soft / non-tender     Hold (Positioning): Assistance needed to correctly position infant at breast and maintain latch.  LATCH Score: 9  Lactation Tools Discussed/Used     Consult Status Consult Status: PRN    Geralynn OchsWILLIARD, Lia Vigilante 02/05/2015, 11:07 AM

## 2015-02-05 NOTE — Lactation Note (Signed)
This note was copied from the chart of Robin Nguyen. Lactation Consultation Note  Patient Name: Robin Wyatt HasteJeneba Oki WUJWJ'XToday's Date: 02/05/2015 Reason for consult: Follow-up assessment NICU baby 45 hours of life. Assisted mom to latch baby to breast in both football and cross-cradle position. Baby sleepy at breast. Baby had been cueing to nurse earlier, but sleepy at this time. Mom able readily hand express colostrum and place inside baby's mouth, but baby not responding. Baby would suckle LC's gloved finger, so fitted mom with a #20 NS just to see if this would elicit suckling, but there was no response even when mom filled shield with EBM. After several attempts, enc mom to offer STS and try again if baby cueing. Discussed with mom that STS very good for her milk supply.   Maternal Data    Feeding Feeding Type: Breast Fed Length of feed: 0 min  LATCH Score/Interventions Latch: Too sleepy or reluctant, no latch achieved, no sucking elicited. Intervention(s): Skin to skin;Waking techniques Intervention(s): Adjust position;Assist with latch;Breast compression  Audible Swallowing: None Intervention(s): Skin to skin;Hand expression Intervention(s): Hand expression  Type of Nipple: Everted at rest and after stimulation  Comfort (Breast/Nipple): Soft / non-tender     Hold (Positioning): Assistance needed to correctly position infant at breast and maintain latch. Intervention(s): Breastfeeding basics reviewed;Support Pillows;Skin to skin;Position options  LATCH Score: 5  Lactation Tools Discussed/Used     Consult Status Consult Status: PRN    Robin Nguyen, Robin Nguyen 02/05/2015, 4:39 PM

## 2015-02-05 NOTE — Lactation Note (Signed)
This note was copied from the chart of Robin Nguyen. Lactation Consultation Note  Patient Name: Robin Wyatt HasteJeneba Mulvaney GNFAO'ZToday's Date: 02/05/2015 Reason for consult: Follow-up assessment NICU baby 41 hours of life. Called to NICU to assist with latching baby at bedside. Mom has been nursing baby in football position, would like to try a different position. Assisted mom to latch to left breast in cross-cradle position. Enc mom to let baby open mouth widely before attempting to latch. Mom attempting to press baby's closed mouth onto mom's nipple. Baby able to latch briefly, suckling for short periods of time. Baby sleepy at breast, so discussed LC returning to assist at next feeding. Mom has brought EBM to NICU and she has lots of colostrum dripping while baby latching. Mom states both positions are comfortable for her.  Maternal Data Has patient been taught Hand Expression?: Yes (Per mom.)  Feeding Feeding Type: Breast Fed Length of feed: 2 min  LATCH Score/Interventions Latch: Too sleepy or reluctant, no latch achieved, no sucking elicited. Intervention(s): Skin to skin;Waking techniques Intervention(s): Adjust position;Assist with latch;Breast compression  Audible Swallowing: None Intervention(s): Skin to skin;Hand expression Intervention(s): Hand expression  Type of Nipple: Everted at rest and after stimulation  Comfort (Breast/Nipple): Soft / non-tender     Hold (Positioning): Assistance needed to correctly position infant at breast and maintain latch. Intervention(s): Breastfeeding basics reviewed;Support Pillows;Skin to skin;Position options  LATCH Score: 5  Lactation Tools Discussed/Used     Consult Status Consult Status: PRN    Geralynn OchsWILLIARD, Maddyx Wieck 02/05/2015, 1:17 PM

## 2015-02-05 NOTE — Progress Notes (Signed)
CSW continues to make attempts to meet with MOB.    CSW arrived in MOB's room and she had visitors in her room.  On second attempt on 4/22, MOB was sleeping.   CSW will notify oncoming CSW of need for assessment.  

## 2015-02-05 NOTE — Progress Notes (Signed)
Postoperative Note Day # 2  S:  Patient resting comfortable in bed.  Pain controlled.  Tolerating general diet. + flatus, no BM.  Lochia minimal.  Ambulating without difficulty.  She denies n/v/f/c, SOB, or CP.  Pt plans on breastfeeding, breastpump at bedside.  Since removal of epidural reports no further pain.  O: Temp:  [98.1 F (36.7 C)-98.8 F (37.1 C)] 98.1 F (36.7 C) (04/22 0540) Pulse Rate:  [56-72] 56 (04/22 0540) Resp:  [17-18] 18 (04/22 0540) BP: (96-114)/(49-72) 96/49 mmHg (04/22 0540) SpO2:  [100 %] 100 % (04/22 0540) Gen: A&Ox3, NAD CV: RRR, no MRG Resp: CTAB Abdomen: soft, NT, ND, +BS Uterus: firm, non-tender, below umbilicus Incision: c/d/i, bandage on Ext: No edema, no calf tenderness bilaterally Labs:  CBC Latest Ref Rng 02/05/2015 02/04/2015 02/04/2015  WBC 4.0 - 10.5 K/uL 8.7 10.0 9.5  Hemoglobin 12.0 - 15.0 g/dL 4.0(J9.8(L) 10.6(L) 10.6(L)  Hematocrit 36.0 - 46.0 % 28.4(L) 29.1(L) 30.6(L)  Platelets 150 - 400 K/uL 104(L) 98(L) 75(L)     A/P: Pt is a 24 y.o. G1P1001 s/p primary C-section, POD#2  - Pain well controlled -GU: voiding freely -GI: Tolerating general diet -Activity: encouraged sitting up to chair and ambulation as tolerated -Prophylaxis: early ambulation, SCDs while in bed -Gestational thrombocytopenia: platelets improved, labs stable. Continue iron twice daily -Baby in NICU, mom breastpumping -Depression: continue zoloft daily, pt to be seen by social services  Continue with routine postoperative care, plan for discharge home tomorrow (4/23)  Myna HidalgoJennifer Mercy Malena, DO [phone] (pager) [phone] (office)

## 2015-02-06 LAB — TYPE AND SCREEN
ABO/RH(D): O POS
Antibody Screen: POSITIVE
DAT, IgG: NEGATIVE
PT AG Type: NEGATIVE
Unit division: 0
Unit division: 0

## 2015-02-06 MED ORDER — IBUPROFEN 600 MG PO TABS: 600.0000 mg | ORAL_TABLET | Freq: Four times a day (QID) | ORAL | Status: DC | PRN

## 2015-02-06 MED ORDER — OXYCODONE-ACETAMINOPHEN 5-325 MG PO TABS: 1.0000 | ORAL_TABLET | Freq: Three times a day (TID) | ORAL | Status: DC | PRN

## 2015-02-06 MED ORDER — SERTRALINE HCL 25 MG PO TABS: 25.0000 mg | ORAL_TABLET | Freq: Every day | ORAL | Status: DC

## 2015-02-06 MED ORDER — FERROUS SULFATE 325 (65 FE) MG PO TABS: 325.0000 mg | ORAL_TABLET | Freq: Two times a day (BID) | ORAL | Status: DC

## 2015-02-06 NOTE — Progress Notes (Signed)
Discharge teaching complete. Pt understood all instructions and did not have any questions. Social worker did meet with patient before discharge. Pt ambulated out of the hospital and discharged home to family.

## 2015-02-06 NOTE — Lactation Note (Signed)
This note was copied from the chart of Robin Wyatt HasteJeneba Adderly. Lactation Consultation Note  Patient Name: Robin Nguyen ZOXWR'UToday's Date: 02/06/2015 Reason for consult: Follow-up assessment;NICU baby;Infant < 6lbs   Assisted with positioning and latch in the NICU.  Baby in cross cradle hold, adjusted Mom's hand to a U hold to facilitate a deeper latch.  Mom's breasts filling, and milk dripping from nipples.  Baby undressed to have baby positioned skin to skin.  Baby latched easily with a little assistance.  Swallowing heard.  Baby fed for 10 minutes before falling off the breast.  Mom encouraged to post breast feed pump both breasts.  Mom has a double pump at home from her insurance company.  Encouraged her to continue pumping along with feed baby at least every 3 hrs.  To follow with expressed breast milk by bottle of about 30 ml until baby able to more vigorous at the breast.  Lactation appointment made for Wednesday, the 27th for feeding assessment.  Pediatrician appointment will be Monday the 25th.  To call our office prn for any problems or questions.   Consult Status Consult Status: Follow-up Date: 02/12/15 Follow-up type: Out-patient    Judee ClaraSmith, Justyna Timoney E 02/06/2015, 1:12 PM

## 2015-02-06 NOTE — Discharge Instructions (Signed)
Cesarean Delivery, Care After Refer to this sheet in the next few weeks. These instructions provide you with information on caring for yourself after your procedure. Your health care provider may also give you specific instructions. Your treatment has been planned according to current medical practices, but problems sometimes occur. Call your health care provider if you have any problems or questions after you go home. HOME CARE INSTRUCTIONS  For pain management: You may take Motrin or Percocet for moderate pain.  The percocet may cause constipation, please be sure to continue with Colace (stool softener) twice daily.  Do not drink alcohol, especially if you are breastfeeding or taking medication to relieve pain.  Do not chew or smoke tobacco.  Continue to use good perineal care. Good perineal care includes:  Wiping your perineum from front to back.  Keeping your perineum clean.  Check your surgical cut (incision) daily for increased redness, drainage, swelling, or separation of skin.  Clean your incision gently with soap and water every day, and then pat it dry. If your health care provider says it is okay, leave the incision uncovered. Use a bandage (dressing) if the incision is draining fluid or appears irritated. If the adhesive strips across the incision do not fall off within 7 days, carefully peel them off.  Hug a pillow when coughing or sneezing until your incision is healed. This helps to relieve pain.  Do not use tampons or douche until your health care provider says it is okay.  Shower, wash your hair, and take tub baths as directed by your health care provider.  Wear a well-fitting bra that provides breast support.  Limit wearing support panties or control-top hose.  Drink enough fluids to keep your urine clear or pale yellow.  Eat high-fiber foods such as whole grain cereals and breads, brown rice, beans, and fresh fruits and vegetables every day. These foods may help  prevent or relieve constipation.  Resume activities such as climbing stairs, driving, lifting, exercising, or traveling as directed by your health care provider.  Talk to your health care provider about resuming sexual activities. This is dependent upon your risk of infection, your rate of healing, and your comfort and desire to resume sexual activity.  Try to have someone help you with your household activities and your newborn for at least a few days after you leave the hospital.  Rest as much as possible. Try to rest or take a nap when your newborn is sleeping.  Increase your activities gradually.  Keep all of your scheduled postpartum appointments. It is very important to keep your scheduled follow-up appointments. At these appointments, your health care provider will be checking to make sure that you are healing physically and emotionally. SEEK MEDICAL CARE IF:   You are passing large clots from your vagina. Save any clots to show your health care provider.  You have a foul smelling discharge from your vagina.  You have trouble urinating.  You are urinating frequently.  You have pain when you urinate.  You have a change in your bowel movements.  You have increasing redness, pain, or swelling near your incision.  You have pus draining from your incision.  Your incision is separating.  You have painful, hard, or reddened breasts.  You have a severe headache.  You have blurred vision or see spots.  You feel sad or depressed.  You have thoughts of hurting yourself or your newborn.  You have questions about your care, the care of your  newborn, or medications.  You are dizzy or light-headed.  You have a rash.  You have pain, redness, or swelling at the site of the removed intravenous access (IV) tube.  You have nausea or vomiting.  You stopped breastfeeding and have not had a menstrual period within 12 weeks of stopping.  You are not breastfeeding and have not  had a menstrual period within 12 weeks of delivery.  You have a fever. SEEK IMMEDIATE MEDICAL CARE IF:  You have persistent pain.  You have chest pain.  You have shortness of breath.  You faint.  You have leg pain.  You have stomach pain.  Your vaginal bleeding saturates 2 or more sanitary pads in 1 hour. MAKE SURE YOU:   Understand these instructions.  Will watch your condition.  Will get help right away if you are not doing well or get worse. Document Released: 06/24/2002 Document Revised: 02/16/2014 Document Reviewed: 05/29/2012 [facility] Patient Information 2015 [employer] [place] [place]. This information is not intended to replace advice given to you by your health care provider. Make sure you discuss any questions you have with your health care provider.

## 2015-02-06 NOTE — Progress Notes (Signed)
Postoperative Note Day # 3  S:  Patient resting comfortable in bed.  Pain well controlled.  Tolerating general diet. + flatus, no BM.  Lochia minimal.  Ambulating without difficulty.  She denies n/v/f/c, SOB, or CP.  Pt plans on breastfeeding, breastpump at bedside.  No acute complaints.  O: Temp:  [95.7 F (35.4 C)-98.6 F (37 C)] 97.7 F (36.5 C) (04/23 0600) Pulse Rate:  [55-69] 66 (04/23 0600) Resp:  [16-18] 16 (04/23 0600) BP: (110-111)/(64-74) 110/68 mmHg (04/23 0600) SpO2:  [100 %] 100 % (04/23 0600) Gen: A&Ox3, NAD CV: RRR, no MRG Resp: CTAB Abdomen: soft, NT, ND, +BS Uterus: firm, non-tender, below umbilicus Incision: bandage removed, steris with old blood, incision C/D/I Ext: No edema, no calf tenderness bilaterally Labs:  CBC Latest Ref Rng 02/05/2015 02/04/2015 02/04/2015  WBC 4.0 - 10.5 K/uL 8.7 10.0 9.5  Hemoglobin 12.0 - 15.0 g/dL 1.6(X9.8(L) 10.6(L) 10.6(L)  Hematocrit 36.0 - 46.0 % 28.4(L) 29.1(L) 30.6(L)  Platelets 150 - 400 K/uL 104(L) 98(L) 75(L)     A/P: Pt is a 24 y.o. G1P1001 s/p primary C-section, POD#3  - Pain well controlled -GU: voiding freely -GI: Tolerating general diet, encouraged stool softener -Activity: encouraged sitting up to chair and ambulation as tolerated -Prophylaxis: early ambulation -Gestational thrombocytopenia: platelets improved, labs stable. Continue iron twice daily -Baby in NICU, mom has pump at bedside  Possible discharge home today with mom -Depression: continue zoloft daily, pt to be seen by social services before discharge  DISPO: Plan to discharge patient once seen by social services with close outpatient follow up.  Myna HidalgoJennifer Hayly Litsey, DO [phone] (pager) [phone] (office)

## 2015-02-07 NOTE — Progress Notes (Signed)
CLINICAL SOCIAL WORK MATERNAL/CHILD NOTE  Patient Details  Name: Girl Robin Nguyen MRN: [id number] Date of Birth: 02/03/2015  Date: 02/07/2015  Clinical Social Worker Initiating Note: Norlene Duel, LCSWDate/ Time Initiated: 02/06/15/1130   Child's Name:  Robin Nguyen   Legal Guardian: Mother   Need for Interpreter: None   Date of Referral: 02/05/15   Reason for Referral:  (Hx of Depression)   Referral Source: NICU   Address: [address] 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 MOB known to CSW from previous hospitalization. Met with mother who was pleasant and receptive to social work. Informed that FOB is uninvolved and she has had no contact with him since she informed him of the pregnancy. Informed that they were not in a relationship at time of conception. Mother states that she expect no support from him, and she does not know how to contact him. Mother reports hx of depression. She denies SI, HI and there was no evidence of psychosis. Affect and behavior was appropriate throughout the assessment. She denies hx of SA. Informed that she is employed and plan to return to work. She is currently  living with her grandmother, but she is really not allowed to live in her home because it is a Estate manager/land agent with strict rules. MOB states that they are trying to wait for her to find another place. Meanwhile she has applied for a low income apartment, but this could take up to a year. Encouraged MOB to speak with her pastor regarding her housing situation as he may know of someone willing to rent a room or needing a roommate. She will also continue to explore natural resources. Provided her with list of homeless shelters. She reports having needed baby care items. Transportation is not an issue for her. Mother informed of social work  CSW Plan/Description: No Further Intervention Required/No Barriers to Discharge

## 2015-02-09 NOTE — Discharge Summary (Signed)
Obstetric Discharge Summary Reason for Admission: induction of labor Prenatal Procedures: NST and ultrasound Intrapartum Procedures: cesarean: low cervical, transverse and GBS prophylaxis Postpartum Procedures: none Complications-Operative and Postpartum: none  Date of Admission: 02/02/15 Date of Discharge: 02/06/15  Hospital Course: 24 y/o G1P0 @ 1372w1d estimated gestational age (as dated by LMP c/w 20 week ultrasound) presents for IOL due to IUGR. Pregnancy complicated by: 1) IUGR- last US @37wk : vertex/posterior-fundal placenta (1.5 from os), EFW: 5#6oz, 2280g (<10%), normal dopplers -followed by NST/BPP twice weekly -per MFM recommendations plan for IOL @ 38wk  2) Low lying placenta- last US showed 1.5cm from os, discussed with MFM, ok to proceed with vaginal delivery. Should pt be noted to have bleeding, plan to proceed with C-section. T&S and crossed for 2u pRBCs  3) GBS positive- pt to receive PCN in labor 4) Depression/Social concerns- current on Zoloft 50mg  daily. Concern that patient may not have resources to provide for her and the baby. Please see prior social work notes, will contact them again for further consultation upon delivery. 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 She progressed to a maximum dilation of 1cm and received an epidural for pain. During her labor course the patient was noted to have late decels to 60bpm during vaginal examination and on examination placenta was palpated. There was no active bleeding, but dark blood was noted on examination. Due to fetal intolerance of labor with concern for placenta previa the patient was taken for a primary C-section performed by Dr. Charlotta Newtonzan.   For information regarding the procedure, please see the operative report.  Her postoperative care was uncomplicated except for thrombocytopenia, which required delayed removal of the epidural catheter.  However, clinically she remained asymptomatic throughout her postpartum course.  She was discharge home in stable condition on postoperative Day #3 with plans for 2wk follow up.  HEMOGLOBIN  Date Value Ref Range Status  02/05/2015 9.8* 12.0 - 15.0 g/dL Final   HCT  Date Value Ref Range Status  02/05/2015 28.4* 36.0 - 46.0 % Final    Physical Exam:  Gen: A&Ox3, NAD CV: RRR, no MRG Resp: CTAB Abdomen: soft, NT, ND, +BS Uterus: firm, non-tender, below umbilicus Incision: bandage removed, steris with old blood, incision C/D/I Ext: No edema, no calf tenderness bilaterally   Discharge Diagnoses: Term Pregnancy-delivered and Failed induction  Discharge Information: Date: 02/09/2015 Activity: pelvic rest Diet: routine Medications: PNV, Ibuprofen, Colace, Iron and Percocet Condition: stable Instructions: refer to practice specific booklet Discharge to: home Follow-up Information    Follow up with Robin Nguyen, Robin Nguyen, Robin Nguyen, Robin Nguyen In 2 weeks.   Specialty:  Obstetrics and Gynecology   Contact information:   [address] [place] [address] [phone]       Newborn Data: Live born female  Birth Weight: 5 lb 12.4 oz (2620 g) APGAR: 8, 8  Home with mother.  Robin Nguyen, Robin Nguyen, Robin Nguyen 02/09/2015, 7:36 AM

## 2015-02-10 ENCOUNTER — Ambulatory Visit (HOSPITAL_COMMUNITY)
Admission: RE | Admit: 2015-02-10 | Discharge: 2015-02-10 | Disposition: A | Discharge status: Home / Self Care | Payer: 59 | Source: Ambulatory Visit | Attending: Obstetrics & Gynecology | Admitting: Obstetrics & Gynecology

## 2015-02-10 ENCOUNTER — Ambulatory Visit (HOSPITAL_COMMUNITY): Payer: 59

## 2015-02-10 NOTE — Lactation Note (Signed)
Lactation Consult  Mother's reason for visit:  NICU follow up Visit Type:  Feeding assessment Appointment Notes:  38 Robin Nguyen, <6 lbs Consult:  Initial Lactation Consultant:  Audry RilesWeeks, Kortnie Stovall D  ________________________________________________________________________    ________________________________________________________________________  Mother's Name: Robin HasteJeneba Nguyen Type of delivery:   Breastfeeding Experience:  P1  ________________________________________________________________________  Breastfeeding History (Post Discharge)  Frequency of breastfeeding:  Has been BF about 5 times/day Duration of feeding:  10-30 min  Supplementation  Formula:  Volume 2-3 oz Frequency:  2-3 times/day        Brand: Similac  Breastmilk:  Volume 2-3 oz Frequency:  Most feedings  Method:  Bottle,   Pumping  Type of pump:  Medela pump in style Frequency:  8 times/day Volume:  3-4 oz  Infant Intake and Output Assessment  Voids: 4-5 in 24 hrs.  Color:  Clear yellow- had void while here for appointment Stools: 4-5 in 24 hrs.  Color:  Brown  ________________________________________________________________________  Maternal Breast Assessment  Breast:  Full Nipple:  Erect Pain level:  0 Pain interventions:  NA  _______________________________________________________________________ Feeding Assessment/Evaluation  Initial feeding assessment:  Infant's oral assessment:  WNL  Positioning:  Cradle Left breast  LATCH documentation:  Latch:  2 = Grasps breast easily, tongue down, lips flanged, rhythmical sucking.  Audible swallowing:  2 = Spontaneous and intermittent  Type of nipple:  2 = Everted at rest and after stimulation  Comfort (Breast/Nipple):  1 = Filling, red/small blisters or bruises, mild/mod discomfort  Hold (Positioning):  1 = Assistance needed to correctly position infant at breast and maintain latch  LATCH score:  8  Attached assessment:  Deep  Lips flanged:   Yes.    Lips untucked:  No  Suck assessment:  Nutritive   Pre-feed weight:  2748 g  (6 lb. 0.9 oz.) Post-feed weight:  2776 g (6 lb. 1.9 oz.) Amount transferred:  28 ml Amount supplemented:  0 ml  Additional Feeding Assessment -   Infant's oral assessment:  WNL  Positioning:  Cradle Right breast  LATCH documentation:  Latch:  2 = Grasps breast easily, tongue down, lips flanged, rhythmical sucking.  Audible swallowing:  2 = Spontaneous and intermittent  Type of nipple:  2 = Everted at rest and after stimulation  Comfort (Breast/Nipple):  1 = Filling, red/small blisters or bruises, mild/mod discomfort  Hold (Positioning):  2 = No assistance needed to correctly position infant at breast  LATCH score:  9  Attached assessment:  Deep  Lips flanged:  Yes.    Lips untucked:  No.  Suck assessment:  Nutritive   Pre-feed weight:  2776 g  (6 lb. 1.9 oz.) Post-feed weight:  2808 g (6 lb. 3.1 oz.) Amount transferred:  32 ml Amount supplemented:  0 ml   Total amount pumped post feed:  R 4 oz   L 30 ml  Total amount transferred:  60 ml Total supplement given:  0 ml  Mom reports baby has been nursing better. Mature milk coming in. Baby last fed about 4 hours ago. Mom has not pumped since then. Breasts full especially the right Mom has been bottle feeding EBM and formula after most feedings. Baby took a few attempts then latched well and nursed for 15 min. Burped, weighed and latched to other breast easily. Encouraged mom to massage and compress breasts while nursing,Reviewed importance of emptying breasts. Mom pumped and obtained 4 oz from right breast and 1 oz from left. #26 flange given  and mom states  that feels better.Reports breasts feel much better. Encouraged to always breast feed first and then pump to soften breast if baby not emptying breast. Baby off to sleep, content. No further questions at present. To see Ped next Wednesday. To call prn

## 2015-05-29 ENCOUNTER — Encounter (HOSPITAL_COMMUNITY): Payer: Self-pay | Admitting: Emergency Medicine

## 2015-05-29 ENCOUNTER — Emergency Department (HOSPITAL_COMMUNITY)
Admission: EM | Admit: 2015-05-29 | Discharge: 2015-05-29 | Disposition: A | Discharge status: Home / Self Care | Payer: 59 | Attending: Emergency Medicine | Admitting: Emergency Medicine

## 2015-05-29 DIAGNOSIS — Z3202 Encounter for pregnancy test, result negative: Secondary | ICD-10-CM | POA: Diagnosis not present

## 2015-05-29 DIAGNOSIS — Z8619 Personal history of other infectious and parasitic diseases: Secondary | ICD-10-CM | POA: Diagnosis not present

## 2015-05-29 DIAGNOSIS — F321 Major depressive disorder, single episode, moderate: Secondary | ICD-10-CM | POA: Diagnosis present

## 2015-05-29 DIAGNOSIS — R45851 Suicidal ideations: Secondary | ICD-10-CM | POA: Diagnosis present

## 2015-05-29 DIAGNOSIS — F329 Major depressive disorder, single episode, unspecified: Secondary | ICD-10-CM | POA: Insufficient documentation

## 2015-05-29 DIAGNOSIS — Z79899 Other long term (current) drug therapy: Secondary | ICD-10-CM | POA: Insufficient documentation

## 2015-05-29 LAB — COMPREHENSIVE METABOLIC PANEL
ALT: 19 U/L (ref 14–54)
AST: 24 U/L (ref 15–41)
Albumin: 4.4 g/dL (ref 3.5–5.0)
Alkaline Phosphatase: 54 U/L (ref 38–126)
Anion gap: 8 (ref 5–15)
BUN: 14 mg/dL (ref 6–20)
CO2: 22 mmol/L (ref 22–32)
Calcium: 9.5 mg/dL (ref 8.9–10.3)
Chloride: 108 mmol/L (ref 101–111)
Creatinine, Ser: 0.72 mg/dL (ref 0.44–1.00)
GFR calc Af Amer: >60 mL/min (ref >60)
GFR calc non Af Amer: >60 mL/min (ref >60)
Glucose, Bld: 85 mg/dL (ref 65–99)
Potassium: 3.8 mmol/L (ref 3.5–5.1)
Sodium: 138 mmol/L (ref 135–145)
Total Bilirubin: 0.6 mg/dL (ref 0.3–1.2)
Total Protein: 8.2 g/dL — ABNORMAL HIGH (ref 6.5–8.1)

## 2015-05-29 LAB — ETHANOL: Alcohol, Ethyl (B): <5 mg/dL (ref <5)

## 2015-05-29 LAB — CBC
HCT: 39.2 % (ref 36.0–46.0)
Hemoglobin: 13.5 g/dL (ref 12.0–15.0)
MCH: 28.7 pg (ref 26.0–34.0)
MCHC: 34.4 g/dL (ref 30.0–36.0)
MCV: 83.2 fL (ref 78.0–100.0)
Platelets: 146 10*3/uL — ABNORMAL LOW (ref 150–400)
RBC: 4.71 MIL/uL (ref 3.87–5.11)
RDW: 13.9 % (ref 11.5–15.5)
WBC: 4 10*3/uL (ref 4.0–10.5)

## 2015-05-29 LAB — RAPID URINE DRUG SCREEN, HOSP PERFORMED
Amphetamines: NOT DETECTED
Barbiturates: NOT DETECTED
Benzodiazepines: NOT DETECTED
Cocaine: NOT DETECTED
Opiates: NOT DETECTED
Tetrahydrocannabinol: NOT DETECTED

## 2015-05-29 LAB — I-STAT BETA HCG BLOOD, ED (MC, WL, AP ONLY): I-stat hCG, quantitative: <5 m[IU]/mL (ref <5)
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
## 2015-05-29 NOTE — Discharge Instructions (Signed)
Pt denies Si and HI and contracts for safety. She will leave with her sister. Pt requested a work note for missed work today.

## 2015-05-29 NOTE — [facility] Suicide Risk Assessment (Signed)
[facility] Discharge Suicide Risk Assessment   Demographic Factors:  Adolescent or young adult and Low socioeconomic status  Total Time spent with patient: 25 minutes  Musculoskeletal: Strength & Muscle Tone: within normal limits Gait & Station: normal Patient leans: N/A  Psychiatric Specialty Exam:     Blood pressure 121/76, pulse 92, temperature 98 F (36.7 C), temperature source Oral, resp. rate 16, last menstrual period 05/28/2015, SpO2 100 %, not currently breastfeeding.There is no weight on file to calculate BMI.      General Appearance: Casual and Fairly Groomed  Patent attorney:: Good  Speech: Clear and Coherent and Normal Rate  Volume: Normal  Mood: Anxious  Affect: Appropriate and Congruent  Thought Process: Coherent, Goal Directed, Intact, Linear and Logical  Orientation: Full (Time, Place, and Person)  Thought Content: WDL  Suicidal Thoughts: No  Homicidal Thoughts: No  Memory: Immediate; Fair Recent; Fair Remote; Fair  Judgement: Fair  Insight: Fair  Psychomotor Activity: Normal  Concentration: Good  Recall: Good  Fund of Knowledge:Good  Language: Good  Akathisia: No  Handed:   AIMS (if indicated):    Assets: Communication Skills Desire for Improvement Financial Resources/Insurance Housing Physical Health Resilience Social Support  ADL's: Intact  Cognition: WNL  Sleep:             Has this patient used any form of tobacco in the last 30 days? (Cigarettes, Smokeless Tobacco, Cigars, and/or Pipes) No  Mental Status Per Nursing Assessment::   On Admission:     Current Mental Status by Physician: Pt is alert/oriented x4, calm with intermittent situational anxiety, cooperative, and appropriate during assessment. Pt reports that she is concerned about not having a place to live with her 71 month old baby but her sister arrived in the ED and offered housing. Pt is able to contract for safety and  reports that she has no intent to harm herself; she reports that the thoughts of self-harm have resolved. Pt denies suicidal ideation, homicidal ideation, and psychosis, and does not appear to be responding to internal stimuli. Pt has current outpatient psychiatric and counseling services and will follow-up with these providers.   Loss Factors: Relationship strain  Historical Factors: NA  Risk Reduction Factors:   Responsible for children under 9 years of age, Sense of responsibility to family, Living with another person, especially a relative, Positive social support, Positive therapeutic relationship and Positive coping skills or problem solving skills  Continued Clinical Symptoms:  Depression:   Hopelessness  Cognitive Features That Contribute To Risk:  Polarized thinking    Suicide Risk:  Minimal: No identifiable suicidal ideation.  Patients presenting with no risk factors but with morbid ruminations; may be classified as minimal risk based on the severity of the depressive symptoms  Principal Problem: Depression, major, single episode, moderate Discharge Diagnoses:  Patient Active Problem List   Diagnosis Date Noted  . Depression, major, single episode, moderate [F32.1] 05/29/2015    Priority: High  . Status post primary low transverse cesarean section [Z98.89] 02/04/2015  . Low lying placenta without hemorrhage, antepartum [O44.00] 02/02/2015  . IUGR (intrauterine growth restriction) [P05.9] 02/02/2015  . Gestational thrombocytopenia without hemorrhage [O99.119, D69.6] 02/02/2015  . Hepatitis B infection [B16.9] 05/01/2014      Plan Of Care/Follow-up recommendations:  Activity:  As tolerated Diet:  Heart healthy with low sodium   Is patient on multiple antipsychotic therapies at discharge:  No   Has Patient had three or more failed trials of antipsychotic monotherapy by  history:  No  Recommended Plan for Multiple Antipsychotic Therapies: NA  Beau Fanny,  FNP-BC 05/29/2015, 11:37 AM

## 2015-05-29 NOTE — ED Provider Notes (Signed)
CSN: [id number]     Arrival date & time 05/29/15  4098 History   First MD Initiated Contact with Patient 05/29/15 [phone]     Chief Complaint  Patient presents with  . Suicidal     (Consider location/radiation/quality/duration/timing/severity/associated sxs/prior Treatment) HPI Comments: Patient here complaining of increased depression with suicidal ideations without a definitive plan. No prior history of suicide attempt but has been treated for depression in the past. Denies any ingestions currently. Denies any command hallucinations. No recent illnesses. Does have a therapist which she has not been seen on a regular basis. Has not been taking her Zoloft as prescribed. Patient is seeking assistance at this time  The history is provided by the patient.    Past Medical History  Diagnosis Date  . Hepatitis B   . Headache   . Placenta previa 01/10/2015  . Depression   . Gestational thrombocytopenia    Past Surgical History  Procedure Laterality Date  . No past surgeries    . Cesarean section N/A 02/03/2015    Procedure: CESAREAN SECTION;  Surgeon: Myna Hidalgo, DO;  Location: [facility];  Service: Obstetrics;  Laterality: N/A;   Family History  Problem Relation Age of Onset  . Hypertension Mother    Social History  Substance Use Topics  . Smoking status: Never Smoker   . Smokeless tobacco: Never Used  . Alcohol Use: No   OB History    Gravida Para Term Preterm AB TAB SAB Ectopic Multiple Living   1 1 1       0 1     Review of Systems  All other systems reviewed and are negative.     Allergies  Tylenol  Home Medications   Prior to Admission medications   Medication Sig Start Date End Date Taking? Authorizing Provider  ferrous sulfate 325 (65 FE) MG tablet Take 1 tablet (325 mg total) by mouth 2 (two) times daily with a meal. 02/06/15   Myna Hidalgo, DO  ibuprofen (ADVIL,MOTRIN) 600 MG tablet Take 1 tablet (600 mg total) by mouth every 6 (six) hours as needed. 02/06/15    Myna Hidalgo, DO  oxyCODONE-acetaminophen (PERCOCET/ROXICET) 5-325 MG per tablet Take 1 tablet by mouth every 8 (eight) hours as needed for moderate pain (for pain scale 4-7). 02/06/15   Myna Hidalgo, DO  prenatal vitamin w/FE, FA (NATACHEW) 29-1 MG CHEW chewable tablet Chew 1 tablet by mouth daily at 12 noon. 06/23/14   Dorathy Kinsman, CNM  sertraline (ZOLOFT) 25 MG tablet Take 1 tablet (25 mg total) by mouth daily. 02/06/15   Myna Hidalgo, DO   BP 121/76 mmHg  Pulse 92  Temp(Src) 98 F (36.7 C) (Oral)  Resp 16  SpO2 100%  LMP 05/28/2015 (Exact Date)  Breastfeeding? No Physical Exam  Constitutional: She is oriented to person, place, and time. She appears well-developed and well-nourished.  Non-toxic appearance. No distress.  HENT:  Head: Normocephalic and atraumatic.  Eyes: Conjunctivae, EOM and lids are normal. Pupils are equal, round, and reactive to light.  Neck: Normal range of motion. Neck supple. No tracheal deviation present. No thyroid mass present.  Cardiovascular: Normal rate, regular rhythm and normal heart sounds.  Exam reveals no gallop.   No murmur heard. Pulmonary/Chest: Effort normal and breath sounds normal. No stridor. No respiratory distress. She has no decreased breath sounds. She has no wheezes. She has no rhonchi. She has no rales.  Abdominal: Soft. Normal appearance and bowel sounds are normal. She exhibits no distension. There is  no tenderness. There is no rebound and no CVA tenderness.  Musculoskeletal: Normal range of motion. She exhibits no edema or tenderness.  Neurological: She is alert and oriented to person, place, and time. She has normal strength. No cranial nerve deficit or sensory deficit. GCS eye subscore is 4. GCS verbal subscore is 5. GCS motor subscore is 6.  Skin: Skin is warm and dry. No abrasion and no rash noted.  Psychiatric: Her speech is normal and behavior is normal. Her affect is blunt. She expresses suicidal ideation. She expresses no  homicidal plans.  Nursing note and vitals reviewed.   ED Course  Procedures (including critical care time) Labs Review Labs Reviewed  COMPREHENSIVE METABOLIC PANEL  ETHANOL  CBC  URINE RAPID DRUG SCREEN, HOSP PERFORMED  I-STAT BETA HCG BLOOD, ED ([facility], [facility], [facility] ONLY)    Imaging Review No results found. I, Toy Baker, personally reviewed and evaluated these images and lab results as part of my medical decision-making.   EKG Interpretation None      MDM   Final diagnoses:  None    Patient is medically cleared and will be evaluated by behavior health    Lorre Nick, MD 05/29/15 [phone]
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
## 2015-06-17 ENCOUNTER — Telehealth (HOSPITAL_COMMUNITY): Payer: Self-pay | Admitting: Licensed Clinical Social Worker

## 2015-06-22 ENCOUNTER — Ambulatory Visit (HOSPITAL_COMMUNITY): Payer: Self-pay | Admitting: Licensed Clinical Social Worker

## 2015-06-23 NOTE — Telephone Encounter (Signed)
[id number]

## 2015-06-29 ENCOUNTER — Encounter (HOSPITAL_COMMUNITY): Payer: Self-pay | Admitting: Licensed Clinical Social Worker

## 2015-06-29 ENCOUNTER — Ambulatory Visit (INDEPENDENT_AMBULATORY_CARE_PROVIDER_SITE_OTHER): Payer: 59 | Admitting: Licensed Clinical Social Worker

## 2015-06-29 DIAGNOSIS — F330 Major depressive disorder, recurrent, mild: Secondary | ICD-10-CM | POA: Diagnosis not present
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
## 2015-07-03 ENCOUNTER — Emergency Department (HOSPITAL_COMMUNITY)
Admission: EM | Admit: 2015-07-03 | Discharge: 2015-07-03 | Disposition: A | Discharge status: Home / Self Care | Payer: 59 | Attending: Emergency Medicine | Admitting: Emergency Medicine

## 2015-07-03 ENCOUNTER — Encounter (HOSPITAL_COMMUNITY): Payer: Self-pay | Admitting: Emergency Medicine

## 2015-07-03 DIAGNOSIS — Z8619 Personal history of other infectious and parasitic diseases: Secondary | ICD-10-CM | POA: Diagnosis not present

## 2015-07-03 DIAGNOSIS — Z862 Personal history of diseases of the blood and blood-forming organs and certain disorders involving the immune mechanism: Secondary | ICD-10-CM | POA: Diagnosis not present

## 2015-07-03 DIAGNOSIS — Z8659 Personal history of other mental and behavioral disorders: Secondary | ICD-10-CM | POA: Insufficient documentation

## 2015-07-03 DIAGNOSIS — Z3202 Encounter for pregnancy test, result negative: Secondary | ICD-10-CM | POA: Diagnosis not present

## 2015-07-03 DIAGNOSIS — R1033 Periumbilical pain: Secondary | ICD-10-CM | POA: Diagnosis not present

## 2015-07-03 DIAGNOSIS — R630 Anorexia: Secondary | ICD-10-CM | POA: Insufficient documentation

## 2015-07-03 DIAGNOSIS — R112 Nausea with vomiting, unspecified: Secondary | ICD-10-CM | POA: Insufficient documentation

## 2015-07-03 DIAGNOSIS — R197 Diarrhea, unspecified: Secondary | ICD-10-CM | POA: Insufficient documentation

## 2015-07-03 LAB — CBC WITH DIFFERENTIAL/PLATELET
Basophils Absolute: 0 10*3/uL (ref 0.0–0.1)
Basophils Relative: 1 %
Eosinophils Absolute: 0.1 10*3/uL (ref 0.0–0.7)
Eosinophils Relative: 3 %
HCT: 38.8 % (ref 36.0–46.0)
Hemoglobin: 13.1 g/dL (ref 12.0–15.0)
Lymphocytes Relative: 43 %
Lymphs Abs: 1.9 10*3/uL (ref 0.7–4.0)
MCH: 28 pg (ref 26.0–34.0)
MCHC: 33.8 g/dL (ref 30.0–36.0)
MCV: 82.9 fL (ref 78.0–100.0)
Monocytes Absolute: 0.3 10*3/uL (ref 0.1–1.0)
Monocytes Relative: 6 %
Neutro Abs: 2.1 10*3/uL (ref 1.7–7.7)
Neutrophils Relative %: 47 %
Platelets: 140 10*3/uL — ABNORMAL LOW (ref 150–400)
RBC: 4.68 MIL/uL (ref 3.87–5.11)
RDW: 13.5 % (ref 11.5–15.5)
WBC: 4.4 10*3/uL (ref 4.0–10.5)

## 2015-07-03 LAB — URINALYSIS, ROUTINE W REFLEX MICROSCOPIC
Bilirubin Urine: NEGATIVE
Glucose, UA: NEGATIVE mg/dL
Hgb urine dipstick: NEGATIVE
Ketones, ur: NEGATIVE mg/dL
Leukocytes, UA: NEGATIVE
Nitrite: NEGATIVE
Protein, ur: NEGATIVE mg/dL
Specific Gravity, Urine: 1.012 (ref 1.005–1.030)
Urobilinogen, UA: 0.2 mg/dL (ref 0.0–1.0)
pH: 7 (ref 5.0–8.0)

## 2015-07-03 LAB — COMPREHENSIVE METABOLIC PANEL
ALT: 21 U/L (ref 14–54)
AST: 25 U/L (ref 15–41)
Albumin: 4.3 g/dL (ref 3.5–5.0)
Alkaline Phosphatase: 45 U/L (ref 38–126)
Anion gap: 10 (ref 5–15)
BUN: 13 mg/dL (ref 6–20)
CO2: 20 mmol/L — ABNORMAL LOW (ref 22–32)
Calcium: 9.7 mg/dL (ref 8.9–10.3)
Chloride: 108 mmol/L (ref 101–111)
Creatinine, Ser: 0.82 mg/dL (ref 0.44–1.00)
GFR calc Af Amer: >60 mL/min (ref >60)
GFR calc non Af Amer: >60 mL/min (ref >60)
Glucose, Bld: 87 mg/dL (ref 65–99)
Potassium: 4.1 mmol/L (ref 3.5–5.1)
Sodium: 138 mmol/L (ref 135–145)
Total Bilirubin: 0.9 mg/dL (ref 0.3–1.2)
Total Protein: 8.6 g/dL — ABNORMAL HIGH (ref 6.5–8.1)

## 2015-07-03 LAB — PREGNANCY, URINE: Preg Test, Ur: NEGATIVE

## 2015-07-03 LAB — LIPASE, BLOOD: Lipase: 31 U/L (ref 22–51)

## 2015-07-03 MED ORDER — ONDANSETRON HCL 4 MG/2ML IJ SOLN
4.0000 mg | Freq: Once | INTRAMUSCULAR | Status: AC
  Administered 2015-07-03: 4 mg via INTRAVENOUS
  Filled 2015-07-03: qty 2

## 2015-07-03 MED ORDER — ONDANSETRON HCL 4 MG PO TABS: 4.0000 mg | ORAL_TABLET | Freq: Four times a day (QID) | ORAL | Status: DC

## 2015-07-03 MED ORDER — SODIUM CHLORIDE 0.9 % IV BOLUS (SEPSIS)
1000.0000 mL | Freq: Once | INTRAVENOUS | Status: AC
  Administered 2015-07-03: 1000 mL via INTRAVENOUS

## 2015-07-03 NOTE — ED Provider Notes (Signed)
CSN: [id number]     Arrival date & time 07/03/15  7829 History   First MD Initiated Contact with Patient 07/03/15 [phone]     Chief Complaint  Patient presents with  . Abdominal Pain     (Consider location/radiation/quality/duration/timing/severity/associated sxs/prior Treatment) HPI Comments: Patient awoke with severe abdominal pain around 1 AM. Pain is constant and does not radiate. It is mostly periumbilical. Is associated with nausea and "spitting" but no vomiting. She's had 3 episodes of loose watery stools. Denies any blood in the stool. Denies any fever. Denies any sick contacts. Denies any recent antibiotics use or recent travel. She ate some chicken last night which she thinks may be making her sick. She reports a history of "inactive hepatitis B" and recent cesarean section 5 months ago. No other abdominal surgeries. No urinary or vaginal symptoms. She states it burns when she has diarrhea.  Patient is a 24 y.o. female presenting with abdominal pain. The history is provided by the patient.  Abdominal Pain Associated symptoms: nausea   Associated symptoms: no chest pain, no cough, no diarrhea, no dysuria, no fever, no hematuria, no shortness of breath, no vaginal bleeding, no vaginal discharge and no vomiting     Past Medical History  Diagnosis Date  . Hepatitis B   . Headache   . Placenta previa 01/10/2015  . Depression   . Gestational thrombocytopenia    Past Surgical History  Procedure Laterality Date  . No past surgeries    . Cesarean section N/A 02/03/2015    Procedure: CESAREAN SECTION;  Surgeon: Myna Hidalgo, DO;  Location: [facility];  Service: Obstetrics;  Laterality: N/A;   Family History  Problem Relation Age of Onset  . Hypertension Mother    Social History  Substance Use Topics  . Smoking status: Never Smoker   . Smokeless tobacco: Never Used  . Alcohol Use: No   OB History    Gravida Para Term Preterm AB TAB SAB Ectopic Multiple Living   0 1      Review of Systems  Constitutional: Positive for activity change and appetite change. Negative for fever.  HENT: Negative for congestion.   Respiratory: Negative for cough, chest tightness and shortness of breath.   Cardiovascular: Negative for chest pain.  Gastrointestinal: Positive for nausea and abdominal pain. Negative for vomiting and diarrhea.  Genitourinary: Negative for dysuria, hematuria, vaginal bleeding and vaginal discharge.  Musculoskeletal: Negative for myalgias and arthralgias.  Skin: Negative for rash.  Neurological: Negative for dizziness, weakness and headaches.  A complete 10 system review of systems was obtained and all systems are negative except as noted in the HPI and PMH.      Allergies  Review of patient's allergies indicates no known allergies.  Home Medications   Prior to Admission medications   Medication Sig Start Date End Date Taking? Authorizing Provider  ondansetron (ZOFRAN) 4 MG tablet Take 1 tablet (4 mg total) by mouth every 6 (six) hours. 07/03/15   Glynn Octave, MD   BP 103/53 mmHg  Pulse 85  Temp(Src) 98.6 F (37 C) (Oral)  Resp 14  Ht  (1.676 m)  Wt 118 lb (53.524 kg)  BMI 19.05 kg/m2  SpO2 100% Physical Exam  Constitutional: She is oriented to person, place, and time. She appears well-developed and well-nourished. No distress.  HENT:  Head: Normocephalic and atraumatic.  Mouth/Throat: Oropharynx is clear and moist. No oropharyngeal exudate.  Eyes: Conjunctivae and EOM  are normal. Pupils are equal, round, and reactive to light.  Neck: Normal range of motion. Neck supple.  No meningismus.  Cardiovascular: Normal rate, regular rhythm, normal heart sounds and intact distal pulses.   No murmur heard. Pulmonary/Chest: Effort normal and breath sounds normal. No respiratory distress.  Abdominal: Soft. There is tenderness. There is no rebound and no guarding.  Mild periumbilical pain, no guarding or rebound. No pain at McBurney's  point, negative Murphy's sign  Musculoskeletal: Normal range of motion. She exhibits no edema or tenderness.  No CVAT  Neurological: She is alert and oriented to person, place, and time. No cranial nerve deficit. She exhibits normal muscle tone. Coordination normal.  No ataxia on finger to nose bilaterally. No pronator drift. 5/5 strength throughout. CN 2-12 intact. Negative Romberg. Equal grip strength. Sensation intact. Gait is normal.   Skin: Skin is warm.  Psychiatric: She has a normal mood and affect. Her behavior is normal.  Nursing note and vitals reviewed.   ED Course  Procedures (including critical care time) Labs Review Labs Reviewed  CBC WITH DIFFERENTIAL/PLATELET - Abnormal; Notable for the following:    Platelets 140 (*)    All other components within normal limits  COMPREHENSIVE METABOLIC PANEL - Abnormal; Notable for the following:    CO2 20 (*)    Total Protein 8.6 (*)    All other components within normal limits  URINALYSIS, ROUTINE W REFLEX MICROSCOPIC (NOT AT [facility])  PREGNANCY, URINE  LIPASE, BLOOD    Imaging Review No results found. I have personally reviewed and evaluated these images and lab results as part of my medical decision-making.   EKG Interpretation None      MDM   Final diagnoses:  Nausea vomiting and diarrhea   Periumbilical pain with diarrhea and nausea. Abdomen soft without peritoneal signs. No right lower quadrant pain.  Urinalysis negative. HCG negative. Labs unremarkable.  Patient improved after IV and by mouth fluids in the ED. Antiemetics given without any vomiting in the ED. Continues to have some diarrhea. Suspect viral GI illness.  Supportive care with fluids and antibiotics. Follow-up with PCP. Return precautions discussed  Discussed remote possibility of early appendicitis with patient. She needs to return if her pain localizes to her right lower quadrant, she develops fever or vomiting or any other concerns.   Glynn Octave, MD 07/03/15 1225

## 2015-07-03 NOTE — ED Notes (Signed)
Pt reports severe abdominal pain 9/10 at 0100 with nausea and diarrhea x1. Pt denies vomitting. Pt A&OX4, NAD noted. Pt speaks english.

## 2015-07-03 NOTE — Discharge Instructions (Signed)
Viral Gastroenteritis Keep yourself hydrated. Return to the ED if you develop worsening pain that moved to the right side of your abdomen, fever, vomiting or any other concerns. Viral gastroenteritis is also known as stomach flu. This condition affects the stomach and intestinal tract. It can cause sudden diarrhea and vomiting. The illness typically lasts 3 to 8 days. Most people develop an immune response that eventually gets rid of the virus. While this natural response develops, the virus can make you quite ill. CAUSES  Many different viruses can cause gastroenteritis, such as rotavirus or noroviruses. You can catch one of these viruses by consuming contaminated food or water. You may also catch a virus by sharing utensils or other personal items with an infected person or by touching a contaminated surface. SYMPTOMS  The most common symptoms are diarrhea and vomiting. These problems can cause a severe loss of body fluids (dehydration) and a body salt (electrolyte) imbalance. Other symptoms may include:  Fever.  Headache.  Fatigue.  Abdominal pain. DIAGNOSIS  Your caregiver can usually diagnose viral gastroenteritis based on your symptoms and a physical exam. A stool sample may also be taken to test for the presence of viruses or other infections. TREATMENT  This illness typically goes away on its own. Treatments are aimed at rehydration. The most serious cases of viral gastroenteritis involve vomiting so severely that you are not able to keep fluids down. In these cases, fluids must be given through an intravenous line (IV). HOME CARE INSTRUCTIONS   Drink enough fluids to keep your urine clear or pale yellow. Drink small amounts of fluids frequently and increase the amounts as tolerated.  Ask your caregiver for specific rehydration instructions.  Avoid:  Foods high in sugar.  Alcohol.  Carbonated drinks.  Tobacco.  Juice.  Caffeine drinks.  Extremely hot or cold  fluids.  Fatty, greasy foods.  Too much intake of anything at one time.  Dairy products until 24 to 48 hours after diarrhea stops.  You may consume probiotics. Probiotics are active cultures of beneficial bacteria. They may lessen the amount and number of diarrheal stools in adults. Probiotics can be found in yogurt with active cultures and in supplements.  Wash your hands well to avoid spreading the virus.  Only take over-the-counter or prescription medicines for pain, discomfort, or fever as directed by your caregiver. Do not give aspirin to children. Antidiarrheal medicines are not recommended.  Ask your caregiver if you should continue to take your regular prescribed and over-the-counter medicines.  Keep all follow-up appointments as directed by your caregiver. SEEK IMMEDIATE MEDICAL CARE IF:   You are unable to keep fluids down.  You do not urinate at least once every 6 to 8 hours.  You develop shortness of breath.  You notice blood in your stool or vomit. This may look like coffee grounds.  You have abdominal pain that increases or is concentrated in one small area (localized).  You have persistent vomiting or diarrhea.  You have a fever.  The patient is a child younger than 3 months, and he or she has a fever.  The patient is a child older than 3 months, and he or she has a fever and persistent symptoms.  The patient is a child older than 3 months, and he or she has a fever and symptoms suddenly get worse.  The patient is a baby, and he or she has no tears when crying. MAKE SURE YOU:   Understand these instructions.  Will  watch your condition.  Will get help right away if you are not doing well or get worse. Document Released: 10/02/2005 Document Revised: 12/25/2011 Document Reviewed: 07/19/2011 [facility] Patient Information 2015 [place], [place]. This information is not intended to replace advice given to you by your health care provider. Make sure you discuss  any questions you have with your health care provider.

## 2015-07-03 NOTE — ED Notes (Signed)
Pt eating crackers and drinking water, tolerating well.

## 2015-07-08 ENCOUNTER — Ambulatory Visit
Admission: RE | Admit: 2015-07-08 | Discharge: 2015-07-08 | Disposition: A | Discharge status: Home / Self Care | Payer: No Typology Code available for payment source | Source: Ambulatory Visit | Attending: Infectious Disease | Admitting: Infectious Disease

## 2015-07-08 ENCOUNTER — Other Ambulatory Visit: Payer: Self-pay | Admitting: Infectious Disease

## 2015-07-08 DIAGNOSIS — R7611 Nonspecific reaction to tuberculin skin test without active tuberculosis: Secondary | ICD-10-CM

## 2015-07-14 ENCOUNTER — Ambulatory Visit (HOSPITAL_COMMUNITY): Payer: Self-pay | Admitting: Psychology

## 2015-07-22 ENCOUNTER — Ambulatory Visit (HOSPITAL_COMMUNITY): Payer: Self-pay | Admitting: Psychiatry

## 2015-08-08 ENCOUNTER — Encounter (HOSPITAL_COMMUNITY): Payer: Self-pay | Admitting: *Deleted

## 2015-08-08 ENCOUNTER — Emergency Department (HOSPITAL_COMMUNITY)
Admission: EM | Admit: 2015-08-08 | Discharge: 2015-08-08 | Disposition: A | Discharge status: Home / Self Care | Payer: 59 | Attending: Emergency Medicine | Admitting: Emergency Medicine

## 2015-08-08 DIAGNOSIS — F329 Major depressive disorder, single episode, unspecified: Secondary | ICD-10-CM | POA: Insufficient documentation

## 2015-08-08 DIAGNOSIS — G43909 Migraine, unspecified, not intractable, without status migrainosus: Secondary | ICD-10-CM | POA: Diagnosis not present

## 2015-08-08 DIAGNOSIS — Z3202 Encounter for pregnancy test, result negative: Secondary | ICD-10-CM | POA: Diagnosis not present

## 2015-08-08 DIAGNOSIS — Z8619 Personal history of other infectious and parasitic diseases: Secondary | ICD-10-CM | POA: Diagnosis not present

## 2015-08-08 DIAGNOSIS — F32A Depression, unspecified: Secondary | ICD-10-CM

## 2015-08-08 DIAGNOSIS — R51 Headache: Secondary | ICD-10-CM | POA: Diagnosis present

## 2015-08-08 LAB — BASIC METABOLIC PANEL
Anion gap: 5 (ref 5–15)
BUN: 9 mg/dL (ref 6–20)
CO2: 24 mmol/L (ref 22–32)
Calcium: 9.5 mg/dL (ref 8.9–10.3)
Chloride: 108 mmol/L (ref 101–111)
Creatinine, Ser: 0.73 mg/dL (ref 0.44–1.00)
GFR calc Af Amer: >60 mL/min (ref >60)
GFR calc non Af Amer: >60 mL/min (ref >60)
Glucose, Bld: 88 mg/dL (ref 65–99)
Potassium: 3.7 mmol/L (ref 3.5–5.1)
Sodium: 137 mmol/L (ref 135–145)

## 2015-08-08 LAB — CBC WITH DIFFERENTIAL/PLATELET
Basophils Absolute: 0 10*3/uL (ref 0.0–0.1)
Basophils Relative: 1 %
Eosinophils Absolute: 0.2 10*3/uL (ref 0.0–0.7)
Eosinophils Relative: 5 %
HCT: 39.3 % (ref 36.0–46.0)
Hemoglobin: 13.6 g/dL (ref 12.0–15.0)
Lymphocytes Relative: 58 %
Lymphs Abs: 2.3 10*3/uL (ref 0.7–4.0)
MCH: 28.9 pg (ref 26.0–34.0)
MCHC: 34.6 g/dL (ref 30.0–36.0)
MCV: 83.4 fL (ref 78.0–100.0)
Monocytes Absolute: 0.3 10*3/uL (ref 0.1–1.0)
Monocytes Relative: 7 %
Neutro Abs: 1.1 10*3/uL — ABNORMAL LOW (ref 1.7–7.7)
Neutrophils Relative %: 29 %
Platelets: 149 10*3/uL — ABNORMAL LOW (ref 150–400)
RBC: 4.71 MIL/uL (ref 3.87–5.11)
RDW: 13.1 % (ref 11.5–15.5)
WBC: 3.9 10*3/uL — ABNORMAL LOW (ref 4.0–10.5)

## 2015-08-08 LAB — I-STAT BETA HCG BLOOD, ED (MC, WL, AP ONLY): I-stat hCG, quantitative: <5 m[IU]/mL (ref <5)

## 2015-08-08 MED ORDER — DEXAMETHASONE SODIUM PHOSPHATE 10 MG/ML IJ SOLN
4.0000 mg | Freq: Once | INTRAMUSCULAR | Status: AC
  Administered 2015-08-08: 4 mg via INTRAVENOUS
  Filled 2015-08-08: qty 1

## 2015-08-08 MED ORDER — METOCLOPRAMIDE HCL 5 MG/ML IJ SOLN
10.0000 mg | Freq: Once | INTRAMUSCULAR | Status: AC
  Administered 2015-08-08: 10 mg via INTRAVENOUS
  Filled 2015-08-08: qty 2

## 2015-08-08 MED ORDER — DIPHENHYDRAMINE HCL 50 MG/ML IJ SOLN
25.0000 mg | Freq: Once | INTRAMUSCULAR | Status: AC
  Administered 2015-08-08: 25 mg via INTRAVENOUS
  Filled 2015-08-08: qty 1

## 2015-08-08 MED ORDER — METOCLOPRAMIDE HCL 10 MG PO TABS: 10.0000 mg | ORAL_TABLET | Freq: Four times a day (QID) | ORAL | Status: DC | PRN

## 2015-08-08 MED ORDER — SODIUM CHLORIDE 0.9 % IV BOLUS (SEPSIS)
1000.0000 mL | Freq: Once | INTRAVENOUS | Status: AC
  Administered 2015-08-08: 1000 mL via INTRAVENOUS

## 2015-08-08 NOTE — ED Notes (Signed)
Pt presents via POV c/o headache x 1 day and feeling depressed.  Denies SI/HI.  Reports chronic headaches and sees a Neurologist for this.  Pt a x 4, NAD, denies blurry vision, weakness, N/V.

## 2015-08-08 NOTE — Discharge Instructions (Signed)
Take tylenol, motrin for headaches.   Take reglan for severe headaches.   Call your insurance to find out list of doctors that take your insurance.   Return to ER if you have worse headaches, depression, thoughts of harming yourself or others.    Emergency Department Resource Guide 1) Find a Doctor and Pay Out of Pocket Although you won't have to find out who is covered by your insurance plan, it is a good idea to ask around and get recommendations. You will then need to call the office and see if the doctor you have chosen will accept you as a new patient and what types of options they offer for patients who are self-pay. Some doctors offer discounts or will set up payment plans for their patients who do not have insurance, but you will need to ask so you aren't surprised when you get to your appointment.  2) Contact Your Local Health Department Not all health departments have doctors that can see patients for sick visits, but many do, so it is worth a call to see if yours does. If you don't know where your local health department is, you can check in your phone book. The CDC also has a tool to help you locate your state's health department, and many state websites also have listings of all of their local health departments.  3) Find a Walk-in Clinic If your illness is not likely to be very severe or complicated, you may want to try a walk in clinic. These are popping up all over the country in pharmacies, drugstores, and shopping centers. They're usually staffed by nurse practitioners or physician assistants that have been trained to treat common illnesses and complaints. They're usually fairly quick and inexpensive. However, if you have serious medical issues or chronic medical problems, these are probably not your best option.  No Primary Care Doctor: - Call Health Connect at  [phone] - they can help you locate a primary care doctor that  accepts your insurance, provides certain services,  etc. - Physician Referral Service- [phone]  Chronic Pain Problems: Organization         Address  Phone   Notes  [facility]  [phone]) [phone] Patients need to be referred by their primary care doctor.   Medication Assistance: Organization         Address  Phone   Notes  [facility] [address] [place], [place] [address] [phone]) [phone] --Must be a resident of [facility] [place] -- Must have NO insurance coverage whatsoever (no Medicaid/ Medicare, etc.) -- The pt. MUST have a primary care doctor that directs their care regularly and follows them in the community   MedAssist  [phone]) [phone]   [employer]  [phone]    Agencies that provide inexpensive medical care: Organization         Address  Phone   Notes  [facility]  [phone]   [facility]    [phone]) [phone]   [facility] [address] [place], [place] [address] [phone]) [phone]   [facility] [address], [place] [phone]   [facility]anned [facility]    [phone]   [facility]    [phone]) [phone]   [facility]  [address], [place] Phone:  [phone], Fax:  [phone] Hours of Operation:  9 am - 6 pm, M-F.  Also accepts Medicaid/Medicare and self-pay.  [facility] for [place] [address] [place], [address], [place] Phone: [phone], Fax: [phone]. Hours of Operation:  8:30 am - 5:30 pm, M-F.  Also accepts Medicaid and self-pay.  [facility] [place] [address] [place] Phone: [phone]   [place], [place], [place] [phone], Ext. [phone] Mondays & Thursdays: 7-9 AM.  First 15 patients are seen on a first come, first serve basis.    [place] Providers:  Organization         Address  Phone   Notes  [facility] [address],  [phone] Also accepts self-pay patients.  [facility] [address], [place]  [phone]   [place], [address], [place] [phone]   [facility] [address] [place] [phone]   Lucianne Lei [address], [place]   [phone] Only accepts [place] Access [place] patients after they have their name applied to their card.   Self-Pay (no insurance) in [facility]:  Organization         Address  Phone   Notes  Sickle Cell Patients, [facility] [place] [phone]   [facility] [place] [phone]   [facility] [place]  [place], [facility], [place] [phone]   [facility]/Dr. Osei-Bonsu  [address], [place] or [address], [place] [phone] Phone number for both [place] and [place] locations is the same.  [facility]gent [facility] [address] [place] [phone]   [facility] [address] [place] or [address] [phone]   [facility] [address], [place] [phone], phone; [phone], fax Sees patients 1st and 3rd Saturday of every month.  Must not qualify for public or private insurance (i.e. Medicaid, Medicare, Becker Health Choice, Veterans' Benefits)  Household income should be no more than 200% of the poverty level The clinic cannot treat you if you are pregnant or think you are pregnant  Sexually transmitted diseases are not treated at the clinic.    Dental Care: Organization         Address  Phone  Notes  [facility] Department of [facility] [phone] Accepts children up to  age 51 who are enrolled in [place] or [place]; pregnant women with a Medicaid card; and children who have applied for Medicaid or [place] Health Choice, but were declined, whose parents can pay a reduced fee at time of service.  [facility] Department of [facility]  [address], [place] [phone] Accepts children up to age 24 who are enrolled in [place] or [place]; pregnant women with a Medicaid card; and children who have applied for Medicaid or [place] Health Choice, but were declined, whose parents can pay a reduced fee at time of service.  [facility] PRO[facility]  [place] [phone] Patients are seen by appointment only. Walk-ins are not accepted. [place] will see patients 54 years of age and older. Monday - Tuesday (  8am-5pm) Most Wednesdays (8:30-5pm) $30 per visit, cash only  [facility] Adult Dental Access PROGRAM  [address], [facility] [phone] Patients are seen by appointment only. Walk-ins are not accepted. [place] will see patients 2 years of age and older. One Wednesday Evening (Monthly: Volunteer Based).  $30 per visit, cash only  [place]  [phone] for adults; Children under age 70, call Graduate Pediatric Dentistry at [phone]. Children aged 26-14, please call [phone] to request a pediatric application.  Dental services are provided in all areas of dental care including fillings, crowns and bridges, complete and partial dentures, implants, gum treatment, root canals, and extractions. Preventive care is also provided. Treatment is provided to both adults and children. Patients are selected via a lottery and there is often a waiting list.   [facility] [address], [place]  [phone] [email or web address]   [facility] [address] [place], [place] [phone], Ext. [phone] Second and Fourth Thursday of  each month, opens at 6:30 AM; Clinic ends at 9 AM.  Patients are seen on a first-come first-served basis, and a limited number are seen during each clinic.   [facility]  [address] Hillard Danker [place], [place] [phone]   Eligibility Requirements You must have lived in [place], [place], or [place] counties for at least the last three months.   You cannot be eligible for state or federal sponsored [employer], including [employer], [place], or [employer].   You generally cannot be eligible for healthcare insurance through your employer.    How to apply: Eligibility screenings are held every Tuesday and Wednesday afternoon from 1:00 pm until 4:00 pm. You do not need an appointment for the interview!  [facility] [address], [place], [place]   [place]  Grants Department  [place]  [phone]    Behavioral Health Resources in the Community: Intensive Outpatient Programs Organization         Address  Phone  Notes  [place]. [address] [place], [place] [phone]   [facility] Outpatient [address], [place], [place]   ADS: Alcohol & Drug Svcs [address] [place], [place]   [place] [address],  [place], [place] or [phone]   Substance Abuse Resources Organization         Address  Phone  Notes  Al[facility] Drug Services  [phone]   [place]  [phone]   [facility] [place]   Chinita Pester  [phone]   Residential & Outpatient Substance Abuse Program  [phone]   Psychological Services Organization         Address  Phone  Notes  [facility] [place]  [place]  [phone]   [place] [address]  [place] or [phone]    Mobile Crisis Teams Organization         Address  Phone  Notes  [facility], Mobile Crisis Care Unit  [phone]   [facility]  [address] [place], [place]   [place] [address], [place] [phone]    Self-Help/Support Groups Organization         Address  Phone  Notes  [facility] [place] - variety of support groups  [place] Call for more information  Narcotics Anonymous (NA), [facility] [address], [employer] [place]  2 meetings at this location   Special educational needs teacher         Address  Phone  Notes  ASAP Residential Treatment [place],    [place]  [phone]   [facility]  [address], [place] [phone], [place], [place]   [place], [place] [phone] Admissions: 8am-3pm M-F  Incentives Substance [place] [address],    [place], [place] [phone]   [facility] [address] [place], [place], [place]   [facility] [address]  [place], [place]   [facility] - Intensive Outpatient [address], [place], [place]   [facility] ([place].,  [place], [place] [phone] or [phone]   [facility] Services (RTS) [address] [place], [place] Accepts Medicaid  [facility] [place] [address],  [place] [phone] Substance Abuse/Addiction Treatment   [facility] Organization         Address  Phone  Notes  [facility]  [phone]   Domenic Schwab, PhD [address] Arlis Porta [place], [place]   [phone] or [phone]   [place], [place] [phone]     [facility] [address], [place], [place] [phone] Insurance/Medicaid/sponsorship through [facility] and Families [address], [address]                                    [place], [place] [phone] [place] [address], [place] [phone]    Dr. Adele Schilder  [phone]   [facility] Dept. 1) [address], [place] 2) [place] 3)  [address], [place] [phone]  [phone]   [place] [phone] or [phone] (After Hours)

## 2015-08-08 NOTE — ED Provider Notes (Signed)
CSN: [id number]     Arrival date & time 08/08/15  [id number] History   First MD Initiated Contact with Patient 08/08/15 0902     Chief Complaint  Patient presents with  . Headache  . Depression     (Consider location/radiation/quality/duration/timing/severity/associated sxs/prior Treatment) The history is provided by the patient.  Wyatt HasteJeneba Imparato is a 24 y.o. female history of gestational thrombocytopenia, chronic migraines here with headache, depression. Patient states that she has gradually onset worsening headaches since yesterday. Denies any nausea or vomiting or blurry vision or weakness. She has been seen Dr. Neale BurlyFreeman from neurology for the headache before and had received injections in the past. However, patient change insurance and has not seen neurology for awhile. Patient also has been feeling depressed. States that she has a lot of social issues right now. He has a baby that was born in April this year. She is under a lot of stress. Denies any thoughts of harming herself or her baby or others. Denies any hallucinations. Not been taking her Zoloft because she states that it does not work and hasn't been seeing her psychiatrist.   Past Medical History  Diagnosis Date  . Hepatitis B   . Headache   . Placenta previa 01/10/2015  . Depression   . Gestational thrombocytopenia [facility](HCC)    Past Surgical History  Procedure Laterality Date  . No past surgeries    . Cesarean section N/A 02/03/2015    Procedure: CESAREAN SECTION;  Surgeon: Myna HidalgoJennifer Ozan, DO;  Location: [facility];  Service: Obstetrics;  Laterality: N/A;   Family History  Problem Relation Age of Onset  . Hypertension Mother    Social History  Substance Use Topics  . Smoking status: Never Smoker   . Smokeless tobacco: Never Used  . Alcohol Use: No   OB History    Gravida Para Term Preterm AB TAB SAB Ectopic Multiple Living   1 1 1       0 1     Review of Systems  Neurological: Positive for headaches.   Psychiatric/Behavioral: Positive for depression and dysphoric mood.  All other systems reviewed and are negative.     Allergies  Review of patient's allergies indicates no known allergies.  Home Medications   Prior to Admission medications   Not on File   BP 111/68 mmHg  Pulse 103  Temp(Src) 98.1 F (36.7 C) (Oral)  Resp 16  Ht 5\' 6"  (1.676 m)  Wt 119 lb (53.978 kg)  BMI 19.22 kg/m2  SpO2 100%  Breastfeeding? No Physical Exam  Constitutional: She is oriented to person, place, and time.  Depressed, slightly uncomfortable   HENT:  Head: Normocephalic.  Mouth/Throat: Oropharynx is clear and moist.  Eyes: Conjunctivae are normal. Pupils are equal, round, and reactive to light.  Nl fundus exam   Neck: Normal range of motion. Neck supple.  Cardiovascular: Normal rate, regular rhythm and normal heart sounds.   Pulmonary/Chest: Effort normal and breath sounds normal. No respiratory distress. She has no wheezes. She has no rales.  Abdominal: Soft. Bowel sounds are normal. She exhibits no distension. There is no tenderness. There is no rebound.  Musculoskeletal: Normal range of motion. She exhibits no edema or tenderness.  Neurological: She is alert and oriented to person, place, and time. No cranial nerve deficit. Coordination normal.  Skin: Skin is warm and dry.  Psychiatric: Her behavior is normal. Judgment and thought content normal.  Depressed, not suicidal   Nursing note and vitals reviewed.   ED  Course  Procedures (including critical care time) Labs Review Labs Reviewed  CBC WITH DIFFERENTIAL/PLATELET - Abnormal; Notable for the following:    WBC 3.9 (*)    Platelets 149 (*)    Neutro Abs 1.1 (*)    All other components within normal limits  BASIC METABOLIC PANEL  I-STAT BETA HCG BLOOD, ED ([facility], [facility], [facility] ONLY)    Imaging Review No results found. I have personally reviewed and evaluated these images and lab results as part of my medical decision-making.   EKG  Interpretation None      MDM   Final diagnoses:  None   Marlana Mckowen is a 24 y.o. female here with headache, depression. Headache gradual onset, likely chronic migraines. No signs of SAH, nl neuro exam. Will give migraine cocktail. She is chronically depressed but denies any suicidal ideations or homicidal ideations. I don't think she meets inpatient psych admission criteria and needs outpatient follow up.   11:37 AM Headache improved. Will dc home. Patient has insurance. Recommend calling insurance to find list of PCP that takes her insurance. Recommend tylenol, motrin, reglan for headaches. Will refer to psych for outpatient follow up.     Richardean Canal, MD 08/08/15 [phone]

## 2015-08-12 ENCOUNTER — Ambulatory Visit (HOSPITAL_COMMUNITY): Payer: Self-pay | Admitting: Psychiatry

## 2015-10-13 ENCOUNTER — Other Ambulatory Visit: Payer: Self-pay | Admitting: Advanced Practice Midwife

## 2016-05-19 ENCOUNTER — Inpatient Hospital Stay (HOSPITAL_COMMUNITY)
Admission: AD | Admit: 2016-05-19 | Discharge: 2016-05-19 | Disposition: A | Discharge status: Home / Self Care | Payer: Medicaid Other | Source: Ambulatory Visit | Attending: Family Medicine | Admitting: Family Medicine

## 2016-05-19 DIAGNOSIS — N912 Amenorrhea, unspecified: Secondary | ICD-10-CM | POA: Insufficient documentation

## 2016-05-19 NOTE — MAU Provider Note (Signed)
S: Robin Nguyen is a 25 y.o. G1P1001 who presents today for a pregnancy test. She has no other concerns. She denies any abdominal pain or bleeding. She reports that she had a +UPT at home earlier today.   O: VSS,  NAD Abd soft  A/P: 1. Amenorrhea    DC home RX: none  Return to MAU as needed FU with OB as planned  Follow-up Information    [facility] for [facility] .   Specialty:  Obstetrics and Gynecology Contact information: [address] [place] [address] [phone]

## 2016-05-19 NOTE — Discharge Instructions (Signed)
Secondary Amenorrhea Secondary amenorrhea is the stopping of menstrual flow for 3-6 months in a female who has previously had periods. There are many possible causes. Most of these causes are not serious. Usually, treating the underlying problem causing the loss of menses will return your periods to normal. CAUSES  Some common and uncommon causes of not menstruating include:  Malnutrition.  Low blood sugar (hypoglycemia).  Polycystic ovary disease.  Stress or fear.  Breastfeeding.  Hormone imbalance.  Ovarian failure.  Medicines.  Extreme obesity.  Cystic fibrosis.  Low body weight or drastic weight reduction from any cause.  Early menopause.  Removal of ovaries or uterus.  Contraceptives.  Illness.  Long-term (chronic) illnesses.  Cushing syndrome.  Thyroid problems.  Birth control pills, patches, or vaginal rings for birth control. RISK FACTORS You may be at greater risk of secondary amenorrhea if:  You have a family history of this condition.  You have an eating disorder.  You do athletic training. DIAGNOSIS  A diagnosis is made by your health care provider taking a medical history and doing a physical exam. This will include a pelvic exam to check for problems with your reproductive organs. Pregnancy must be ruled out. Often, numerous blood tests are done to measure different hormones in the body. Urine testing may be done. Specialized exams (ultrasound, CT scan, MRI, or hysteroscopy) may have to be done as well as measuring the body mass index (BMI). TREATMENT  Treatment depends on the cause of the amenorrhea. If an eating disorder is present, this can be treated with an adequate diet and therapy. Chronic illnesses may improve with treatment of the illness. Amenorrhea may be corrected with medicines, lifestyle changes, or surgery. If the amenorrhea cannot be corrected, it is sometimes possible to create a false menstruation with medicines. HOME CARE  INSTRUCTIONS  Maintain a healthy diet.  Manage weight problems.  Exercise regularly but not excessively.  Get adequate sleep.  Manage stress.  Be aware of changes in your menstrual cycle. Keep a record of when your periods occur. Note the date your period starts, how long it lasts, and any problems. SEEK MEDICAL CARE IF: Your symptoms do not get better with treatment.   This information is not intended to replace advice given to you by your health care provider. Make sure you discuss any questions you have with your health care provider.   Document Released: 11/13/2006 Document Revised: 10/23/2014 Document Reviewed: 03/20/2013 Elsevier Interactive Patient Education 2016 Elsevier Inc.  

## 2016-05-19 NOTE — MAU Note (Signed)
PT  SAYS SHE WANTS  A  PREG  TEST.

## 2016-06-14 LAB — OB RESULTS CONSOLE HIV ANTIBODY (ROUTINE TESTING): HIV: NONREACTIVE

## 2016-06-14 LAB — OB RESULTS CONSOLE GC/CHLAMYDIA
Chlamydia: NEGATIVE
Gonorrhea: NEGATIVE

## 2016-06-14 LAB — OB RESULTS CONSOLE ANTIBODY SCREEN: Antibody Screen: NEGATIVE

## 2016-06-14 LAB — OB RESULTS CONSOLE ABO/RH: RH Type: POSITIVE

## 2016-06-14 LAB — OB RESULTS CONSOLE RUBELLA ANTIBODY, IGM: Rubella: IMMUNE

## 2016-06-14 LAB — OB RESULTS CONSOLE RPR: RPR: NONREACTIVE

## 2016-06-14 LAB — OB RESULTS CONSOLE HEPATITIS B SURFACE ANTIGEN: Hepatitis B Surface Ag: POSITIVE

## 2016-06-19 ENCOUNTER — Inpatient Hospital Stay (HOSPITAL_COMMUNITY)
Admission: AD | Admit: 2016-06-19 | Discharge: 2016-06-19 | Disposition: A | Discharge status: Home / Self Care | Payer: Medicaid Other | Source: Ambulatory Visit | Attending: Obstetrics & Gynecology | Admitting: Obstetrics & Gynecology

## 2016-06-19 ENCOUNTER — Encounter (HOSPITAL_COMMUNITY): Payer: Self-pay | Admitting: *Deleted

## 2016-06-19 DIAGNOSIS — R51 Headache: Secondary | ICD-10-CM | POA: Insufficient documentation

## 2016-06-19 DIAGNOSIS — Z3A08 8 weeks gestation of pregnancy: Secondary | ICD-10-CM

## 2016-06-19 DIAGNOSIS — O219 Vomiting of pregnancy, unspecified: Secondary | ICD-10-CM | POA: Insufficient documentation

## 2016-06-19 DIAGNOSIS — R519 Headache, unspecified: Secondary | ICD-10-CM

## 2016-06-19 DIAGNOSIS — O26891 Other specified pregnancy related conditions, first trimester: Secondary | ICD-10-CM | POA: Insufficient documentation

## 2016-06-19 DIAGNOSIS — G479 Sleep disorder, unspecified: Secondary | ICD-10-CM

## 2016-06-19 LAB — URINALYSIS, ROUTINE W REFLEX MICROSCOPIC
Bilirubin Urine: NEGATIVE
Glucose, UA: NEGATIVE mg/dL
Hgb urine dipstick: NEGATIVE
Ketones, ur: NEGATIVE mg/dL
Leukocytes, UA: NEGATIVE
Nitrite: NEGATIVE
Protein, ur: NEGATIVE mg/dL
Specific Gravity, Urine: <1.005 — ABNORMAL LOW (ref 1.005–1.030)
pH: 6.5 (ref 5.0–8.0)

## 2016-06-19 LAB — POCT PREGNANCY, URINE: Preg Test, Ur: POSITIVE — AB

## 2016-06-19 MED ORDER — PROMETHAZINE HCL 25 MG PO TABS
25.0000 mg | ORAL_TABLET | Freq: Once | ORAL | Status: DC
  Filled 2016-06-19: qty 1

## 2016-06-19 MED ORDER — PROMETHAZINE HCL 25 MG PO TABS: 12.5000 mg | ORAL_TABLET | Freq: Four times a day (QID) | ORAL | 0 refills | Status: DC | PRN

## 2016-06-19 MED ORDER — ACETAMINOPHEN 500 MG PO TABS
1000.0000 mg | ORAL_TABLET | Freq: Once | ORAL | Status: AC
  Administered 2016-06-19: 1000 mg via ORAL
  Filled 2016-06-19: qty 2

## 2016-06-19 NOTE — MAU Provider Note (Signed)
History     CSN: [id number]  Arrival date and time: 06/19/16 2002   First Provider Initiated Contact with Patient 06/19/16 2033      Chief Complaint  Patient presents with  . Emesis   Robin Nguyen is a 25 y.o. G2P1001 at [redacted]w[redacted]d who presents today with nausea and vomiting. She reports that she is also having trouble sleeping and has a headache. She denies any vaginal bleeding.    Emesis   This is a new problem. The current episode started 1 to 4 weeks ago. The problem occurs less than 2 times per day. The problem has been unchanged. The emesis has an appearance of stomach contents. There has been no fever. Associated symptoms include headaches. Pertinent negatives include no abdominal pain, chills, diarrhea or fever. Risk factors: pregnancy  She has tried nothing for the symptoms.   Past Medical History:  Diagnosis Date  . Depression   . Gestational thrombocytopenia (HCC)   . Headache   . Hepatitis B   . Placenta previa 01/10/2015    Past Surgical History:  Procedure Laterality Date  . CESAREAN SECTION N/A 02/03/2015   Procedure: CESAREAN SECTION;  Surgeon: Myna Hidalgo, DO;  Location: [facility];  Service: Obstetrics;  Laterality: N/A;    Family History  Problem Relation Age of Onset  . Hypertension Mother     Social History  Substance Use Topics  . Smoking status: Never Smoker  . Smokeless tobacco: Never Used  . Alcohol use No    Allergies: No Known Allergies  Prescriptions Prior to Admission  Medication Sig Dispense Refill Last Dose  . diphenhydramine-acetaminophen (TYLENOL PM) 25-500 MG TABS tablet Take 1-2 tablets by mouth at bedtime as needed (For sleep.).   Past Month at Unknown time  . prenatal vitamin w/FE, FA (NATACHEW) 29-1 MG CHEW chewable tablet CHEW ONE TABLET BY MOUTH ONCE DAILY AT 12 NOON 30 each 0 06/19/2016 at Unknown time    Review of Systems  Constitutional: Negative for chills and fever.       Difficulty sleeping.   Gastrointestinal: Positive  for nausea and vomiting. Negative for abdominal pain, constipation and diarrhea.  Neurological: Positive for headaches.   Physical Exam   Blood pressure 108/67, pulse 81, temperature 97.3 F (36.3 C), temperature source Oral, resp. rate 20, height 5\' 3"  (1.6 m), weight 120 lb 4 oz (54.5 kg), not currently breastfeeding.  Physical Exam  Nursing note and vitals reviewed. Constitutional: She is oriented to person, place, and time. She appears well-developed and well-nourished. No distress.  HENT:  Head: Normocephalic.  Cardiovascular: Normal rate.   Respiratory: Effort normal.  GI: Soft. There is no tenderness. There is no rebound.  Neurological: She is alert and oriented to person, place, and time.  Skin: Skin is warm and dry.  Psychiatric: She has a normal mood and affect.    MAU Course  Procedures  MDM Bedside US shows +cardiac activity  2112: Patient reports that her headache is better after having tylenol.  Assessment and Plan   1. Nausea/vomiting in pregnancy   2. Headache in pregnancy, antepartum, first trimester   3. [redacted] weeks gestation of pregnancy   4. Difficulty sleeping    DC home Comfort measures reviewed  1st Trimester precautions  RX: phenergan PRN #30  Return to MAU as needed FU with OB as planned  Follow-up Information    OZAN, JENNIFER, M, DO .   Specialty:  Obstetrics and Gynecology Contact information: [address]  [place] [address] [phone]            Robin Nguyen, Robin Nguyen 06/19/2016, 8:36 PM

## 2016-06-19 NOTE — Discharge Instructions (Signed)

## 2016-06-19 NOTE — MAU Note (Signed)
PT SAYS  SHE WENT  TO DR Charlotta NewtonZAN 'S  OFFICE  IN AUG-    HAD  POSITIVE   UPT.     SAYS BECAME NAUSEA  X1 MTH.   VOMITED  IN AUG-  NONE  TODAY.    H/A STARTED  IN AUG-     HAS H/A  A LOT-   WAS GIVEN  RX- BUT  DID  NOT  PICK UP.      SHE CALLED OFFICE  8-17-   TOLD TO TAKE  TYLENOL PM-    TAKEN LAST    ON 8-25-   DOES  NOT  HELP  TO SLEEP    .    FEELS   LOWER CRAMPS- STARTED  TODAY .    LAST SEX-   YESTERDAY   .

## 2016-06-19 NOTE — MAU Note (Signed)
Pt states she has no idea at all what the first day of her last period is.  Pt states she had an ultrasound in the office that would make her 8 weeks.

## 2016-06-27 IMAGING — CR DG CHEST 1V
1 series · 1 of 1 positions shown · non-contrast
Comparison: None.

CLINICAL DATA: Positive PPD.

EXAM:
CHEST  1 VIEW

[w chest pa]
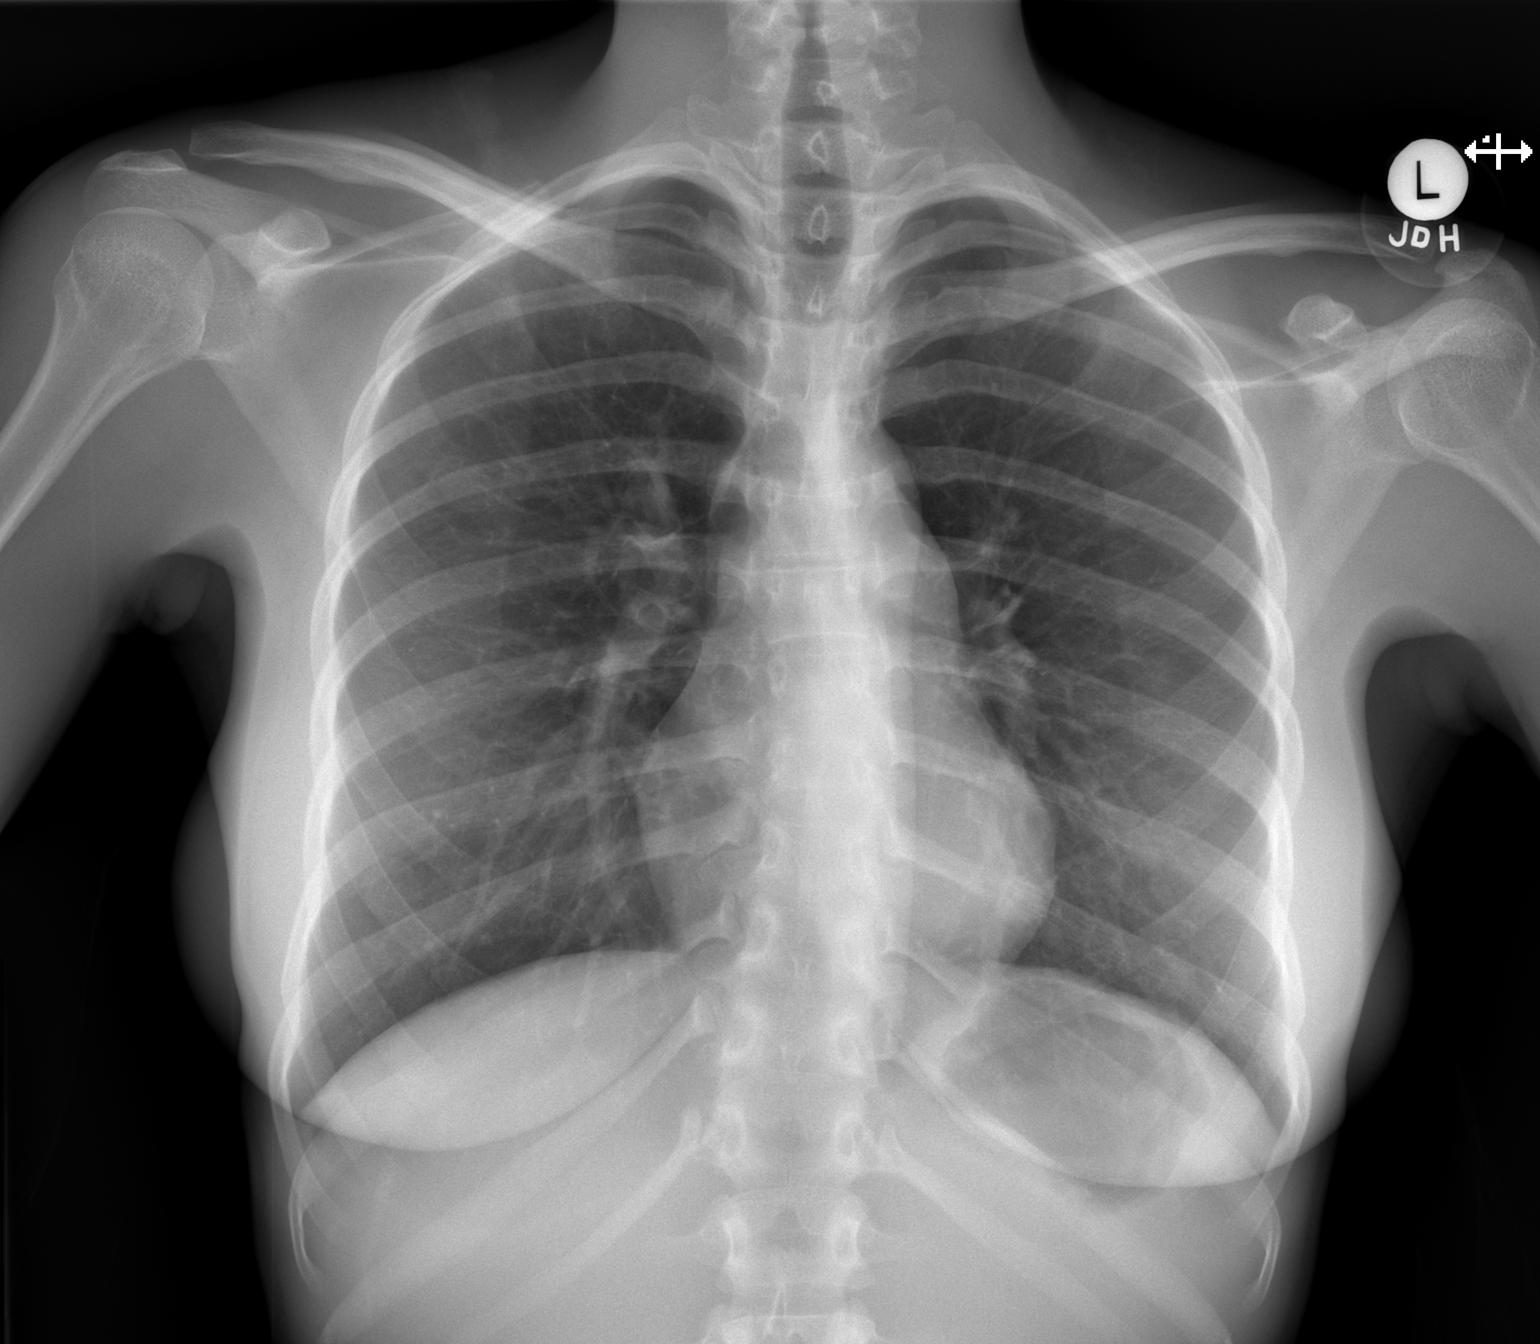

[1 of 1 positions shown; findings below may reference images not displayed]

FINDINGS: The heart size and mediastinal contours are within normal limits.
Both lungs are clear. The visualized skeletal structures are
unremarkable.
IMPRESSION: No active disease.

## 2016-07-10 ENCOUNTER — Encounter (HOSPITAL_COMMUNITY): Payer: Self-pay

## 2016-07-10 DIAGNOSIS — G43909 Migraine, unspecified, not intractable, without status migrainosus: Secondary | ICD-10-CM | POA: Diagnosis not present

## 2016-07-10 DIAGNOSIS — Z5321 Procedure and treatment not carried out due to patient leaving prior to being seen by health care provider: Secondary | ICD-10-CM | POA: Insufficient documentation

## 2016-07-10 NOTE — ED Triage Notes (Signed)
Pt states that around 2pm she got a migraine headache. Pt also reports having fever, but does not know how high. Afebrile now. Pt reports being 3 months pregnant, reports taking tylenol PTA without relief. C/o nausea, denies photophobia.

## 2016-07-11 ENCOUNTER — Emergency Department (HOSPITAL_COMMUNITY)
Admission: EM | Admit: 2016-07-11 | Discharge: 2016-07-11 | Disposition: A | Discharge status: Home / Self Care | Payer: Medicaid Other | Attending: Dermatology | Admitting: Dermatology

## 2016-07-11 NOTE — ED Notes (Signed)
Pt does not respond when name is called for vitals or reassessment.

## 2016-07-23 ENCOUNTER — Inpatient Hospital Stay (HOSPITAL_COMMUNITY)
Admission: AD | Admit: 2016-07-23 | Discharge: 2016-07-23 | Disposition: A | Discharge status: Home / Self Care | Payer: Medicaid Other | Source: Ambulatory Visit | Attending: Obstetrics & Gynecology | Admitting: Obstetrics & Gynecology

## 2016-07-23 ENCOUNTER — Encounter (HOSPITAL_COMMUNITY): Payer: Self-pay | Admitting: Certified Nurse Midwife

## 2016-07-23 DIAGNOSIS — G43009 Migraine without aura, not intractable, without status migrainosus: Secondary | ICD-10-CM | POA: Diagnosis not present

## 2016-07-23 DIAGNOSIS — R109 Unspecified abdominal pain: Secondary | ICD-10-CM | POA: Diagnosis present

## 2016-07-23 DIAGNOSIS — O26891 Other specified pregnancy related conditions, first trimester: Secondary | ICD-10-CM | POA: Diagnosis not present

## 2016-07-23 DIAGNOSIS — O26892 Other specified pregnancy related conditions, second trimester: Secondary | ICD-10-CM | POA: Diagnosis not present

## 2016-07-23 DIAGNOSIS — R519 Headache, unspecified: Secondary | ICD-10-CM

## 2016-07-23 DIAGNOSIS — Z3A13 13 weeks gestation of pregnancy: Secondary | ICD-10-CM | POA: Insufficient documentation

## 2016-07-23 DIAGNOSIS — R51 Headache: Secondary | ICD-10-CM

## 2016-07-23 LAB — URINALYSIS, ROUTINE W REFLEX MICROSCOPIC
Bilirubin Urine: NEGATIVE
Glucose, UA: NEGATIVE mg/dL
Hgb urine dipstick: NEGATIVE
Ketones, ur: NEGATIVE mg/dL
Leukocytes, UA: NEGATIVE
Nitrite: NEGATIVE
Protein, ur: NEGATIVE mg/dL
Specific Gravity, Urine: 1.01 (ref 1.005–1.030)
pH: 7 (ref 5.0–8.0)

## 2016-07-23 MED ORDER — LACTATED RINGERS IV BOLUS (SEPSIS)
1000.0000 mL | Freq: Once | INTRAVENOUS | Status: AC
  Administered 2016-07-23: 1000 mL via INTRAVENOUS

## 2016-07-23 MED ORDER — METOCLOPRAMIDE HCL 10 MG PO TABS: 10.0000 mg | ORAL_TABLET | Freq: Four times a day (QID) | ORAL | 0 refills | Status: DC | PRN

## 2016-07-23 MED ORDER — METOCLOPRAMIDE HCL 5 MG/ML IJ SOLN
10.0000 mg | Freq: Once | INTRAMUSCULAR | Status: AC
  Administered 2016-07-23: 10 mg via INTRAVENOUS
  Filled 2016-07-23: qty 2

## 2016-07-23 MED ORDER — DEXAMETHASONE SODIUM PHOSPHATE 10 MG/ML IJ SOLN
10.0000 mg | Freq: Once | INTRAMUSCULAR | Status: AC
  Administered 2016-07-23: 10 mg via INTRAVENOUS
  Filled 2016-07-23: qty 1

## 2016-07-23 NOTE — MAU Provider Note (Signed)
Chief Complaint: Headache and Abdominal Pain   First Provider Initiated Contact with Patient 07/23/16 1911      SUBJECTIVE HPI: Robin Nguyen is a 25 y.o. G2P1001 at [redacted]w[redacted]d by LMP pt of [facility] who presents to maternity admissions reporting h/a off and on since early pregnancy, most recent episode starting 1 week ago without resolution. She reports the h/a is frontal, constant, and waxes and wanes in intensity. She has tried various medications but none have helped. She reports she is drinking lots of water every day.  The h/a is not worsened or improved by anything. She does have hx of chronic migraines and had evaluation by neurology in the past.  In her first pregnancy, she had injections by a neurologist that improved her headaches but she was told Medicaid will not cover them.  She reports one episode of sharp abdominal pain 2 days ago and one yesterday but denies pain today. She denies constipation or diarrhea. She denies vaginal bleeding, vaginal itching/burning, urinary symptoms, h/a, dizziness, n/v, or fever/chills.     Headache   This is a recurrent problem. The current episode started in the past 7 days. The problem occurs constantly. The problem has been waxing and waning. The pain is located in the frontal region. The pain does not radiate. The pain quality is similar to prior headaches. The quality of the pain is described as aching, band-like and squeezing. The pain is severe. Associated symptoms include photophobia. Pertinent negatives include no blurred vision, dizziness, fever, nausea, tinnitus, visual change or vomiting. The symptoms are aggravated by bright light (pregnancy). She has tried darkened room and acetaminophen (Fioricet ) for the symptoms. The treatment provided no relief. Her past medical history is significant for migraine headaches.    Past Medical History:  Diagnosis Date  . Depression   . Gestational thrombocytopenia (HCC)   . Headache   . Hepatitis B    . Placenta previa 01/10/2015   Past Surgical History:  Procedure Laterality Date  . CESAREAN SECTION N/A 02/03/2015   Procedure: CESAREAN SECTION;  Surgeon: Myna Hidalgo, DO;  Location: [facility];  Service: Obstetrics;  Laterality: N/A;   Social History   Social History  . Marital status: Single    Spouse name: N/A  . Number of children: N/A  . Years of education: N/A   Occupational History  . Not on file.   Social History Main Topics  . Smoking status: Never Smoker  . Smokeless tobacco: Never Used  . Alcohol use No  . Drug use: No  . Sexual activity: Yes    Partners: Male    Birth control/ protection: None   Other Topics Concern  . Not on file   Social History Narrative  . No narrative on file   No current facility-administered medications on file prior to encounter.    Current Outpatient Prescriptions on File Prior to Encounter  Medication Sig Dispense Refill  . diphenhydramine-acetaminophen (TYLENOL PM) 25-500 MG TABS tablet Take 1-2 tablets by mouth at bedtime as needed (For sleep.).    Marland Kitchen prenatal vitamin w/FE, FA (NATACHEW) 29-1 MG CHEW chewable tablet CHEW ONE TABLET BY MOUTH ONCE DAILY AT 12 NOON 30 each 0  . promethazine (PHENERGAN) 25 MG tablet Take 0.5-1 tablets (12.5-25 mg total) by mouth every 6 (six) hours as needed. 30 tablet 0   No Known Allergies  ROS:  Review of Systems  Constitutional: Negative for chills, fatigue and fever.  HENT: Negative for tinnitus.   Eyes: Positive  for photophobia. Negative for blurred vision.  Respiratory: Negative for shortness of breath.   Cardiovascular: Negative for chest pain.  Gastrointestinal: Negative for nausea and vomiting.  Genitourinary: Negative for difficulty urinating, dysuria, flank pain, pelvic pain, vaginal bleeding, vaginal discharge and vaginal pain.  Neurological: Positive for headaches. Negative for dizziness.  Psychiatric/Behavioral: Negative.      I have reviewed patient's Past Medical Hx,  Surgical Hx, Family Hx, Social Hx, medications and allergies.   Physical Exam   Patient Vitals for the past 24 hrs:  BP Temp Temp src Pulse Resp  07/23/16 1855 113/69 98.3 F (36.8 C) Oral 73 18   Constitutional: Well-developed, well-nourished female in no acute distress.  Cardiovascular: normal rate Respiratory: normal effort GI: Abd soft, non-tender. Pos BS x 4 MS: Extremities nontender, no edema, normal ROM Neurologic: Alert and oriented x 4.  GU: Neg CVAT.  FHT 153 by doppler  LAB RESULTS Results for orders placed or performed during the hospital encounter of 07/23/16 (from the past 24 hour(s))  Urinalysis, Routine w reflex microscopic (not at [facility])     Status: None   Collection Time: 07/23/16  6:49 PM  Result Value Ref Range   Color, Urine YELLOW YELLOW   APPearance CLEAR CLEAR   Specific Gravity, Urine 1.010 1.005 - 1.030   pH 7.0 5.0 - 8.0   Glucose, UA NEGATIVE NEGATIVE mg/dL   Hgb urine dipstick NEGATIVE NEGATIVE   Bilirubin Urine NEGATIVE NEGATIVE   Ketones, ur NEGATIVE NEGATIVE mg/dL   Protein, ur NEGATIVE NEGATIVE mg/dL   Nitrite NEGATIVE NEGATIVE   Leukocytes, UA NEGATIVE NEGATIVE       IMAGING No results found.  MAU Management/MDM:  I have ordered labs and reviewed results.  Fetal heart rate is normal with no abdominal pain today Consult Ozan with presentation, exam findings and test results.  Treatments in MAU included IV headache cocktail including LR x 1000 ml, Reglan 10 mg IV, and Decadron 10 mg IV.  Benadryl is usually given in this combination but was held because pt is driving.  Pt plans to take PO Benadryl when she gets home.   Pt had complete relief of headache after medications/fluids given. Pt stable at time of discharge.  ASSESSMENT 1. Pregnancy headache in second trimester   2. Migraine without aura and without status migrainosus, not intractable     PLAN Discharge home Rx for Reglan 10 mg PO Q 6 hours [facility] to contact pt  about outpatient referral to Neurology    Medication List    TAKE these medications   diphenhydramine-acetaminophen 25-500 MG Tabs tablet Commonly known as:  TYLENOL PM Take 1-2 tablets by mouth at bedtime as needed (For sleep.).   metoCLOPramide 10 MG tablet Commonly known as:  REGLAN Take 1 tablet (10 mg total) by mouth every 6 (six) hours as needed for nausea.   prenatal vitamin w/FE, FA 29-1 MG Chew chewable tablet CHEW ONE TABLET BY MOUTH ONCE DAILY AT 12 NOON   promethazine 25 MG tablet Commonly known as:  PHENERGAN Take 0.5-1 tablets (12.5-25 mg total) by mouth every 6 (six) hours as needed.      Follow-up Information    Myna HidalgoOZAN, JENNIFER, M, DO .   Specialty:  Obstetrics and Gynecology Why:  The office will call you with referral to neurology.  Keep your scheduled OB appointments. Return to MAU as needed for emergencies. Contact information: [address] [place] [address] [phone]  Sharen Counter Certified Nurse-Midwife 07/23/2016  8:06 PM

## 2016-07-23 NOTE — MAU Note (Signed)
Pt states she has a HA that has lasted over 1 week and she is taking medication for it and nothing is working. Pt states HA is 10/10. Pt does not remember the name of the medication she took for the HA but last took it on Friday. Pt states that twice since Friday she has felt a sharp shooting pain in her abdomen. Pt denies cramping or bleeding.

## 2016-07-23 NOTE — Discharge Instructions (Signed)

## 2016-08-09 ENCOUNTER — Ambulatory Visit (INDEPENDENT_AMBULATORY_CARE_PROVIDER_SITE_OTHER): Payer: Medicaid Other | Admitting: Neurology

## 2016-08-09 ENCOUNTER — Encounter: Payer: Self-pay | Admitting: Neurology

## 2016-08-09 DIAGNOSIS — G43709 Chronic migraine without aura, not intractable, without status migrainosus: Secondary | ICD-10-CM

## 2016-08-09 DIAGNOSIS — IMO0002 Reserved for concepts with insufficient information to code with codable children: Secondary | ICD-10-CM

## 2016-08-09 MED ORDER — PROPRANOLOL HCL 20 MG PO TABS: 20.0000 mg | ORAL_TABLET | Freq: Two times a day (BID) | ORAL | 11 refills | Status: DC

## 2016-08-09 MED ORDER — MAGNESIUM OXIDE -MG SUPPLEMENT 400 (240 MG) MG PO TABS: 400.0000 mg | ORAL_TABLET | Freq: Two times a day (BID) | ORAL | 4 refills | Status: DC

## 2016-08-09 MED ORDER — RIBOFLAVIN 100 MG PO TABS: 100.0000 mg | ORAL_TABLET | Freq: Two times a day (BID) | ORAL | 4 refills | Status: DC

## 2016-08-09 MED ORDER — BUTALBITAL-APAP-CAFF-COD 50-325-40-30 MG PO CAPS: 1.0000 | ORAL_CAPSULE | ORAL | 0 refills | Status: DC | PRN

## 2016-08-09 MED ORDER — FOLIC ACID 1 MG PO TABS: 1.0000 mg | ORAL_TABLET | Freq: Every day | ORAL | 4 refills | Status: DC

## 2016-08-09 NOTE — Progress Notes (Signed)
PATIENT: Robin Nguyen DOB: [birth year]  Chief Complaint  Patient presents with  . Migraine    She has been having daily headaches since being preganant.  Her expected due date is 01/24/17.  Reports a history of migraines and says she has been treated in the past at the [facility] by Dr. Neale Burly.  She thinks she may have had Botox injections in the past.  . PCP/OB-GYN    Myna Hidalgo, DO     HISTORICAL  Robin Nguyen is a 25 years old female, seen in refer by her primary care doctor Myna Hidalgo for evaluation of frequent headaches, initial evaluation was on August 09 2016.  She is currently 4 months pregnant, she reported onset of headache during her first pregnancy in 2015, she experience frequent severe headache during her first pregnancy, her daughter is now 36 year old, in between pregnancy, she denies significant headaches, she is now again to experience almost constant daily holocranial headaches again, 9 out of 10, constant pressure, she denies visual loss no lateralized motor or sensory deficit. Oftentimes her headache exacerbated to 10 out of 10 most severe headaches, with nausea, some light noise sensitivity, she has to seeking urgent care or emergency room treatment.  I reviewed laboratory evaluation, negative UA, normal BMP, CBC,  REVIEW OF SYSTEMS: Full 14 system review of systems performed and notable only for as above   ALLERGIES: No Known Allergies  HOME MEDICATIONS: Current Outpatient Prescriptions  Medication Sig Dispense Refill  . diphenhydramine-acetaminophen (TYLENOL PM) 25-500 MG TABS tablet Take 1-2 tablets by mouth at bedtime as needed (For sleep.).    Marland Kitchen prenatal vitamin w/FE, FA (NATACHEW) 29-1 MG CHEW chewable tablet CHEW ONE TABLET BY MOUTH ONCE DAILY AT 12 NOON 30 each 0  . promethazine (PHENERGAN) 25 MG tablet Take 0.5-1 tablets (12.5-25 mg total) by mouth every 6 (six) hours as needed. 30 tablet 0   No current facility-administered  medications for this visit.     PAST MEDICAL HISTORY: Past Medical History:  Diagnosis Date  . Depression   . Gestational thrombocytopenia (HCC)   . Headache   . Hepatitis B   . Migraine   . Placenta previa 01/10/2015    PAST SURGICAL HISTORY: Past Surgical History:  Procedure Laterality Date  . CESAREAN SECTION N/A 02/03/2015   Procedure: CESAREAN SECTION;  Surgeon: Myna Hidalgo, DO;  Location: [facility];  Service: Obstetrics;  Laterality: N/A;    FAMILY HISTORY: Family History  Problem Relation Age of Onset  . Hypertension Mother   . Healthy Father     SOCIAL HISTORY:  Social History   Social History  . Marital status: Single    Spouse name: N/A  . Number of children: 1  . Years of education: HS   Occupational History  . 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    Social History Main Topics  . Smoking status: Never Smoker  . Smokeless tobacco: Never Used  . Alcohol use No  . Drug use: No  . Sexual activity: Yes    Partners: Male    Birth control/ protection: None   Other Topics Concern  . Not on file   Social History Narrative   Right-handed.   No caffeine use.   Lives at home with her daughter.     PHYSICAL EXAM   Vitals:   08/09/16 0744  BP: (!) 91/55  Pulse: 71  Weight: 118 lb (53.5 kg)  Height: 5\' 5"  (1.651 m)    Not  recorded      Body mass index is 19.64 kg/m.  PHYSICAL EXAMNIATION:  Gen: NAD, conversant, well nourised, obese, well groomed                     Cardiovascular: Regular rate rhythm, no peripheral edema, warm, nontender. Eyes: Conjunctivae clear without exudates or hemorrhage Neck: Supple, no carotid bruits. Pulmonary: Clear to auscultation bilaterally   NEUROLOGICAL EXAM:  MENTAL STATUS: Speech:    Speech is normal; fluent and spontaneous with normal comprehension.  Cognition:     Orientation to time, place and person     Normal recent and remote memory     Normal Attention span and concentration     Normal Language,  naming, repeating,spontaneous speech     Fund of knowledge   CRANIAL NERVES: CN II: Visual fields are full to confrontation. Fundoscopic exam is normal with sharp discs and no vascular changes. Pupils are round equal and briskly reactive to light. CN III, IV, VI: extraocular movement are normal. No ptosis. CN V: Facial sensation is intact to pinprick in all 3 divisions bilaterally. Corneal responses are intact.  CN VII: Face is symmetric with normal eye closure and smile. CN VIII: Hearing is normal to rubbing fingers CN IX, X: Palate elevates symmetrically. Phonation is normal. CN XI: Head turning and shoulder shrug are intact CN XII: Tongue is midline with normal movements and no atrophy.  MOTOR: There is no pronator drift of out-stretched arms. Muscle bulk and tone are normal. Muscle strength is normal.  REFLEXES: Reflexes are 2+ and symmetric at the biceps, triceps, knees, and ankles. Plantar responses are flexor.  SENSORY: Intact to light touch, pinprick, positional sensation and vibratory sensation are intact in fingers and toes.  COORDINATION: Rapid alternating movements and fine finger movements are intact. There is no dysmetria on finger-to-nose and heel-knee-shin.    GAIT/STANCE: Posture is normal. Gait is steady with normal steps, base, arm swing, and turning. Heel and toe walking are normal. Tandem gait is normal.  Romberg is absent.   DIAGNOSTIC DATA (LABS, IMAGING, TESTING) - I reviewed patient records, labs, notes, testing and imaging myself where available.   ASSESSMENT AND PLAN  Wyatt HasteJeneba Knight is a 25 y.o. female   Chronic headaches with migraine features Currently 4 months pregnant   I have add on preventive medications magnesium oxide 400 mg, riboflavin 100 mg twice a day, propanolol 20 mg twice a day  Fioricet 1 tablet as needed  Advised her continue folic acid 1 mg daily, and pregnancy vitamins  Return to clinic in 2-3 months   Levert FeinsteinYijun Dellanira Dillow, M.D.  Ph.D.  [facility] [address], [address] [place], [place] [address] Ph: [phone] Fax: [phone]  CC: Myna HidalgoJennifer Ozan, DO

## 2016-08-13 ENCOUNTER — Ambulatory Visit (HOSPITAL_COMMUNITY)
Admission: EM | Admit: 2016-08-13 | Discharge: 2016-08-13 | Disposition: A | Discharge status: Nursing Home (Includes ICF) | Payer: Medicaid Other

## 2016-08-13 NOTE — ED Triage Notes (Signed)
[facility] with a complaint of a headache and sharp abdominal pains. The patient reported to be 45 months pregnant. Hayden Rasmussenavid Mabe, NP was consulted and he advised that the patient needs to proceed to [facility].

## 2016-11-06 ENCOUNTER — Inpatient Hospital Stay (HOSPITAL_COMMUNITY)
Admission: AD | Admit: 2016-11-06 | Discharge: 2016-11-07 | Disposition: A | Discharge status: Home / Self Care | Payer: Medicaid Other | Source: Ambulatory Visit | Attending: Obstetrics and Gynecology | Admitting: Obstetrics and Gynecology

## 2016-11-06 ENCOUNTER — Encounter (HOSPITAL_COMMUNITY): Payer: Self-pay | Admitting: *Deleted

## 2016-11-06 DIAGNOSIS — O99343 Other mental disorders complicating pregnancy, third trimester: Secondary | ICD-10-CM | POA: Diagnosis not present

## 2016-11-06 DIAGNOSIS — M5432 Sciatica, left side: Secondary | ICD-10-CM

## 2016-11-06 DIAGNOSIS — O99113 Other diseases of the blood and blood-forming organs and certain disorders involving the immune mechanism complicating pregnancy, third trimester: Secondary | ICD-10-CM | POA: Insufficient documentation

## 2016-11-06 DIAGNOSIS — D6959 Other secondary thrombocytopenia: Secondary | ICD-10-CM | POA: Insufficient documentation

## 2016-11-06 DIAGNOSIS — Z79899 Other long term (current) drug therapy: Secondary | ICD-10-CM | POA: Diagnosis not present

## 2016-11-06 DIAGNOSIS — F329 Major depressive disorder, single episode, unspecified: Secondary | ICD-10-CM | POA: Diagnosis not present

## 2016-11-06 DIAGNOSIS — O26893 Other specified pregnancy related conditions, third trimester: Secondary | ICD-10-CM | POA: Insufficient documentation

## 2016-11-06 DIAGNOSIS — Z3A28 28 weeks gestation of pregnancy: Secondary | ICD-10-CM | POA: Diagnosis not present

## 2016-11-06 DIAGNOSIS — M25552 Pain in left hip: Secondary | ICD-10-CM | POA: Diagnosis present

## 2016-11-06 NOTE — MAU Note (Signed)
PT  SAYS   SHE  STARTED  FEELING  PAIN IN LEFT HIP  IN DEC-  GETTING  WORSE   WITH ALL POSITIONS  AND  ACTIVITY.    PNC  WITH     DR  Charlotta NewtonZAN - TOLD  DR- AND  SAYS IF WORSE TELL HER.    PT  SAYS YESTERDAY  HURT REALLY BAD  TO WALK.

## 2016-11-07 DIAGNOSIS — M5432 Sciatica, left side: Secondary | ICD-10-CM

## 2016-11-07 LAB — URINALYSIS, ROUTINE W REFLEX MICROSCOPIC
Bilirubin Urine: NEGATIVE
Glucose, UA: NEGATIVE mg/dL
Hgb urine dipstick: NEGATIVE
Ketones, ur: NEGATIVE mg/dL
Leukocytes, UA: NEGATIVE
Nitrite: NEGATIVE
Protein, ur: NEGATIVE mg/dL
Specific Gravity, Urine: 1.006 (ref 1.005–1.030)
pH: 7 (ref 5.0–8.0)

## 2016-11-07 MED ORDER — ACETAMINOPHEN 500 MG PO TABS
1000.0000 mg | ORAL_TABLET | Freq: Once | ORAL | Status: AC
  Administered 2016-11-07: 1000 mg via ORAL
  Filled 2016-11-07: qty 2

## 2016-11-07 NOTE — Discharge Instructions (Signed)
Back Pain in Pregnancy Introduction Back pain during pregnancy is common. Back pain may be caused by several factors that are related to changes during your pregnancy. Follow these instructions at home: Managing pain, stiffness, and swelling  If directed, apply ice for sudden (acute) back pain.  Put ice in a plastic bag.  Place a towel between your skin and the bag.  Leave the ice on for 20 minutes, 2-3 times per day.  If directed, apply heat to the affected area before you exercise:  Place a towel between your skin and the heat pack or heating pad.  Leave the heat on for 20-30 minutes.  Remove the heat if your skin turns bright red. This is especially important if you are unable to feel pain, heat, or cold. You may have a greater risk of getting burned. Activity  Exercise as told by your health care provider. Exercising is the best way to prevent or manage back pain.  Listen to your body when lifting. If lifting hurts, ask for help or bend your knees. This uses your leg muscles instead of your back muscles.  Squat down when picking up something from the floor. Do not bend over.  Only use bed rest as told by your health care provider. Bed rest should only be used for the most severe episodes of back pain. Standing, Sitting, and Lying Down  Do not stand in one place for long periods of time.  Use good posture when sitting. Make sure your head rests over your shoulders and is not hanging forward. Use a pillow on your lower back if necessary.  Try sleeping on your side, preferably the left side, with a pillow or two between your legs. If you are sore after a night's rest, your bed may be too soft. A firm mattress may provide more support for your back during pregnancy. General instructions  Do not wear high heels.  Eat a healthy diet. Try to gain weight within your health care provider's recommendations.  Use a maternity girdle, elastic sling, or back brace as told by your  health care provider.  Take over-the-counter and prescription medicines only as told by your health care provider.  Keep all follow-up visits as told by your health care provider. This is important. This includes any visits with any specialists, such as a physical therapist. Contact a health care provider if:  Your back pain interferes with your daily activities.  You have increasing pain in other parts of your body. Get help right away if:  You develop numbness, tingling, weakness, or problems with the use of your arms or legs.  You develop severe back pain that is not controlled with medicine.  You have a sudden change in bowel or bladder control.  You develop shortness of breath, dizziness, or you faint.  You develop nausea, vomiting, or sweating.  You have back pain that is a rhythmic, cramping pain similar to labor pains. Labor pain is usually 1-2 minutes apart, lasts for about 1 minute, and involves a bearing down feeling or pressure in your pelvis.  You have back pain and your water breaks or you have vaginal bleeding.  You have back pain or numbness that travels down your leg.  Your back pain developed after you fell.  You develop pain on one side of your b  Back Pain, Adult Back pain is very common in adults.The cause of back pain is rarely dangerous and the pain often gets better over time.The cause of your  back pain may not be known. Some common causes of back pain include: Strain of the muscles or ligaments supporting the spine. Wear and tear (degeneration) of the spinal disks. Arthritis. Direct injury to the back. For many people, back pain may return. Since back pain is rarely dangerous, most people can learn to manage this condition on their own. Follow these instructions at home: Watch your back pain for any changes. The following actions may help to lessen any discomfort you are feeling: Remain active. It is stressful on your back to sit or stand in one  place for long periods of time. Do not sit, drive, or stand in one place for more than 30 minutes at a time. Take short walks on even surfaces as soon as you are able.Try to increase the length of time you walk each day. Exercise regularly as directed by your health care provider. Exercise helps your back heal faster. It also helps avoid future injury by keeping your muscles strong and flexible. Do not stay in bed.Resting more than 1-2 days can delay your recovery. Pay attention to your body when you bend and lift. The most comfortable positions are those that put less stress on your recovering back. Always use proper lifting techniques, including: Bending your knees. Keeping the load close to your body. Avoiding twisting. Find a comfortable position to sleep. Use a firm mattress and lie on your side with your knees slightly bent. If you lie on your back, put a pillow under your knees. Avoid feeling anxious or stressed.Stress increases muscle tension and can worsen back pain.It is important to recognize when you are anxious or stressed and learn ways to manage it, such as with exercise. Take medicines only as directed by your health care provider. Over-the-counter medicines to reduce pain and inflammation are often the most helpful.Your health care provider may prescribe muscle relaxant drugs.These medicines help dull your pain so you can more quickly return to your normal activities and healthy exercise. Apply ice to the injured area: Put ice in a plastic bag. Place a towel between your skin and the bag. Leave the ice on for 20 minutes, 2-3 times a day for the first 2-3 days. After that, ice and heat may be alternated to reduce pain and spasms. Maintain a healthy weight. Excess weight puts extra stress on your back and makes it difficult to maintain good posture. Contact a health care provider if: You have pain that is not relieved with rest or medicine. You have increasing pain going down  into the legs or buttocks. You have pain that does not improve in one week. You have night pain. You lose weight. You have a fever or chills. Get help right away if: You develop new bowel or bladder control problems. You have unusual weakness or numbness in your arms or legs. You develop nausea or vomiting. You develop abdominal pain. You feel faint. This information is not intended to replace advice given to you by your health care provider. Make sure you discuss any questions you have with your health care provider. Document Released: 10/02/2005 Document Revised: 02/10/2016 Document Reviewed: 02/03/2014 Elsevier Interactive Patient Education  2017 [employer].  ack.  You see blood in your urine.  You develop skin blisters in the area of your back pain. This information is not intended to replace advice given to you by your health care provider. Make sure you discuss any questions you have with your health care provider. Document Released: 01/10/2006 Document Revised: 03/09/2016  Document Reviewed: 06/16/2015  2017 Elsevier

## 2016-11-07 NOTE — MAU Provider Note (Signed)
History     CSN: [id number]  Arrival date and time: 11/06/16 2324   First Provider Initiated Contact with Patient 11/07/16 0007      Chief Complaint  Patient presents with  . Hip Pain   Robin Nguyen is a 26 y.o. G2P1001 at 6268w6d who presents today with left hip pain. She states that she has had the pain off and on for about one month, but it has gotten worse in the last day. She told her OB, and was told to tell them if the pain worsened, and they would set up PT. She has not informed her OB that the pain has worsened. She denies any VB, LOF or contractions. She reports normal fetal movement. Next appointment 11/17/16. She denies any complications with this pregnancy at this time. She does reports hx of Hep B.    Hip Pain   The incident occurred more than 1 week ago. There was no injury mechanism. The pain is present in the left hip and left leg. The quality of the pain is described as shooting. The pain is at a severity of 9/10. The pain has been intermittent since onset. Pertinent negatives include no numbness or tingling. The symptoms are aggravated by movement. She has tried nothing for the symptoms.   Past Medical History:  Diagnosis Date  . Depression   . Gestational thrombocytopenia (HCC)   . Headache   . Hepatitis B   . Migraine   . Placenta previa 01/10/2015    Past Surgical History:  Procedure Laterality Date  . CESAREAN SECTION N/A 02/03/2015   Procedure: CESAREAN SECTION;  Surgeon: Myna HidalgoJennifer Ozan, DO;  Location: [facility];  Service: Obstetrics;  Laterality: N/A;    Family History  Problem Relation Age of Onset  . Hypertension Mother   . Healthy Father     Social History  Substance Use Topics  . Smoking status: Never Smoker  . Smokeless tobacco: Never Used  . Alcohol use No    Allergies: No Known Allergies  Prescriptions Prior to Admission  Medication Sig Dispense Refill Last Dose  . prenatal vitamin w/FE, FA (NATACHEW) 29-1 MG CHEW chewable tablet CHEW ONE  TABLET BY MOUTH ONCE DAILY AT 12 NOON 30 each 0 11/06/2016 at Unknown time  . butalbital-acetaminophen-caffeine (FIORICET WITH CODEINE) 50-325-40-30 MG capsule Take 1 capsule by mouth every 4 (four) hours as needed for headache. Only refill her Rx every 30 days 10 capsule 0 More than a month at Unknown time  . diphenhydramine-acetaminophen (TYLENOL PM) 25-500 MG TABS tablet Take 1-2 tablets by mouth at bedtime as needed (For sleep.).   More than a month at Unknown time  . folic acid (FOLVITE) 1 MG tablet Take 1 tablet (1 mg total) by mouth daily. 90 tablet 4 Unknown at Unknown time  . Magnesium Oxide 400 (240 Mg) MG TABS Take 400 mg by mouth 2 (two) times daily. 180 tablet 4 More than a month at Unknown time  . promethazine (PHENERGAN) 25 MG tablet Take 0.5-1 tablets (12.5-25 mg total) by mouth every 6 (six) hours as needed. 30 tablet 0 More than a month at Unknown time  . propranolol (INDERAL) 20 MG tablet Take 1 tablet (20 mg total) by mouth 2 (two) times daily. 60 tablet 11 More than a month at Unknown time  . Riboflavin 100 MG TABS Take 1 tablet (100 mg total) by mouth 2 (two) times daily. 180 tablet 4 More than a month at Unknown time    Review of  Systems  Constitutional: Negative for chills and fever.  Gastrointestinal: Negative for abdominal pain, constipation, diarrhea and vomiting.  Genitourinary: Negative for dysuria, vaginal bleeding and vaginal discharge.  Neurological: Negative for tingling and numbness.   Physical Exam   Blood pressure 102/60, pulse 66, temperature 98.2 F (36.8 C), temperature source Oral, resp. rate 18, height 5\' 3"  (1.6 m), weight 130 lb 4 oz (59.1 kg), not currently breastfeeding.  Physical Exam  Nursing note and vitals reviewed. Constitutional: She is oriented to person, place, and time. She appears well-developed and well-nourished. No distress.  HENT:  Head: Normocephalic.  Cardiovascular: Normal rate.   Respiratory: Effort normal.  GI: Soft. There is  no tenderness. There is no rebound.  Genitourinary:  Genitourinary Comments: Cervix: closed/thick   Neurological: She is alert and oriented to person, place, and time.  Skin: Skin is warm and dry.  Psychiatric: She has a normal mood and affect.   FHT: 150, moderate with 15x15 accels, no decels Toco: no UCs  MAU Course  Procedures  MDM Patient has had tylenol and hot pack. She reports that her pain is better.   Assessment and Plan   1. Sciatica of left side   2. [redacted] weeks gestation of pregnancy    DC home Comfort measures reviewed  3rd Trimester precautions  PTL precautions  Fetal kick counts RX: none  Return to MAU as needed FU with OB as planned  Follow-up Information    OZAN, JENNIFER, M, DO Follow up.   Specialty:  Obstetrics and Gynecology Contact information: [address] [place] [address] [phone]            Tawnya Crook 11/07/2016, 12:08 AM

## 2016-11-09 ENCOUNTER — Ambulatory Visit (INDEPENDENT_AMBULATORY_CARE_PROVIDER_SITE_OTHER): Payer: Medicaid Other | Admitting: Nurse Practitioner

## 2016-11-09 ENCOUNTER — Encounter: Payer: Self-pay | Admitting: Nurse Practitioner

## 2016-11-09 VITALS — BP 103/64 | HR 64 | Ht 63.0 in | Wt 130.6 lb

## 2016-11-09 DIAGNOSIS — G43709 Chronic migraine without aura, not intractable, without status migrainosus: Secondary | ICD-10-CM

## 2016-11-09 DIAGNOSIS — IMO0002 Reserved for concepts with insufficient information to code with codable children: Secondary | ICD-10-CM

## 2016-11-09 NOTE — Progress Notes (Addendum)
[facility]  PATIENT: Robin Nguyen DOB: 1991-06-15   REASON FOR VISIT: Follow-up for migraine HISTORY FROM: Patient    HISTORY OF PRESENT ILLNESS:UPDATE 01/25/2018CM Robin Nguyen, 26 year old female returns for follow-up with history of headaches. When last seen by Dr. Terrace Arabia she was advised to take Inderal 20 mg twice daily and riboflavin and magnesium. She took the Inderal for approximately a month and her headaches went away so she stopped the medication. She has not had to take the Fioricet acutely. She returns today for revisit. She is currently pregnant and due date 01/17/2017. She has had an uneventful pregnancy so far. She had headaches with her first pregnancy.  HISTORY 08/09/16 Robin Nguyen is a 26 years old female, seen in refer by her primary care doctor Myna Hidalgo for evaluation of frequent headaches, initial evaluation was on August 09 2016.  She is currently 4 months pregnant, she reported onset of headache during her first pregnancy in 2015, she experience frequent severe headache during her first pregnancy, her daughter is now 63 year old, in between pregnancy, she denies significant headaches, she is now again to experience almost constant daily holocranial headaches again, 9 out of 10, constant pressure, she denies visual loss no lateralized motor or sensory deficit. Oftentimes her headache exacerbated to 10 out of 10 most severe headaches, with nausea, some light noise sensitivity, she has to seeking urgent care or emergency room treatment.  I reviewed laboratory evaluation, negative UA, normal BMP, CBC,   REVIEW OF SYSTEMS: Full 14 system review of systems performed and notable only for those listed, all others are neg:  Constitutional: neg  Cardiovascular: neg Ear/Nose/Throat: neg  Skin: neg Eyes: neg Respiratory: neg Gastroitestinal: neg  Hematology/Lymphatic: neg  Endocrine: neg Musculoskeletal:neg Allergy/Immunology:  neg Neurological: neg Psychiatric: neg Sleep : neg   ALLERGIES: No Known Allergies  HOME MEDICATIONS: Outpatient Medications Prior to Visit  Medication Sig Dispense Refill  . prenatal vitamin w/FE, FA (NATACHEW) 29-1 MG CHEW chewable tablet CHEW ONE TABLET BY MOUTH ONCE DAILY AT 12 NOON 30 each 0  . butalbital-acetaminophen-caffeine (FIORICET WITH CODEINE) 50-325-40-30 MG capsule Take 1 capsule by mouth every 4 (four) hours as needed for headache. Only refill her Rx every 30 days (Patient not taking: Reported on 11/09/2016) 10 capsule 0  . diphenhydramine-acetaminophen (TYLENOL PM) 25-500 MG TABS tablet Take 1-2 tablets by mouth at bedtime as needed (For sleep.).    Marland Kitchen folic acid (FOLVITE) 1 MG tablet Take 1 tablet (1 mg total) by mouth daily. (Patient not taking: Reported on 11/09/2016) 90 tablet 4  . Magnesium Oxide 400 (240 Mg) MG TABS Take 400 mg by mouth 2 (two) times daily. (Patient not taking: Reported on 11/09/2016) 180 tablet 4  . promethazine (PHENERGAN) 25 MG tablet Take 0.5-1 tablets (12.5-25 mg total) by mouth every 6 (six) hours as needed. (Patient not taking: Reported on 11/09/2016) 30 tablet 0  . propranolol (INDERAL) 20 MG tablet Take 1 tablet (20 mg total) by mouth 2 (two) times daily. (Patient not taking: Reported on 11/09/2016) 60 tablet 11  . Riboflavin 100 MG TABS Take 1 tablet (100 mg total) by mouth 2 (two) times daily. (Patient not taking: Reported on 11/09/2016) 180 tablet 4   No facility-administered medications prior to visit.     PAST MEDICAL HISTORY: Past Medical History:  Diagnosis Date  . Depression   . Gestational thrombocytopenia (HCC)   . Headache   . Hepatitis B   . Migraine   . Placenta previa  01/10/2015    PAST SURGICAL HISTORY: Past Surgical History:  Procedure Laterality Date  . CESAREAN SECTION N/A 02/03/2015   Procedure: CESAREAN SECTION;  Surgeon: Myna Hidalgo, DO;  Location: [facility];  Service: Obstetrics;  Laterality: N/A;    FAMILY  HISTORY: Family History  Problem Relation Age of Onset  . Hypertension Mother   . Healthy Father     SOCIAL HISTORY: Social History   Social History  . Marital status: Single    Spouse name: N/A  . Number of children: 1  . Years of education: HS   Occupational History  . 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    Social History Main Topics  . Smoking status: Never Smoker  . Smokeless tobacco: Never Used  . Alcohol use No  . Drug use: No  . Sexual activity: Yes    Partners: Male    Birth control/ protection: None     Comment: last sex several months ago   Other Topics Concern  . Not on file   Social History Narrative   Right-handed.   No caffeine use.   Lives at home with her daughter.     PHYSICAL EXAM  Vitals:   11/09/16 0835  BP: 103/64  Pulse: 64  Weight: 130 lb 9.6 oz (59.2 kg)  Height: 5\' 3"  (1.6 m)   Body mass index is 23.13 kg/m.  Generalized: Well developed,Pregnant female in no acute distress  Head: normocephalic and atraumatic,. Oropharynx benign  Musculoskeletal: No deformity   Neurological examination   Mentation: Alert oriented to time, place, history taking. Attention span and concentration appropriate. Recent and remote memory intact.  Follows all commands speech and language fluent.   Cranial nerve II-XII: .Pupils were equal round reactive to light extraocular movements were full, visual field were full on confrontational test. Facial sensation and strength were normal. hearing was intact to finger rubbing bilaterally. Uvula tongue midline. head turning and shoulder shrug were normal and symmetric.Tongue protrusion into cheek strength was normal. Motor: normal bulk and tone, full strength in the BUE, BLE, fine finger movements normal, no pronator drift. No focal weakness Sensory: normal and symmetric to light touch, in the upper and lower extremities Coordination: finger-nose-finger, heel-to-shin bilaterally, no dysmetria Reflexes: Brachioradialis  2/2, biceps 2/2, triceps 2/2, patellar 2/2, Achilles 2/2, plantar responses were flexor bilaterally. Gait and Station: Rising up from seated position without assistance, normal stance,  moderate stride, good arm swing, smooth turning, able to perform tiptoe, and heel walking without difficulty. Tandem gait is steady  DIAGNOSTIC DATA (LABS, IMAGING, TESTING)   ASSESSMENT AND PLAN  26 y.o. year old female  has a past medical history of Depression; Gestational thrombocytopenia (HCC); Headache; ; Migraine; here to follow-up for her headaches. She took Inderal for one month and her headaches disappeared and she stopped the medication.The patient is a current patient of Dr. Terrace Arabia  who is out of the office today . This note is sent to the work in doctor.     Pt has fioricet to take if headaches occur Discussed food triggers and weather changes Made patient aware that because she is pregnant options for preventative medications are limited, therefore if her headaches return please give [place] a call and we will probably restart her Inderal. No follow up planned I spent 20 min in total face to face time with the patient more than 50% of which was spent counseling and coordination of care,  reviewing medications and discussing and reviewing the diagnosis of migraines and  pregnancy and further treatment options which are limited.  Nilda RiggsNancy Carolyn Mickelle Nguyen, [facility] AntonioGNP, [facility] HospitalBC, APRN  [facility] [address], [address] [place], [place] [id number] [phone]  I reviewed the above note and documentation by the Nurse Practitioner and agree with the history, physical exam, assessment and plan as outlined above. I was immediately available for face-to-face consultation. Huston FoleySaima Athar, MD, PhD [facility])

## 2016-11-09 NOTE — Patient Instructions (Signed)
Pt has fioricet to take if headaches occur No follow up planned

## 2016-11-20 ENCOUNTER — Inpatient Hospital Stay (HOSPITAL_COMMUNITY)
Admission: AD | Admit: 2016-11-20 | Discharge: 2016-11-20 | Disposition: A | Discharge status: Home / Self Care | Payer: Medicaid Other | Source: Ambulatory Visit | Attending: Obstetrics & Gynecology | Admitting: Obstetrics & Gynecology

## 2016-11-20 ENCOUNTER — Encounter (HOSPITAL_COMMUNITY): Payer: Self-pay

## 2016-11-20 DIAGNOSIS — O99113 Other diseases of the blood and blood-forming organs and certain disorders involving the immune mechanism complicating pregnancy, third trimester: Secondary | ICD-10-CM | POA: Insufficient documentation

## 2016-11-20 DIAGNOSIS — N859 Noninflammatory disorder of uterus, unspecified: Secondary | ICD-10-CM

## 2016-11-20 DIAGNOSIS — O26893 Other specified pregnancy related conditions, third trimester: Secondary | ICD-10-CM | POA: Insufficient documentation

## 2016-11-20 DIAGNOSIS — N760 Acute vaginitis: Secondary | ICD-10-CM

## 2016-11-20 DIAGNOSIS — B9689 Other specified bacterial agents as the cause of diseases classified elsewhere: Secondary | ICD-10-CM

## 2016-11-20 DIAGNOSIS — D6959 Other secondary thrombocytopenia: Secondary | ICD-10-CM | POA: Insufficient documentation

## 2016-11-20 DIAGNOSIS — O99343 Other mental disorders complicating pregnancy, third trimester: Secondary | ICD-10-CM | POA: Diagnosis not present

## 2016-11-20 DIAGNOSIS — R109 Unspecified abdominal pain: Secondary | ICD-10-CM | POA: Insufficient documentation

## 2016-11-20 DIAGNOSIS — Z3A30 30 weeks gestation of pregnancy: Secondary | ICD-10-CM | POA: Diagnosis not present

## 2016-11-20 DIAGNOSIS — N858 Other specified noninflammatory disorders of uterus: Secondary | ICD-10-CM

## 2016-11-20 DIAGNOSIS — N898 Other specified noninflammatory disorders of vagina: Secondary | ICD-10-CM | POA: Insufficient documentation

## 2016-11-20 DIAGNOSIS — F329 Major depressive disorder, single episode, unspecified: Secondary | ICD-10-CM | POA: Diagnosis not present

## 2016-11-20 DIAGNOSIS — O23593 Infection of other part of genital tract in pregnancy, third trimester: Secondary | ICD-10-CM | POA: Diagnosis not present

## 2016-11-20 LAB — URINALYSIS, ROUTINE W REFLEX MICROSCOPIC
Bilirubin Urine: NEGATIVE
Glucose, UA: NEGATIVE mg/dL
Hgb urine dipstick: NEGATIVE
Ketones, ur: NEGATIVE mg/dL
Leukocytes, UA: NEGATIVE
Nitrite: NEGATIVE
Protein, ur: NEGATIVE mg/dL
Specific Gravity, Urine: 1.002 — ABNORMAL LOW (ref 1.005–1.030)
pH: 7 (ref 5.0–8.0)

## 2016-11-20 LAB — WET PREP, GENITAL
Sperm: NONE SEEN
Trich, Wet Prep: NONE SEEN
Yeast Wet Prep HPF POC: NONE SEEN

## 2016-11-20 MED ORDER — METRONIDAZOLE 500 MG PO TABS: 500.0000 mg | ORAL_TABLET | Freq: Two times a day (BID) | ORAL | 0 refills | Status: DC

## 2016-11-20 NOTE — MAU Provider Note (Signed)
History     CSN: [id number]  Arrival date and time: 11/20/16 7829   First Provider Initiated Contact with Patient 11/20/16 1950      Chief Complaint  Patient presents with  . Abdominal Pain   HPI Ms. Robin Nguyen is a 26 y.o. G2P1001 at [redacted]w[redacted]d who presents to MAU today with complaint of contractions since 5pm today. She denies vaginal bleeding, LOF, N/V/D, UTI symptoms or complications with the pregnancy. She reports normal fetal movement.   OB History    Gravida Para Term Preterm AB Living   2 1 1     1    SAB TAB Ectopic Multiple Live Births         0 1      Past Medical History:  Diagnosis Date  . Depression   . Gestational thrombocytopenia (HCC)   . Headache   . Hepatitis B   . Migraine   . Placenta previa 01/10/2015    Past Surgical History:  Procedure Laterality Date  . CESAREAN SECTION N/A 02/03/2015   Procedure: CESAREAN SECTION;  Surgeon: Myna Hidalgo, DO;  Location: [facility];  Service: Obstetrics;  Laterality: N/A;    Family History  Problem Relation Age of Onset  . Hypertension Mother   . Healthy Father     Social History  Substance Use Topics  . Smoking status: Never Smoker  . Smokeless tobacco: Never Used  . Alcohol use No    Allergies: No Known Allergies  No prescriptions prior to admission.    Review of Systems  Constitutional: Negative for fever.  Gastrointestinal: Positive for abdominal pain. Negative for constipation, diarrhea, nausea and vomiting.  Genitourinary: Negative for dysuria, frequency, urgency, vaginal bleeding and vaginal discharge.   Physical Exam   Blood pressure 114/66, pulse 70, temperature 97.8 F (36.6 C), temperature source Oral, resp. rate 18, not currently breastfeeding.  Physical Exam  Nursing note and vitals reviewed. Constitutional: She is oriented to person, place, and time. She appears well-developed and well-nourished. No distress.  HENT:  Head: Normocephalic and atraumatic.  Cardiovascular: Normal  rate.   Respiratory: Effort normal.  GI: Soft. She exhibits no distension and no mass. There is no tenderness. There is no rebound and no guarding.  Neurological: She is alert and oriented to person, place, and time.  Skin: Skin is warm and dry. No erythema.  Psychiatric: She has a normal mood and affect.  Dilation: Fingertip Effacement (%): 40 Cervical Position: Posterior Station: Ballotable Exam by:: Vonzella Nipple PA   Results for orders placed or performed during the hospital encounter of 11/20/16 (from the past 24 hour(s))  Urinalysis, Routine w reflex microscopic     Status: Abnormal   Collection Time: 11/20/16  7:50 PM  Result Value Ref Range   Color, Urine STRAW (A) YELLOW   APPearance CLEAR CLEAR   Specific Gravity, Urine 1.002 (L) 1.005 - 1.030   pH 7.0 5.0 - 8.0   Glucose, UA NEGATIVE NEGATIVE mg/dL   Hgb urine dipstick NEGATIVE NEGATIVE   Bilirubin Urine NEGATIVE NEGATIVE   Ketones, ur NEGATIVE NEGATIVE mg/dL   Protein, ur NEGATIVE NEGATIVE mg/dL   Nitrite NEGATIVE NEGATIVE   Leukocytes, UA NEGATIVE NEGATIVE  Wet prep, genital     Status: Abnormal   Collection Time: 11/20/16  7:55 PM  Result Value Ref Range   Yeast Wet Prep HPF POC NONE SEEN NONE SEEN   Trich, Wet Prep NONE SEEN NONE SEEN   Clue Cells Wet Prep HPF POC PRESENT (A) NONE  SEEN   WBC, Wet Prep HPF POC MODERATE (A) NONE SEEN   Sperm NONE SEEN    Fetal Monitoring: Baseline: 140 bpm Variability: moderate Accelerations: 10 x 10 Decelerations: none Contractions: mild UI  MAU Course  Procedures None  MDM UA and wet prep today  Discussed with Dr. Richardson Doppole. OK for discharge with Rx for Flagyl. Follow-up as scheduled.  Assessment and Plan  A: SIUP at 1644w5d Uterine irritability Bacterial vaginosis   P: Discharge home Rx for Flagyl given to patient  Preterm labor precautions discussed Patient advised to follow-up with Dr. Charlotta Newtonzan as scheduled for routine prenatal care or sooner PRN Patient may return  to MAU as needed or if her condition were to change or worsen   Marny LowensteinJulie N Wenzel, PA-C  11/20/2016, 10:06 PM

## 2016-11-20 NOTE — MAU Note (Signed)
Pt presents complaining of abdominal pain that started at 5pm and states it happens every 2 minutes. Denies leaking or bleeding. Reports good fetal movement.

## 2016-11-20 NOTE — Discharge Instructions (Signed)
Bacterial Vaginosis Bacterial vaginosis is an infection of the vagina. It happens when too many germs (bacteria) grow in the vagina. This infection puts you at risk for infections from sex (STIs). Treating this infection can lower your risk for some STIs. You should also treat this if you are pregnant. It can cause your baby to be born early. Follow these instructions at home: Medicines  Take over-the-counter and prescription medicines only as told by your doctor.  Take or use your antibiotic medicine as told by your doctor. Do not stop taking or using it even if you start to feel better. General instructions  If you your sexual partner is a woman, tell her that you have this infection. She needs to get treatment if she has symptoms. If you have a female partner, he does not need to be treated.  During treatment:  Avoid sex.  Do not douche.  Avoid alcohol as told.  Avoid breastfeeding as told.  Drink enough fluid to keep your pee (urine) clear or pale yellow.  Keep your vagina and butt (rectum) clean.  Wash the area with warm water every day.  Wipe from front to back after you use the toilet.  Keep all follow-up visits as told by your doctor. This is important. Preventing this condition  Do not douche.  Use only warm water to wash around your vagina.  Use protection when you have sex. This includes:  Latex condoms.  Dental dams.  Limit how many people you have sex with. It is best to only have sex with the same person (be monogamous).  Get tested for STIs. Have your partner get tested.  Wear underwear that is cotton or lined with cotton.  Avoid tight pants and pantyhose. This is most important in summer.  Do not use any products that have nicotine or tobacco in them. These include cigarettes and e-cigarettes. If you need help quitting, ask your doctor.  Do not use illegal drugs.  Limit how much alcohol you drink. Contact a doctor if:  Your symptoms do not get  better, even after you are treated.  You have more discharge or pain when you pee (urinate).  You have a fever.  You have pain in your belly (abdomen).  You have pain with sex.  Your bleed from your vagina between periods. Summary  This infection happens when too many germs (bacteria) grow in the vagina.  Treating this condition can lower your risk for some infections from sex (STIs).  You should also treat this if you are pregnant. It can cause early (premature) birth.  Do not stop taking or using your antibiotic medicine even if you start to feel better. This information is not intended to replace advice given to you by your health care provider. Make sure you discuss any questions you have with your health care provider. Document Released: 07/11/2008 Document Revised: 06/17/2016 Document Reviewed: 06/17/2016 Elsevier Interactive Patient Education  2017 [employer]  [employer] of the uterus can occur throughout pregnancy. Contractions are not always a sign that you are in labor.  WHAT ARE BRAXTON HICKS CONTRACTIONS?  Contractions that occur before labor are called Braxton Hicks contractions, or false labor. Toward the end of pregnancy (32-34 weeks), these contractions can develop more often and may become more forceful. This is not true labor because these contractions do not result in opening (dilatation) and thinning of the cervix. They are sometimes difficult to tell apart from true labor because these contractions can be  forceful and people have different pain tolerances. You should not feel embarrassed if you go to the hospital with false labor. Sometimes, the only way to tell if you are in true labor is for your health care provider to look for changes in the cervix. If there are no prenatal problems or other health problems associated with the pregnancy, it is completely safe to be sent home with false labor and await the onset of true labor. HOW  CAN YOU TELL THE DIFFERENCE BETWEEN TRUE AND FALSE LABOR? False Labor   The contractions of false labor are usually shorter and not as hard as those of true labor.   The contractions are usually irregular.   The contractions are often felt in the front of the lower abdomen and in the groin.   The contractions may go away when you walk around or change positions while lying down.   The contractions get weaker and are shorter lasting as time goes on.   The contractions do not usually become progressively stronger, regular, and closer together as with true labor.  True Labor   Contractions in true labor last 30-70 seconds, become very regular, usually become more intense, and increase in frequency.   The contractions do not go away with walking.   The discomfort is usually felt in the top of the uterus and spreads to the lower abdomen and low back.   True labor can be determined by your health care provider with an exam. This will show that the cervix is dilating and getting thinner.  WHAT TO REMEMBER  Keep up with your usual exercises and follow other instructions given by your health care provider.   Take medicines as directed by your health care provider.   Keep your regular prenatal appointments.   Eat and drink lightly if you think you are going into labor.   If Braxton Hicks contractions are making you uncomfortable:   Change your position from lying down or resting to walking, or from walking to resting.   Sit and rest in a tub of warm water.   Drink 2-3 glasses of water. Dehydration may cause these contractions.   Do slow and deep breathing several times an hour.  WHEN SHOULD I SEEK IMMEDIATE MEDICAL CARE? Seek immediate medical care if:  Your contractions become stronger, more regular, and closer together.   You have fluid leaking or gushing from your vagina.   You have a fever.   You pass blood-tinged mucus.   You have vaginal  bleeding.   You have continuous abdominal pain.   You have low back pain that you never had before.   You feel your baby's head pushing down and causing pelvic pressure.   Your baby is not moving as much as it used to.  This information is not intended to replace advice given to you by your health care provider. Make sure you discuss any questions you have with your health care provider. Document Released: 10/02/2005 Document Revised: 01/24/2016 Document Reviewed: 07/14/2013 Elsevier Interactive Patient Education  2017 [employer]

## 2017-01-09 NOTE — H&P (Signed)
HPI: 26 y/o G2P1001 @ 3130w0d estimated gestational age (as dated by LMP c/w 20 week ultrasound) presents for scheduled repeat C-section and tubal ligation.   no Leaking of Fluid,   no Vaginal Bleeding,   no Uterine Contractions,  + Fetal Movement.  ROS: no HA, no epigastric pain, no visual changes.    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 4) Depression- no meds currently    Prenatal Transfer Tool  Maternal Diabetes: No Genetic Screening: Normal Maternal Ultrasounds/Referrals: Normal Fetal Ultrasounds or other Referrals:  None Maternal Substance Abuse:  No Significant Maternal Medications:  None Significant Maternal Lab Results: Lab values include: Group B Strep positive, HBsAG positive   PNL:  GBS positive, Rub Immune, Hep B neg, RPR NR, HIV neg, GC/C neg, glucola:normal Hgb: 11.6 Blood type: O positive antibody neg  Immunizations: Tdap: 11/17/16 Flu: 07/28/16  OBHx: Prior C-section for fetal intolerance to labor- c/b IUGR, 5#11oz PMHx:  Chronic Hep B, HSV Meds:  PNV, (did not start valtrex) Allergy:  No Known Allergies SurgHx: primary C-section SocHx:   no Tobacco, no  EtOH, no Illicit Drugs  O: to be obtained on arrival Examination performed in office Gen. AAOx3, NAD CV.  RRR  No murmur.  Resp. CTAB, no wheeze or crackles. Abd. Gravid,  no tenderness,  no rigidity,  no guarding Extr.  no edema B/L , no calf tenderness  FHT: 145  Labs: see orders  A/P:  26 y.o. G2P1001 @ 9430w0d EGA who presents for elective repeat C-section and tubal ligation -FWB:  Reassuring by doppler -NPO -LR @ 125cc/hr -SCDs to OR -Ancef 2g IV to OR Risk benefits and alternatives of cesarean section were discussed with the patient including but not limited to infection, bleeding, damage to bowel , bladder and baby with the need for further surgery. Pt  voiced understanding and desires to proceed.  Bilateral tubal ligation reviewed with R&B including but not limited to bleeding, infection, injury to other organs, irreversibility and failure rate pf 10/998.  Questions and concerns were addressed and pt wishes to proceed.  Myna HidalgoJennifer Tanvi Gatling, DO [phone] (pager) [phone] (office)

## 2017-01-10 ENCOUNTER — Encounter (HOSPITAL_COMMUNITY): Payer: Self-pay

## 2017-01-16 ENCOUNTER — Encounter (HOSPITAL_COMMUNITY)
Admission: RE | Admit: 2017-01-16 | Discharge: 2017-01-16 | Disposition: A | Discharge status: Home / Self Care | Payer: Medicaid Other | Source: Ambulatory Visit | Attending: Obstetrics & Gynecology | Admitting: Obstetrics & Gynecology

## 2017-01-16 HISTORY — DX: Anogenital herpesviral infection, unspecified: A60.9

## 2017-01-16 LAB — CBC
HCT: 34.6 % — ABNORMAL LOW (ref 36.0–46.0)
Hemoglobin: 11.9 g/dL — ABNORMAL LOW (ref 12.0–15.0)
MCH: 29.7 pg (ref 26.0–34.0)
MCHC: 34.4 g/dL (ref 30.0–36.0)
MCV: 86.3 fL (ref 78.0–100.0)
Platelets: 104 10*3/uL — ABNORMAL LOW (ref 150–400)
RBC: 4.01 MIL/uL (ref 3.87–5.11)
RDW: 13.3 % (ref 11.5–15.5)
WBC: 6.4 10*3/uL (ref 4.0–10.5)

## 2017-01-16 LAB — TYPE AND SCREEN
ABO/RH(D): O POS
Antibody Screen: NEGATIVE

## 2017-01-16 NOTE — Pre-Procedure Instructions (Signed)
Dr Jean Rosenthal aware of low platelet count preop.  Repeat CBC ordered for in the morning.

## 2017-01-16 NOTE — Patient Instructions (Signed)
20 Tiarna Koppen  01/16/2017   Your procedure is scheduled on:  01/17/2017  Enter through the Main Entrance of [facility] at 1045 AM.  Pick up the phone at the desk and dial [phone].   Call this number if you have problems the morning of surgery: [phone]   Remember:   Do not eat food:After Midnight.  Do not drink clear liquids: After Midnight.  Take these medicines the morning of surgery with A SIP OF WATER: NONE   Do not wear jewelry, make-up or nail polish.  Do not wear lotions, powders, or perfumes. Do not wear deodorant.  Do not shave 48 hours prior to surgery.  Do not bring valuables to the hospital.  [facility] is not   responsible for any belongings or valuables brought to the hospital.  Contacts, dentures or bridgework may not be worn into surgery.  Leave suitcase in the car. After surgery it may be brought to your room.  For patients admitted to the hospital, checkout time is 11:00 AM the day of              discharge.   Patients discharged the day of surgery will not be allowed to drive             home.  Name and phone number of your driver: NA  Special Instructions:   N/A   Please read over the following fact sheets that you were given:   Surgical Site Infection Prevention

## 2017-01-17 ENCOUNTER — Inpatient Hospital Stay (HOSPITAL_COMMUNITY): Payer: Medicaid Other | Admitting: Anesthesiology

## 2017-01-17 ENCOUNTER — Encounter (HOSPITAL_COMMUNITY): Payer: Self-pay

## 2017-01-17 ENCOUNTER — Encounter (HOSPITAL_COMMUNITY): Payer: Self-pay | Admitting: *Deleted

## 2017-01-17 ENCOUNTER — Inpatient Hospital Stay (HOSPITAL_COMMUNITY)
Admission: RE | Admit: 2017-01-17 | Discharge: 2017-01-19 | DRG: 765 | Disposition: A | Discharge status: Home / Self Care | Payer: Medicaid Other | Source: Ambulatory Visit | Attending: Obstetrics & Gynecology | Admitting: Obstetrics & Gynecology

## 2017-01-17 ENCOUNTER — Inpatient Hospital Stay (EMERGENCY_DEPARTMENT_HOSPITAL)
Admission: AD | Admit: 2017-01-17 | Discharge: 2017-01-17 | Disposition: A | Discharge status: Home / Self Care | Payer: Medicaid Other | Source: Ambulatory Visit | Attending: Obstetrics & Gynecology | Admitting: Obstetrics & Gynecology

## 2017-01-17 ENCOUNTER — Encounter (HOSPITAL_COMMUNITY)
Admission: RE | Disposition: A | Discharge status: Home / Self Care | Payer: Self-pay | Source: Ambulatory Visit | Attending: Obstetrics & Gynecology

## 2017-01-17 DIAGNOSIS — O471 False labor at or after 37 completed weeks of gestation: Secondary | ICD-10-CM | POA: Insufficient documentation

## 2017-01-17 DIAGNOSIS — O99344 Other mental disorders complicating childbirth: Secondary | ICD-10-CM | POA: Diagnosis present

## 2017-01-17 DIAGNOSIS — O34211 Maternal care for low transverse scar from previous cesarean delivery: Principal | ICD-10-CM | POA: Diagnosis present

## 2017-01-17 DIAGNOSIS — O99824 Streptococcus B carrier state complicating childbirth: Secondary | ICD-10-CM | POA: Diagnosis present

## 2017-01-17 DIAGNOSIS — B181 Chronic viral hepatitis B without delta-agent: Secondary | ICD-10-CM | POA: Diagnosis present

## 2017-01-17 DIAGNOSIS — O9912 Other diseases of the blood and blood-forming organs and certain disorders involving the immune mechanism complicating childbirth: Secondary | ICD-10-CM | POA: Diagnosis present

## 2017-01-17 DIAGNOSIS — O34219 Maternal care for unspecified type scar from previous cesarean delivery: Secondary | ICD-10-CM

## 2017-01-17 DIAGNOSIS — O479 False labor, unspecified: Secondary | ICD-10-CM

## 2017-01-17 DIAGNOSIS — O9842 Viral hepatitis complicating childbirth: Secondary | ICD-10-CM | POA: Diagnosis present

## 2017-01-17 DIAGNOSIS — Z302 Encounter for sterilization: Secondary | ICD-10-CM

## 2017-01-17 DIAGNOSIS — Z3A39 39 weeks gestation of pregnancy: Secondary | ICD-10-CM | POA: Insufficient documentation

## 2017-01-17 DIAGNOSIS — F329 Major depressive disorder, single episode, unspecified: Secondary | ICD-10-CM | POA: Diagnosis present

## 2017-01-17 DIAGNOSIS — D6959 Other secondary thrombocytopenia: Secondary | ICD-10-CM | POA: Diagnosis present

## 2017-01-17 DIAGNOSIS — Z3493 Encounter for supervision of normal pregnancy, unspecified, third trimester: Secondary | ICD-10-CM

## 2017-01-17 LAB — CBC
HCT: 30.9 % — ABNORMAL LOW (ref 36.0–46.0)
Hemoglobin: 10.9 g/dL — ABNORMAL LOW (ref 12.0–15.0)
MCH: 30.4 pg (ref 26.0–34.0)
MCHC: 35.3 g/dL (ref 30.0–36.0)
MCV: 86.1 fL (ref 78.0–100.0)
Platelets: 96 10*3/uL — ABNORMAL LOW (ref 150–400)
RBC: 3.59 MIL/uL — ABNORMAL LOW (ref 3.87–5.11)
RDW: 13.3 % (ref 11.5–15.5)
WBC: 6.1 10*3/uL (ref 4.0–10.5)

## 2017-01-17 LAB — RPR: RPR Ser Ql: NONREACTIVE

## 2017-01-17 SURGERY — Surgical Case
Anesthesia: Spinal

## 2017-01-17 MED ORDER — KETOROLAC TROMETHAMINE 30 MG/ML IJ SOLN
30.0000 mg | Freq: Once | INTRAMUSCULAR | Status: DC | PRN
  Administered 2017-01-17: 30 mg via INTRAVENOUS

## 2017-01-17 MED ORDER — ONDANSETRON HCL 4 MG/2ML IJ SOLN
INTRAMUSCULAR | Status: AC
  Filled 2017-01-17: qty 2

## 2017-01-17 MED ORDER — KETOROLAC TROMETHAMINE 30 MG/ML IJ SOLN
INTRAMUSCULAR | Status: AC
  Filled 2017-01-17: qty 1

## 2017-01-17 MED ORDER — IBUPROFEN 600 MG PO TABS
600.0000 mg | ORAL_TABLET | Freq: Four times a day (QID) | ORAL | Status: DC
  Administered 2017-01-17 – 2017-01-19 (×6): 600 mg via ORAL
  Filled 2017-01-17 (×7): qty 1

## 2017-01-17 MED ORDER — LACTATED RINGERS IV BOLUS (SEPSIS)
1000.0000 mL | Freq: Once | INTRAVENOUS | Status: AC
  Administered 2017-01-17: 1000 mL via INTRAVENOUS

## 2017-01-17 MED ORDER — SIMETHICONE 80 MG PO CHEW: 80.0000 mg | CHEWABLE_TABLET | ORAL | Status: DC | PRN

## 2017-01-17 MED ORDER — LACTATED RINGERS IV SOLN: INTRAVENOUS | Status: DC

## 2017-01-17 MED ORDER — BUPIVACAINE IN DEXTROSE 0.75-8.25 % IT SOLN
INTRATHECAL | Status: DC | PRN
  Administered 2017-01-17: 9 mg via INTRATHECAL

## 2017-01-17 MED ORDER — OXYTOCIN 40 UNITS IN LACTATED RINGERS INFUSION - SIMPLE MED: 2.5000 [IU]/h | INTRAVENOUS | Status: AC

## 2017-01-17 MED ORDER — FENTANYL CITRATE (PF) 100 MCG/2ML IJ SOLN
INTRAMUSCULAR | Status: AC
  Filled 2017-01-17: qty 2

## 2017-01-17 MED ORDER — OXYCODONE HCL 5 MG PO TABS
5.0000 mg | ORAL_TABLET | ORAL | Status: DC | PRN
  Administered 2017-01-18 – 2017-01-19 (×2): 5 mg via ORAL
  Filled 2017-01-17 (×2): qty 1

## 2017-01-17 MED ORDER — ACETAMINOPHEN 325 MG PO TABS
650.0000 mg | ORAL_TABLET | ORAL | Status: DC | PRN
Start: 2017-01-17 — End: 2017-01-19
  Administered 2017-01-18 – 2017-01-19 (×2): 650 mg via ORAL
  Filled 2017-01-17 (×2): qty 2

## 2017-01-17 MED ORDER — SODIUM CHLORIDE 0.9 % IR SOLN
Status: DC | PRN
  Administered 2017-01-17: 1

## 2017-01-17 MED ORDER — OXYTOCIN 10 UNIT/ML IJ SOLN
INTRAVENOUS | Status: DC | PRN
  Administered 2017-01-17: 40 [IU] via INTRAVENOUS

## 2017-01-17 MED ORDER — HYDROMORPHONE HCL 1 MG/ML IJ SOLN: 0.2500 mg | INTRAMUSCULAR | Status: DC | PRN

## 2017-01-17 MED ORDER — MORPHINE SULFATE (PF) 0.5 MG/ML IJ SOLN
INTRAMUSCULAR | Status: AC
  Filled 2017-01-17: qty 10

## 2017-01-17 MED ORDER — FENTANYL CITRATE (PF) 100 MCG/2ML IJ SOLN
INTRAMUSCULAR | Status: DC | PRN
  Administered 2017-01-17: 20 ug via INTRATHECAL

## 2017-01-17 MED ORDER — COCONUT OIL OIL
1.0000 | TOPICAL_OIL | Status: DC | PRN
Start: 2017-01-17 — End: 2017-01-19

## 2017-01-17 MED ORDER — WITCH HAZEL-GLYCERIN EX PADS: 1.0000 "application " | MEDICATED_PAD | CUTANEOUS | Status: DC | PRN

## 2017-01-17 MED ORDER — ZOLPIDEM TARTRATE 5 MG PO TABS: 5.0000 mg | ORAL_TABLET | Freq: Every evening | ORAL | Status: DC | PRN

## 2017-01-17 MED ORDER — DIPHENHYDRAMINE HCL 25 MG PO CAPS
25.0000 mg | ORAL_CAPSULE | Freq: Four times a day (QID) | ORAL | Status: DC | PRN
  Administered 2017-01-17 – 2017-01-18 (×2): 25 mg via ORAL
  Filled 2017-01-17 (×2): qty 1

## 2017-01-17 MED ORDER — PHENYLEPHRINE 8 MG IN D5W 100 ML (0.08MG/ML) PREMIX OPTIME
INJECTION | INTRAVENOUS | Status: AC
  Filled 2017-01-17: qty 100

## 2017-01-17 MED ORDER — CEFAZOLIN SODIUM-DEXTROSE 2-4 GM/100ML-% IV SOLN
2.0000 g | INTRAVENOUS | Status: AC
  Administered 2017-01-17: 2 g via INTRAVENOUS
  Filled 2017-01-17: qty 100

## 2017-01-17 MED ORDER — OXYCODONE HCL 5 MG PO TABS: 10.0000 mg | ORAL_TABLET | ORAL | Status: DC | PRN

## 2017-01-17 MED ORDER — SENNOSIDES-DOCUSATE SODIUM 8.6-50 MG PO TABS
2.0000 | ORAL_TABLET | ORAL | Status: DC
  Administered 2017-01-17 – 2017-01-19 (×2): 2 via ORAL
  Filled 2017-01-17 (×2): qty 2

## 2017-01-17 MED ORDER — SCOPOLAMINE 1 MG/3DAYS TD PT72: 1.0000 | MEDICATED_PATCH | Freq: Once | TRANSDERMAL | Status: DC

## 2017-01-17 MED ORDER — NALBUPHINE HCL 10 MG/ML IJ SOLN
INTRAMUSCULAR | Status: AC
  Filled 2017-01-17: qty 1

## 2017-01-17 MED ORDER — MENTHOL 3 MG MT LOZG: 1.0000 | LOZENGE | OROMUCOSAL | Status: DC | PRN

## 2017-01-17 MED ORDER — NALBUPHINE HCL 10 MG/ML IJ SOLN
5.0000 mg | INTRAMUSCULAR | Status: DC | PRN
  Administered 2017-01-17 – 2017-01-18 (×2): 5 mg via INTRAVENOUS
  Filled 2017-01-17: qty 1

## 2017-01-17 MED ORDER — MEPERIDINE HCL 25 MG/ML IJ SOLN: 6.2500 mg | INTRAMUSCULAR | Status: DC | PRN

## 2017-01-17 MED ORDER — PRENATAL MULTIVITAMIN CH
1.0000 | ORAL_TABLET | Freq: Every day | ORAL | Status: DC
  Administered 2017-01-18: 1 via ORAL
  Filled 2017-01-17: qty 1

## 2017-01-17 MED ORDER — LACTATED RINGERS IV SOLN
INTRAVENOUS | Status: DC
  Administered 2017-01-17 (×3): via INTRAVENOUS

## 2017-01-17 MED ORDER — BUPIVACAINE IN DEXTROSE 0.75-8.25 % IT SOLN
INTRATHECAL | Status: AC
  Filled 2017-01-17: qty 2

## 2017-01-17 MED ORDER — PHENYLEPHRINE 8 MG IN D5W 100 ML (0.08MG/ML) PREMIX OPTIME
INJECTION | INTRAVENOUS | Status: DC | PRN
  Administered 2017-01-17: 60 ug/min via INTRAVENOUS

## 2017-01-17 MED ORDER — MORPHINE SULFATE-NACL 0.5-0.9 MG/ML-% IV SOSY
PREFILLED_SYRINGE | INTRAVENOUS | Status: DC | PRN
Start: 2017-01-17 — End: 2017-01-17
  Administered 2017-01-17: .2 mg via INTRATHECAL

## 2017-01-17 MED ORDER — PROMETHAZINE HCL 25 MG/ML IJ SOLN
6.2500 mg | INTRAMUSCULAR | Status: DC | PRN
Start: 2017-01-17 — End: 2017-01-17

## 2017-01-17 MED ORDER — ONDANSETRON HCL 4 MG/2ML IJ SOLN
INTRAMUSCULAR | Status: DC | PRN
  Administered 2017-01-17: 4 mg via INTRAVENOUS

## 2017-01-17 MED ORDER — OXYTOCIN 10 UNIT/ML IJ SOLN
INTRAMUSCULAR | Status: AC
  Filled 2017-01-17: qty 4

## 2017-01-17 MED ORDER — LACTATED RINGERS IV SOLN
INTRAVENOUS | Status: DC
  Administered 2017-01-17: 22:00:00 via INTRAVENOUS

## 2017-01-17 MED ORDER — DIBUCAINE 1 % RE OINT: 1.0000 "application " | TOPICAL_OINTMENT | RECTAL | Status: DC | PRN

## 2017-01-17 MED ORDER — SIMETHICONE 80 MG PO CHEW
80.0000 mg | CHEWABLE_TABLET | Freq: Three times a day (TID) | ORAL | Status: DC
  Administered 2017-01-17 – 2017-01-19 (×5): 80 mg via ORAL
  Filled 2017-01-17 (×5): qty 1

## 2017-01-17 MED ORDER — SIMETHICONE 80 MG PO CHEW
80.0000 mg | CHEWABLE_TABLET | ORAL | Status: DC
  Administered 2017-01-17 – 2017-01-19 (×2): 80 mg via ORAL
  Filled 2017-01-17 (×2): qty 1

## 2017-01-17 SURGICAL SUPPLY — 41 items
BARRIER ADHS 3X4 INTERCEED (GAUZE/BANDAGES/DRESSINGS) ×2 IMPLANT
BENZOIN TINCTURE PRP APPL 2/3 (GAUZE/BANDAGES/DRESSINGS) ×2 IMPLANT
CLAMP CORD UMBIL (MISCELLANEOUS) IMPLANT
CLOSURE STERI STRIP 1/2 X4 (GAUZE/BANDAGES/DRESSINGS) ×2 IMPLANT
CLOTH BEACON ORANGE TIMEOUT ST (SAFETY) ×2 IMPLANT
DERMABOND ADVANCED (GAUZE/BANDAGES/DRESSINGS)
DERMABOND ADVANCED .7 DNX12 (GAUZE/BANDAGES/DRESSINGS) IMPLANT
DRSG OPSITE POSTOP 4X10 (GAUZE/BANDAGES/DRESSINGS) ×2 IMPLANT
DURAPREP 26ML APPLICATOR (WOUND CARE) ×2 IMPLANT
ELECT REM PT RETURN 9FT ADLT (ELECTROSURGICAL) ×2
ELECTRODE REM PT RTRN 9FT ADLT (ELECTROSURGICAL) ×1 IMPLANT
EXTRACTOR VACUUM KIWI (MISCELLANEOUS) IMPLANT
GLOVE BIOGEL PI IND STRL 6.5 (GLOVE) ×1 IMPLANT
GLOVE BIOGEL PI IND STRL 7.0 (GLOVE) ×4 IMPLANT
GLOVE BIOGEL PI INDICATOR 6.5 (GLOVE) ×1
GLOVE BIOGEL PI INDICATOR 7.0 (GLOVE) ×4
GLOVE ECLIPSE 6.5 STRL STRAW (GLOVE) ×2 IMPLANT
GOWN STRL REUS W/TWL LRG LVL3 (GOWN DISPOSABLE) ×4 IMPLANT
KIT ABG SYR 3ML LUER SLIP (SYRINGE) IMPLANT
NEEDLE HYPO 25X5/8 SAFETYGLIDE (NEEDLE) IMPLANT
NS IRRIG 1000ML POUR BTL (IV SOLUTION) ×2 IMPLANT
PACK C SECTION WH (CUSTOM PROCEDURE TRAY) ×2 IMPLANT
PAD ABD 7.5X8 STRL (GAUZE/BANDAGES/DRESSINGS) ×2 IMPLANT
PAD OB MATERNITY 4.3X12.25 (PERSONAL CARE ITEMS) ×2 IMPLANT
PENCIL SMOKE EVAC W/HOLSTER (ELECTROSURGICAL) ×2 IMPLANT
RETRACTOR WND ALEXIS 25 LRG (MISCELLANEOUS) ×1 IMPLANT
RTRCTR C-SECT PINK 25CM LRG (MISCELLANEOUS) ×2 IMPLANT
RTRCTR WOUND ALEXIS 25CM LRG (MISCELLANEOUS) ×2
STRIP CLOSURE SKIN 1/2X4 (GAUZE/BANDAGES/DRESSINGS) ×2 IMPLANT
SUT PLAIN 0 NONE (SUTURE) ×2 IMPLANT
SUT PLAIN 2 0 XLH (SUTURE) IMPLANT
SUT VIC AB 0 CT1 27 (SUTURE) ×2
SUT VIC AB 0 CT1 27XBRD ANBCTR (SUTURE) ×2 IMPLANT
SUT VIC AB 0 CTX 36 (SUTURE) ×3
SUT VIC AB 0 CTX36XBRD ANBCTRL (SUTURE) ×3 IMPLANT
SUT VIC AB 2-0 CT1 (SUTURE) ×2 IMPLANT
SUT VIC AB 2-0 CT1 27 (SUTURE) ×1
SUT VIC AB 2-0 CT1 TAPERPNT 27 (SUTURE) ×1 IMPLANT
SUT VIC AB 4-0 KS 27 (SUTURE) ×2 IMPLANT
TOWEL OR 17X24 6PK STRL BLUE (TOWEL DISPOSABLE) ×2 IMPLANT
TRAY FOLEY BAG SILVER LF 14FR (SET/KITS/TRAYS/PACK) IMPLANT

## 2017-01-17 NOTE — Transfer of Care (Signed)
Immediate Anesthesia Transfer of Care Note  Patient: Robin Nguyen  Procedure(s) Performed: Procedure(s) with comments: CESAREAN SECTION WITH BILATERAL TUBAL LIGATION (N/A) - EDD 01/24/17  Patient Location: PACU  Anesthesia Type:Spinal  Level of Consciousness: awake  Airway & Oxygen Therapy: Patient Spontanous Breathing  Post-op Assessment: Report given to RN  Post vital signs: Reviewed and stable  Last Vitals:  Vitals:   01/17/17 1115  BP: 123/71  Pulse: 74  Resp: 18  Temp: 36.7 C    Last Pain:  Vitals:   01/17/17 1115  TempSrc: Oral  PainSc: 0-No pain         Complications: No apparent anesthesia complications

## 2017-01-17 NOTE — Interval H&P Note (Signed)
History and Physical Interval Note:  01/17/2017 12:04 PM  Robin Nguyen  has presented today for surgery, with the diagnosis of Z98.891 H/O Cesarean Section  The various methods of treatment have been discussed with the patient and family. After consideration of risks, benefits and other options for treatment, the patient has consented to  Procedure(s) with comments: CESAREAN SECTION WITH BILATERAL TUBAL LIGATION (N/A) - EDD 01/24/17 as a surgical intervention .  The patient's history has been reviewed, patient examined, no change in status, stable for surgery.  I have reviewed the patient's chart and labs.  Questions were answered to the patient's satisfaction.     Myna Hidalgo, M

## 2017-01-17 NOTE — MAU Note (Signed)
Patient presents with ctx every 5 mins. Patient denies any bleeding or LOF. Fetus active. Patient scheduled for section today at 10am

## 2017-01-17 NOTE — Consult Note (Signed)
Neonatology Note:   Attendance at C-section:    I was asked by Dr. Charlotta Newton to attend this repeat C/S at term. The mother is a G2P1 O pos, GBS positive with gestational thrombocytopenia, chronic Hepatitis B, and a history of HSV, without recent outbreaks. ROM at delivery, fluid clear. Infant vigorous with good spontaneous cry and tone. Needed only minimal bulb suctioning. Ap 8/9. Lungs clear to ausc in DR. To CN to care of Pediatrician.   Doretha Sou, MD

## 2017-01-17 NOTE — MAU Note (Signed)
I have communicated with Dr. Charlotta Newton  and reviewed vital signs:  Vitals:   01/17/17 0049  BP: 113/80  Pulse: 60  Resp: 18  Temp: 98.6 F (37 C)    Vaginal exam:  Dilation: Closed Effacement (%): Thick Cervical Position: Posterior Exam by:: Rwanda Harraway-Smith, CNM,   Also reviewed contraction pattern and that non-stress test is reactive.  It has been documented that patient is contracting every 5-10 minutes with no cervical change over 1 hour not indicating active labor.  Patient denies any other complaints.  Based on this report provider has given order for discharge.  A discharge order and diagnosis entered by a provider.   Labor discharge instructions reviewed with patient.

## 2017-01-17 NOTE — MAU Provider Note (Signed)
HPI: Robin Nguyen is a 26 y.o. year old G65P1001 female at [redacted]w[redacted]d weeks gestation who presents to MAU reporting contractions. Denies LOF or VB. Pos FM. Repeat C/S scheduled for 10:00am.  FHR reactive Contractions Q 1-4, mild-mod  Cervix closed and long. LR bolus given per Dr. Charlotta Newton. Contractions decreased per toco and pt report. Cervix unchanged.  1. False labor   2. Previous cesarean section complicating pregnancy, antepartum condition or complication    D/C home per Dr. Charlotta Newton.  F/U as scheduled for 10:00 am C/S. Labor precautions and FKCs.  [place], CNM 01/17/2017 3:08 AM

## 2017-01-17 NOTE — Progress Notes (Signed)
Dr. Richardson Dopp said it was okay to give motrin as long as platelets are above 90. Royston Cowper, RN

## 2017-01-17 NOTE — Anesthesia Preprocedure Evaluation (Signed)
Anesthesia Evaluation  Patient identified by MRN, date of birth, ID band Patient awake    Reviewed: Allergy & Precautions, H&P , NPO status , Patient's Chart, lab work & pertinent test results  Airway Mallampati: I  TM Distance: >3 FB Neck ROM: full    Dental no notable dental hx.    Pulmonary neg pulmonary ROS,    Pulmonary exam normal breath sounds clear to auscultation       Cardiovascular negative cardio ROS Normal cardiovascular exam     Neuro/Psych    GI/Hepatic negative GI ROS, Neg liver ROS,   Endo/Other  negative endocrine ROS  Renal/GU negative Renal ROS     Musculoskeletal negative musculoskeletal ROS (+)   Abdominal Normal abdominal exam  (+)   Peds  Hematology negative hematology ROS (+)   Anesthesia Other Findings   Reproductive/Obstetrics (+) Pregnancy                             Anesthesia Physical Anesthesia Plan  ASA: II  Anesthesia Plan: Spinal   Post-op Pain Management:    Induction:   Airway Management Planned:   Additional Equipment:   Intra-op Plan:   Post-operative Plan:   Informed Consent: I have reviewed the patients History and Physical, chart, labs and discussed the procedure including the risks, benefits and alternatives for the proposed anesthesia with the patient or authorized representative who has indicated his/her understanding and acceptance.     Plan Discussed with: CRNA and Surgeon  Anesthesia Plan Comments:         Anesthesia Quick Evaluation

## 2017-01-17 NOTE — Op Note (Signed)
PreOp Diagnosis: 1) Intrauterine pregnancy @ [redacted]w[redacted]d 2) Desire for permanent sterilization PostOp Diagnosis: same Procedure: Repeat C-section and bilateral tubal ligation Surgeon: Dr. Myna Hidalgo Assistant: Dr. Gerald Leitz Anesthesia: spinal Complications: none EBL: 800cc UOP: 300cc Fluids: 2200  Findings: female infant from cephalic presentation, normal uterus, tubes and ovaries bilaterally Path: portion of bilateral fallopian tubes  PROCEDURE:  Informed consent was obtained from the patient with risks, benefits, complications, treatment options, and expected outcomes discussed with the patient.  The patient concurred with the proposed plan, giving informed consent with form signed.   The patient was taken to Operating Room, and identified with the procedure verified as C-Section Delivery with Time Out. With induction of anesthesia, the patient was prepped and draped in the usual sterile fashion. A Pfannenstiel incision was made and carried down through the subcutaneous tissue to the fascia. The fascia was incised in the midline and extended transversely. The superior aspect of the fascial incision was grasped with Kochers elevated and the underlying muscle dissected off. The inferior aspect of the facial incision was in similar fashion, grasped elevated and rectus muscles dissected off. The peritoneum was identified and entered. Peritoneal incision was extended longitudinally. The Alexis retractor was placed.  The utero-vesical peritoneal reflection was identified and incised transversely with the [facility] scissors, the incision extended laterally, the bladder flap created digitally. A low transverse uterine incision was made and the infants head delivered atraumatically. After the umbilical cord was clamped and cut cord blood was obtained for evaluation.   The placenta was removed intact and appeared normal. The uterine outline, tubes and ovaries appeared normal. The uterine incision was closed with  running locked sutures of 0 Vicryl and a second layer of the same stitch was used in an imbricating fashion.   Attention was turned to the right side, where the right fallopian tube was  grasped with Babcock forceps and exteriorized until the fimbria was visualized.  A knuckle of tube was made on the right using a free tie and individual ties of 0 plain catgut suture were used.  The knuckle of tube was then excised. Hemostasis was achieved with the bovie. An identical procedure was carried out on the opposite side. Again hemostasis was adequate. Excellent hemostasis was obtained.    The pericolic gutters were then cleared of all clots and debris. Interceed was placed over the hysterotomy.  The peritoneum was closed in a running fashion.  The fascia was then reapproximated with running sutures of 0 Vicryl.  The skin was closed with 4-0 vicryl in a subcuticular fashion.Instrument, sponge, and needle counts were correct prior the abdominal closure and at the conclusion of the case. The patient was taken to recovery in stable condition.  Dr. Gerald Leitz was present to assist as the patient had prior abdominal surgery and residents are not available to our service.  Myna Hidalgo, DO [phone] (pager) [phone] (office)

## 2017-01-17 NOTE — Discharge Instructions (Signed)

## 2017-01-17 NOTE — Anesthesia Postprocedure Evaluation (Signed)
Anesthesia Post Note  Patient: Robin Nguyen  Procedure(s) Performed: Procedure(s) (LRB): CESAREAN SECTION WITH BILATERAL TUBAL LIGATION (N/A)  Patient location during evaluation: PACU Anesthesia Type: Spinal Level of consciousness: awake Pain management: pain level controlled Vital Signs Assessment: post-procedure vital signs reviewed and stable Respiratory status: spontaneous breathing Cardiovascular status: stable Postop Assessment: no headache, no backache, spinal receding, patient able to bend at knees and no signs of nausea or vomiting Anesthetic complications: no        Last Vitals:  Vitals:   01/17/17 1500 01/17/17 1515  BP: 100/68 99/68  Pulse: (!) 50 (!) 52  Resp: 16 16  Temp: 36.4 C     Last Pain:  Vitals:   01/17/17 1515  TempSrc:   PainSc: 0-No pain   Pain Goal:                 Guiselle Mian JR,JOHN Shmuel Girgis

## 2017-01-17 NOTE — Anesthesia Procedure Notes (Signed)
Spinal  Patient location during procedure: OR Start time: 01/17/2017 12:30 PM End time: 01/17/2017 12:33 PM Staffing Anesthesiologist: Leilani Able Performed: anesthesiologist  Preanesthetic Checklist Completed: patient identified, surgical consent, pre-op evaluation, timeout performed, IV checked, risks and benefits discussed and monitors and equipment checked Spinal Block Patient position: sitting Prep: site prepped and draped and DuraPrep Patient monitoring: heart rate, cardiac monitor, continuous pulse ox and blood pressure Approach: midline Location: L3-4 Injection technique: single-shot Needle Needle type: Sprotte  Needle gauge: 24 G Needle length: 9 cm Needle insertion depth: 4 cm Assessment Sensory level: T4

## 2017-01-17 NOTE — Lactation Note (Signed)
This note was copied from a baby's chart. Lactation Consultation Note  Patient Name: Robin Nguyen ZOXWR'U Date: 01/17/2017 Reason for consult: Initial assessment   Initial assessment with Exp BF mom of 1 hour old infant in PACU. Mom reports she BF her 22 month old for 4 months. She is planning to BF this infant for 6 months. Maternal history of Hepatitis B, infant to receive Hep B and HBIG. Mom native of [place], speaks fluent [place].   Infant was latched to left breast in the cross cradle hold when I entered PACU. He continued to feed on that breast for a few minutes. I then assisted mom in latching infant to right breast in the cross cradle hold. Infant latched and fed actively another 5 minutes, infant with frequent swallows/gulp. Infant self detached and was left STS with mom.   Mom with compressible breasts with large everted nipples. Mom is able to hand express and colostrum easily expressible. Enc mom to hand express prior to each feeding and after feeding. Enc mom to feed infant STS 8-12 x in 24 hours at first feeding cues. Enc mom to use pillow/head support with feeding. Mom was massaging/compressing breast with feeding. Feeding log given with instructions for use.   BF Resources Hand out and [facility] Brochure given, mom informed of IP/OP services, BF Support Groups and LC phone #. Enc mom to call out for assistance as needed.    Maternal Data Formula Feeding for Exclusion: Yes Reason for exclusion: Mother's choice to formula and breast feed on admission Has patient been taught Hand Expression?: Yes Does the patient have breastfeeding experience prior to this delivery?: Yes  Feeding Feeding Type: Breast Fed Length of feed: 10 min  LATCH Score/Interventions Latch: Grasps breast easily, tongue down, lips flanged, rhythmical sucking.  Audible Swallowing: Spontaneous and intermittent Intervention(s): Skin to skin  Type of Nipple: Everted at rest and after stimulation  Comfort  (Breast/Nipple): Soft / non-tender     Hold (Positioning): Assistance needed to correctly position infant at breast and maintain latch. Intervention(s): Breastfeeding basics reviewed;Support Pillows;Position options;Skin to skin  LATCH Score: 9  Lactation Tools Discussed/Used WIC Program: Yes   Consult Status Consult Status: Follow-up Date: 01/18/17 Follow-up type: In-patient    Silas Flood Zunaira Lamy 01/17/2017, 2:39 PM

## 2017-01-17 NOTE — MAU Note (Signed)
Notified provider that patient is here for a labor eval and has a section scheduled for 10am. Patient is closed and thick. Provider said to start an IV bolus on patient and recheck her an hour from the first check.

## 2017-01-18 ENCOUNTER — Encounter (HOSPITAL_COMMUNITY): Payer: Self-pay | Admitting: *Deleted

## 2017-01-18 LAB — CBC
HCT: 34.7 % — ABNORMAL LOW (ref 36.0–46.0)
Hemoglobin: 12 g/dL (ref 12.0–15.0)
MCH: 29.7 pg (ref 26.0–34.0)
MCHC: 34.6 g/dL (ref 30.0–36.0)
MCV: 85.9 fL (ref 78.0–100.0)
Platelets: 99 10*3/uL — ABNORMAL LOW (ref 150–400)
RBC: 4.04 MIL/uL (ref 3.87–5.11)
RDW: 13.3 % (ref 11.5–15.5)
WBC: 6.9 10*3/uL (ref 4.0–10.5)

## 2017-01-18 MED ORDER — GLYCOPYRROLATE 0.2 MG/ML IJ SOLN
INTRAMUSCULAR | Status: AC
  Filled 2017-01-18: qty 1

## 2017-01-18 MED ORDER — OXYCODONE HCL 5 MG PO TABS: 5.0000 mg | ORAL_TABLET | Freq: Four times a day (QID) | ORAL | 0 refills | Status: DC | PRN

## 2017-01-18 MED ORDER — IBUPROFEN 600 MG PO TABS: 600.0000 mg | ORAL_TABLET | Freq: Four times a day (QID) | ORAL | 0 refills | Status: DC

## 2017-01-18 MED ORDER — DOCUSATE SODIUM 100 MG PO CAPS: 100.0000 mg | ORAL_CAPSULE | Freq: Two times a day (BID) | ORAL | 0 refills | Status: DC

## 2017-01-18 NOTE — Discharge Instructions (Signed)

## 2017-01-18 NOTE — Anesthesia Postprocedure Evaluation (Addendum)
Anesthesia Post Note  Patient: Robin Nguyen  Procedure(s) Performed: Procedure(s) (LRB): CESAREAN SECTION WITH BILATERAL TUBAL LIGATION (N/A)  Patient location during evaluation: Mother Baby Anesthesia Type: Spinal Level of consciousness: awake, awake and alert, oriented and patient cooperative Pain management: pain level controlled Vital Signs Assessment: post-procedure vital signs reviewed and stable Respiratory status: spontaneous breathing, nonlabored ventilation and respiratory function stable Cardiovascular status: stable Postop Assessment: no headache, no backache, patient able to bend at knees and no signs of nausea or vomiting Anesthetic complications: no        Last Vitals:  Vitals:   01/18/17 0135 01/18/17 0643  BP: 103/67 109/69  Pulse: 62 72  Resp: 18 18  Temp: 36.4 C 36.4 C    Last Pain:  Vitals:   01/18/17 0643  TempSrc: Oral  PainSc: 0-No pain   Pain Goal:                 CARVER,ALISON L

## 2017-01-18 NOTE — Lactation Note (Signed)
This note was copied from a baby's chart. Lactation Consultation Note: experienced BF mom reports baby has been nursing well- just finished nursing about 30 min ago- is asleep in bassinet. LS 9 by RN. Mom requests pump- manual pump given. Reviewed setup. use and cleaning of pump pieces. Mom pumped and transitional milk obtained. Encouraged to always breast feed first to promote milk production. No questions at present. To call for assist prn  Patient Name: Robin Nguyen ZOXWR'U Date: 01/18/2017 Reason for consult: Follow-up assessment   Maternal Data Formula Feeding for Exclusion: Yes Reason for exclusion: Mother's choice to formula and breast feed on admission Has patient been taught Hand Expression?: Yes Does the patient have breastfeeding experience prior to this delivery?: Yes  Feeding Nipple Type: Slow - flow  LATCH Score/Interventions                      Lactation Tools Discussed/Used Pump Review: Setup, frequency, and cleaning Initiated by:: DW Date initiated:: 01/18/17   Consult Status Consult Status: PRN    Pamelia Hoit 01/18/2017, 12:45 PM

## 2017-01-18 NOTE — Addendum Note (Signed)
Addendum  created 01/18/17 0730 by Yolonda Kida, CRNA   Sign clinical note

## 2017-01-18 NOTE — Progress Notes (Signed)
MOB was referred for history of depression/anxiety. * Referral screened out by Clinical Social Worker because none of the following criteria appear to apply: ~ History of anxiety/depression during this pregnancy, or of post-partum depression. ~ Diagnosis of anxiety and/or depression within last 3 years OR * MOB's symptoms currently being treated with medication and/or therapy. Please contact the Clinical Social Worker if needs arise, or if MOB requests.   

## 2017-01-18 NOTE — Progress Notes (Signed)
Postoperative Note Day # 1  S:  Patient resting comfortable in bed.  Pain controlled.  Tolerating general diet. No flatus, no BM.  Lochia moderate.  Ambulating without difficulty.  She denies n/v/f/c, SOB, or CP.  Pt breastfeeding.  O: Temp:  [97.4 F (36.3 C)-98.1 F (36.7 C)] 97.5 F (36.4 C) (04/05 0643) Pulse Rate:  [50-75] 72 (04/05 0643) Resp:  [16-18] 18 (04/05 0643) BP: (91-123)/(50-71) 109/69 (04/05 0643) SpO2:  [99 %-100 %] 100 % (04/05 0643) Weight:  [141 lb (64 kg)] 141 lb (64 kg) (04/04 1120)   Gen: A&Ox3, NAD CV: RRR Resp: CTAB Abdomen: soft, NT, ND BS quiet Uterus: firm, non-tender, below umbilicus Incision: honeycomb dressing with old blood noted- no active bleeding appreciated Ext: No edema, no calf tenderness bilaterally  Labs:  CBC Latest Ref Rng & Units 01/18/2017 01/17/2017 01/16/2017  WBC 4.0 - 10.5 K/uL 6.9 6.1 6.4  Hemoglobin 12.0 - 15.0 g/dL 16.1 10.9(L) 11.9(L)  Hematocrit 36.0 - 46.0 % 34.7(L) 30.9(L) 34.6(L)  Platelets 150 - 400 K/uL 99(L) 96(L) 104(L)    A/P: Pt is a 26 y.o. [id number] s/p Repeat C-section and tubal ligation, POD#1  - Pain well controlled -GU: UOP is adequate -GI: Tolerating general diet -Activity: encouraged sitting up to chair and ambulation as tolerated -Prophylaxis: early ambulation -Gestational thrombocytopenia: stable as above -Baby boy circ to be completed as outpatient -h/o Depression and postpartum Depression- no medication currently, pt to be seen by social work  DISPO: Continue with routine postpartum care  Myna Hidalgo, [place] [phone] (pager) [phone] (office)

## 2017-01-19 NOTE — Lactation Note (Signed)
This note was copied from a baby's chart. Lactation Consultation Note  Patient Name: Robin Nguyen ZOXWR'U Date: 01/19/2017  Mom states feedings are going well and breasts are becoming fuller.  No questions/concerns at this time.  Reviewed lactation outpt services and support and encouraged to call prn.   Maternal Data    Feeding    LATCH Score/Interventions                      Lactation Tools Discussed/Used     Consult Status      [name] 01/19/2017, 10:49 AM

## 2017-01-19 NOTE — Progress Notes (Signed)
Postoperative Note Day # 2  S:  Patient resting comfortable in bed.  Pain controlled.  Tolerating general diet. + flatus, + BM.  Lochia moderate.  Ambulating without difficulty.  She denies n/v/f/c, SOB, or CP.  Pt breastfeeding.  O: Temp:  [97.3 F (36.3 C)-98.2 F (36.8 C)] 98.2 F (36.8 C) (04/06 0628) Pulse Rate:  [63-69] 64 (04/06 0628) Resp:  [18] 18 (04/06 0628) BP: (104-109)/(66-76) 109/66 (04/06 0628) SpO2:  [100 %] 100 % (04/05 1142)   Gen: A&Ox3, NAD CV: RRR Resp: CTAB Abdomen: soft, NT, ND +BS Uterus: firm, non-tender, below umbilicus Incision: honeycomb dressing with old blood noted-unchanged from yesterday Ext: No edema, no calf tenderness bilaterally  Labs:  CBC Latest Ref Rng & Units 01/18/2017 01/17/2017 01/16/2017  WBC 4.0 - 10.5 K/uL 6.9 6.1 6.4  Hemoglobin 12.0 - 15.0 g/dL 29.5 10.9(L) 11.9(L)  Hematocrit 36.0 - 46.0 % 34.7(L) 30.9(L) 34.6(L)  Platelets 150 - 400 K/uL 99(L) 96(L) 104(L)    A/P: Pt is a 26 y.o. [id number] s/p Repeat C-section and tubal ligation, POD#2  - Pain well controlled -GU: Voiding freely -GI: Tolerating general diet -Activity: encouraged sitting up to chair and ambulation as tolerated -Prophylaxis: early ambulation -Gestational thrombocytopenia: stable as above -Baby boy circ to be completed as outpatient -h/o Depression and postpartum Depression- no medication currently, mood appropriate Plan for 2wk postoperative follow up  DISPO: Meeting postop milestones appropriately, plan for discharge home today  Myna Hidalgo, DO [phone] (pager) [phone] (office)

## 2017-01-20 NOTE — Discharge Summary (Signed)
OB Discharge Summary     Patient Name: Robin Nguyen DOB: 1991-04-19 MRN: [id number]  Date of admission: 01/17/2017 Delivering MD: Myna Hidalgo   Date of discharge: 01/19/2017  Admitting diagnosis: Z98.891 H-O Cesarean Section Intrauterine pregnancy: [redacted]w[redacted]d     Secondary diagnosis:  Active Problems:   Normal intrauterine pregnancy in third trimester  Additional problems: Chronic Hepatitis B, h/o HSV, h/o Depression     Discharge diagnosis: Term Pregnancy Delivered                                                                                                Post partum procedures: tubal ligation  Augmentation: N/A  Complications: None  Hospital course:  Sceduled C/S   26 y.o. yo G2P1001 at [redacted]w[redacted]d was admitted to the hospital 01/17/2017 for scheduled cesarean section with the following indication:Elective Repeat.  Membrane Rupture Time/Date: 12:57 PM ,01/17/2017   Patient delivered a Viable infant.01/17/2017  Details of operation can be found in separate operative note.  Pateint had an uncomplicated postpartum course.  She is ambulating, tolerating a regular diet, passing flatus, and urinating well. Patient is discharged home in stable condition on  01/20/17         Physical exam  Vitals:   01/18/17 0643 01/18/17 1142 01/18/17 1842 01/19/17 0628  BP: 109/69 104/68 108/76 109/66  Pulse: 72 63 69 64  Resp: Temp: 97.5 F (36.4 C) 97.3 F (36.3 C) 98 F (36.7 C) 98.2 F (36.8 C)  TempSrc: Oral Oral Oral Oral  SpO2: 100% 100%    Weight:      Height:       General: alert, cooperative and no distress Lochia: appropriate Uterine Fundus: firm Incision: clean/dry/intact DVT Evaluation: No evidence of DVT seen on physical exam. Labs: Lab Results  Component Value Date   WBC 6.9 01/18/2017   HGB 12.0 01/18/2017   HCT 34.7 (L) 01/18/2017   MCV 85.9 01/18/2017   PLT 99 (L) 01/18/2017   CMP Latest Ref Rng & Units 08/08/2015  Glucose 65 - 99 mg/dL 88  BUN 6 - 20  mg/dL 9  Creatinine 4.09 - 8.11 mg/dL 9.14  Sodium 782 - 956 mmol/L 137  Potassium 3.5 - 5.1 mmol/L 3.7  Chloride 101 - 111 mmol/L 108  CO2 22 - 32 mmol/L 24  Calcium 8.9 - 10.3 mg/dL 9.5  Total Protein 6.5 - 8.1 g/dL -  Total Bilirubin 0.3 - 1.2 mg/dL -  Alkaline Phos 38 - 213 U/L -  AST 15 - 41 U/L -  ALT 14 - 54 U/L -    Discharge instruction: per After Visit Summary and "Baby and Me Booklet".  After visit meds:  Allergies as of 01/19/2017   No Known Allergies     Medication List    TAKE these medications   docusate sodium 100 MG capsule Commonly known as:  COLACE Take 1 capsule (100 mg total) by mouth 2 (two) times daily.   ibuprofen 600 MG tablet Commonly known as:  ADVIL,MOTRIN Take 1 tablet (600 mg total) by mouth every 6 (six) hours.  oxyCODONE 5 MG immediate release tablet Commonly known as:  Oxy IR/ROXICODONE Take 1 tablet (5 mg total) by mouth every 6 (six) hours as needed (pain scale 4-7).   prenatal vitamin w/FE, FA 29-1 MG Chew chewable tablet CHEW ONE TABLET BY MOUTH ONCE DAILY AT 12 NOON What changed:  See the new instructions.       Diet: routine diet  Activity: Advance as tolerated. Pelvic rest for 6 weeks.   Outpatient follow up:2 weeks Follow up Appt:No future appointments. Follow up Visit:No Follow-up on file.  Postpartum contraception: Tubal Ligation  Newborn Data: Live born female  Birth Weight: 7 lb 5.8 oz (3340 g) APGAR: 8, 9  Baby Feeding: Breast Disposition:home with mother   01/20/2017 Myna Hidalgo, M, DO

## 2017-02-28 ENCOUNTER — Other Ambulatory Visit: Payer: Self-pay | Admitting: Obstetrics & Gynecology

## 2017-02-28 ENCOUNTER — Other Ambulatory Visit (HOSPITAL_COMMUNITY)
Admission: RE | Admit: 2017-02-28 | Discharge: 2017-02-28 | Disposition: A | Discharge status: Home / Self Care | Payer: Medicaid Other | Source: Ambulatory Visit | Attending: Obstetrics and Gynecology | Admitting: Obstetrics and Gynecology

## 2017-02-28 DIAGNOSIS — Z01419 Encounter for gynecological examination (general) (routine) without abnormal findings: Secondary | ICD-10-CM | POA: Diagnosis not present

## 2017-03-01 LAB — CYTOLOGY - PAP: Diagnosis: NEGATIVE

## 2017-03-23 NOTE — Addendum Note (Signed)
Addendum  created 03/23/17 1138 by Leilani AbleHatchett, Arbie Reisz, MD   Sign clinical note

## 2017-04-24 ENCOUNTER — Encounter (HOSPITAL_COMMUNITY): Payer: Self-pay

## 2019-04-17 ENCOUNTER — Other Ambulatory Visit: Payer: Self-pay

## 2019-04-17 ENCOUNTER — Encounter (HOSPITAL_COMMUNITY): Payer: Self-pay | Admitting: Emergency Medicine

## 2019-04-17 ENCOUNTER — Telehealth: Payer: Self-pay | Admitting: *Deleted

## 2019-04-17 ENCOUNTER — Ambulatory Visit (HOSPITAL_COMMUNITY)
Admission: EM | Admit: 2019-04-17 | Discharge: 2019-04-17 | Disposition: A | Discharge status: Home / Self Care | Payer: Medicaid Other | Attending: Family Medicine | Admitting: Family Medicine

## 2019-04-17 DIAGNOSIS — R51 Headache: Secondary | ICD-10-CM | POA: Diagnosis not present

## 2019-04-17 DIAGNOSIS — Z20828 Contact with and (suspected) exposure to other viral communicable diseases: Secondary | ICD-10-CM

## 2019-04-17 DIAGNOSIS — R509 Fever, unspecified: Secondary | ICD-10-CM | POA: Diagnosis not present

## 2019-04-17 DIAGNOSIS — R5383 Other fatigue: Secondary | ICD-10-CM

## 2019-04-17 DIAGNOSIS — Z20822 Contact with and (suspected) exposure to covid-19: Secondary | ICD-10-CM

## 2019-04-17 DIAGNOSIS — R05 Cough: Secondary | ICD-10-CM

## 2019-04-17 MED ORDER — ACETAMINOPHEN 500 MG PO TABS: 500.0000 mg | ORAL_TABLET | Freq: Four times a day (QID) | ORAL | 0 refills | Status: DC | PRN

## 2019-04-17 MED ORDER — BENZONATATE 200 MG PO CAPS: 200.0000 mg | ORAL_CAPSULE | Freq: Two times a day (BID) | ORAL | 0 refills | Status: DC | PRN

## 2019-04-17 NOTE — Discharge Instructions (Signed)
Home, to rest Push fluids Take acetaminophen (Tylenol) for pain or fever Take Tessalon as needed for cough You must stay home and quarantine until your COVID-19 test is available     Person Under Monitoring Name: Robin Nguyen  [place] [address] [place] [id number]   Infection Prevention Recommendations for Individuals Confirmed to have, or Being Evaluated for, 2019 Novel Coronavirus (COVID-19) Infection Who Receive Care at Home  Individuals who are confirmed to have, or are being evaluated for, COVID-19 should follow the prevention steps below until a healthcare provider or local or state health department says they can return to normal activities.  Stay home except to get medical care You should restrict activities outside your home, except for getting medical care. Do not go to work, school, or public areas, and do not use public transportation or taxis.  Call ahead before visiting your doctor Before your medical appointment, call the healthcare provider and tell them that you have, or are being evaluated for, COVID-19 infection. This will help the healthcare providers office take steps to keep other people from getting infected. Ask your healthcare provider to call the local or state health department.  Monitor your symptoms Seek prompt medical attention if your illness is worsening (e.g., difficulty breathing). Before going to your medical appointment, call the healthcare provider and tell them that you have, or are being evaluated for, COVID-19 infection. Ask your healthcare provider to call the local or state health department.  Wear a facemask You should wear a facemask that covers your nose and mouth when you are in the same room with other people and when you visit a healthcare provider. People who live with or visit you should also wear a facemask while they are in the same room with you.  Separate yourself from other people in your home As much  as possible, you should stay in a different room from other people in your home. Also, you should use a separate bathroom, if available.  Avoid sharing household items You should not share dishes, drinking glasses, cups, eating utensils, towels, bedding, or other items with other people in your home. After using these items, you should wash them thoroughly with soap and water.  Cover your coughs and sneezes Cover your mouth and nose with a tissue when you cough or sneeze, or you can cough or sneeze into your sleeve. Throw used tissues in a lined trash can, and immediately wash your hands with soap and water for at least 20 seconds or use an alcohol-based hand rub.  Wash your [employer]hands Wash your hands often and thoroughly with soap and water for at least 20 seconds. You can use an alcohol-based hand sanitizer if soap and water are not available and if your hands are not visibly dirty. Avoid touching your eyes, nose, and mouth with unwashed hands.   Prevention Steps for Caregivers and Household Members of Individuals Confirmed to have, or Being Evaluated for, COVID-19 Infection Being Cared for in the Home  If you live with, or provide care at home for, a person confirmed to have, or being evaluated for, COVID-19 infection please follow these guidelines to prevent infection:  Follow healthcare providers instructions Make sure that you understand and can help the patient follow any healthcare provider instructions for all care.  Provide for the patients basic needs You should help the patient with basic needs in the home and provide support for getting groceries, prescriptions, and other personal needs.  Monitor the patients  symptoms If they are getting sicker, call his or her medical provider and tell them that the patient has, or is being evaluated for, COVID-19 infection. This will help the healthcare providers office take steps to keep other people from getting infected. Ask the  healthcare provider to call the local or state health department.  Limit the number of people who have contact with the patient If possible, have only one caregiver for the patient. Other household members should stay in another home or place of residence. If this is not possible, they should stay in another room, or be separated from the patient as much as possible. Use a separate bathroom, if available. Restrict visitors who do not have an essential need to be in the home.  Keep older adults, very young children, and other sick people away from the patient Keep older adults, very young children, and those who have compromised immune systems or chronic health conditions away from the patient. This includes people with chronic heart, lung, or kidney conditions, diabetes, and cancer.  Ensure good ventilation Make sure that shared spaces in the home have good air flow, such as from an air conditioner or an opened window, weather permitting.  Wash your hands often Wash your hands often and thoroughly with soap and water for at least 20 seconds. You can use an alcohol based hand sanitizer if soap and water are not available and if your hands are not visibly dirty. Avoid touching your eyes, nose, and mouth with unwashed hands. Use disposable paper towels to dry your hands. If not available, use dedicated cloth towels and replace them when they become wet.  Wear a facemask and gloves Wear a disposable facemask at all times in the room and gloves when you touch or have contact with the patients blood, body fluids, and/or secretions or excretions, such as sweat, saliva, sputum, nasal mucus, vomit, urine, or feces.  Ensure the mask fits over your nose and mouth tightly, and do not touch it during use. Throw out disposable facemasks and gloves after using them. Do not reuse. Wash your hands immediately after removing your facemask and gloves. If your personal clothing becomes contaminated, carefully  remove clothing and launder. Wash your hands after handling contaminated clothing. Place all used disposable facemasks, gloves, and other waste in a lined container before disposing them with other household waste. Remove gloves and wash your hands immediately after handling these items.  Do not share dishes, glasses, or other household items with the patient Avoid sharing household items. You should not share dishes, drinking glasses, cups, eating utensils, towels, bedding, or other items with a patient who is confirmed to have, or being evaluated for, COVID-19 infection. After the person uses these items, you should wash them thoroughly with soap and water.  Wash laundry thoroughly Immediately remove and wash clothes or bedding that have blood, body fluids, and/or secretions or excretions, such as sweat, saliva, sputum, nasal mucus, vomit, urine, or feces, on them. Wear gloves when handling laundry from the patient. Read and follow directions on labels of laundry or clothing items and detergent. In general, wash and dry with the warmest temperatures recommended on the label.  Clean all areas the individual has used often Clean all touchable surfaces, such as counters, tabletops, doorknobs, bathroom fixtures, toilets, phones, keyboards, tablets, and bedside tables, every day. Also, clean any surfaces that may have blood, body fluids, and/or secretions or excretions on them. Wear gloves when cleaning surfaces the patient has come in contact  with. Use a diluted bleach solution (e.g., dilute bleach with 1 part bleach and 10 parts water) or a household disinfectant with a label that says EPA-registered for coronaviruses. To make a bleach solution at home, add 1 tablespoon of bleach to 1 quart (4 cups) of water. For a larger supply, add  cup of bleach to 1 gallon (16 cups) of water. Read labels of cleaning products and follow recommendations provided on product labels. Labels contain instructions for  safe and effective use of the cleaning product including precautions you should take when applying the product, such as wearing gloves or eye protection and making sure you have good ventilation during use of the product. Remove gloves and wash hands immediately after cleaning.  Monitor yourself for signs and symptoms of illness Caregivers and household members are considered close contacts, should monitor their health, and will be asked to limit movement outside of the home to the extent possible. Follow the monitoring steps for close contacts listed on the symptom monitoring form.   ? If you have additional questions, contact your local health department or call the epidemiologist on call at [phone] (available 24/7). ? This guidance is subject to change. For the most up-to-date guidance from [facility], please refer to their website: YouBlogs.pl

## 2019-04-17 NOTE — Telephone Encounter (Signed)
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

## 2019-04-17 NOTE — ED Provider Notes (Signed)
[facility]    CSN: [id number] Arrival date & time: 04/17/19  0944      History   Chief Complaint Chief Complaint  Patient presents with   Headache    HPI Robin Nguyen is a 28 y.o. female.   HPI  Patient has headache, body aches, some fatigue, fever for 2 days.  She is also has a cough that is harsh and makes her chest burn.  She does not feel short of breath.  She works in a nursing home.  She is concerned about COVID-19.  Mild sore throat.  No nausea or vomiting.  No skin rash.  No known exposure to COVID-19.  She did have a patient that was coughing at the nursing home last week and died.  She does not think the patient got COVID-19 testing.  Past Medical History:  Diagnosis Date   Depression    Gestational thrombocytopenia (HCC)    Headache    Hepatitis B    HSV (herpes simplex virus) anogenital infection    Migraine    Placenta previa 01/10/2015    Patient Active Problem List   Diagnosis Date Noted   Normal intrauterine pregnancy in third trimester 01/17/2017   Chronic migraine 08/09/2016   Depression, major, single episode, moderate (HCC) 05/29/2015   Status post primary low transverse cesarean section 02/04/2015   Low lying placenta without hemorrhage, antepartum 02/02/2015   IUGR (intrauterine growth restriction) 02/02/2015   Gestational thrombocytopenia without hemorrhage (HCC) 02/02/2015   Hepatitis B infection 05/01/2014    Past Surgical History:  Procedure Laterality Date   CESAREAN SECTION N/A 02/03/2015   Procedure: CESAREAN SECTION;  Surgeon: Myna HidalgoJennifer Ozan, DO;  Location: [facility];  Service: Obstetrics;  Laterality: N/A;   CESAREAN SECTION WITH BILATERAL TUBAL LIGATION N/A 01/17/2017   Procedure: CESAREAN SECTION WITH BILATERAL TUBAL LIGATION;  Surgeon: Myna HidalgoJennifer Ozan, DO;  Location: [facility];  Service: Obstetrics;  Laterality: N/A;  EDD 01/24/17    OB History    Gravida  2   Para  1   Term  1   Preterm        AB      Living  1     SAB      TAB      Ectopic      Multiple  0   Live Births  1            Home Medications    Prior to Admission medications   Medication Sig Start Date End Date Taking? Authorizing Provider  acetaminophen (TYLENOL) 500 MG tablet Take 1 tablet (500 mg total) by mouth every 6 (six) hours as needed. 04/17/19   Eustace MooreNelson, Vannessa Godown Sue, MD  benzonatate (TESSALON) 200 MG capsule Take 1 capsule (200 mg total) by mouth 2 (two) times daily as needed for cough. 04/17/19   Eustace MooreNelson, Janith Nielson Sue, MD    Family History Family History  Problem Relation Age of Onset   Hypertension Mother    Healthy Father     Social History Social History   Tobacco Use   Smoking status: Never Smoker   Smokeless tobacco: Never Used  Substance Use Topics   Alcohol use: No   Drug use: No     Allergies   Patient has no known allergies.   Review of Systems Review of Systems  Constitutional: Positive for fatigue and fever. Negative for chills.  HENT: Negative for ear pain and sore throat.   Eyes: Negative for pain and visual  disturbance.  Respiratory: Positive for cough. Negative for shortness of breath.   Cardiovascular: Negative for chest pain and palpitations.  Gastrointestinal: Negative for abdominal pain and vomiting.  Genitourinary: Negative for dysuria and hematuria.  Musculoskeletal: Positive for myalgias. Negative for arthralgias and back pain.  Skin: Negative for color change and rash.  Neurological: Positive for headaches. Negative for seizures and syncope.  All other systems reviewed and are negative.    Physical Exam Triage Vital Signs ED Triage Vitals  Enc Vitals Group     BP 04/17/19 1036 129/78     Pulse Rate 04/17/19 1036 (!) 124     Resp 04/17/19 1036 20     Temp 04/17/19 1036 99.4 F (37.4 C)     Temp Source 04/17/19 1036 Temporal     SpO2 04/17/19 1036 100 %     Weight --      Height --      Head Circumference --      Peak Flow --       Pain Score 04/17/19 1031 10     Pain Loc --      Pain Edu? --      Excl. in GC? --    No data found.  Updated Vital Signs BP 129/78 (BP Location: Right Arm)    Pulse (!) 124    Temp 99.4 F (37.4 C) (Temporal)    Resp 20    SpO2 100%     Physical Exam Constitutional:      General: She is not in acute distress.    Appearance: She is well-developed and normal weight.     Comments: Appears tired  HENT:     Head: Normocephalic and atraumatic.     Mouth/Throat:     Mouth: Mucous membranes are moist.  Eyes:     Conjunctiva/sclera: Conjunctivae normal.     Pupils: Pupils are equal, round, and reactive to light.  Neck:     Musculoskeletal: Normal range of motion and neck supple.  Cardiovascular:     Rate and Rhythm: Normal rate and regular rhythm.     Heart sounds: Normal heart sounds.  Pulmonary:     Effort: Pulmonary effort is normal. No respiratory distress.     Breath sounds: Normal breath sounds.  Abdominal:     General: Bowel sounds are normal. There is no distension.     Palpations: Abdomen is soft.  Musculoskeletal: Normal range of motion.  Lymphadenopathy:     Cervical: Cervical adenopathy present.  Skin:    General: Skin is warm and dry.  Neurological:     Mental Status: She is alert.  Psychiatric:        Mood and Affect: Mood normal.        Behavior: Behavior normal.      UC Treatments / Results  Labs (all labs ordered are listed, but only abnormal results are displayed) Labs Reviewed - No data to display  EKG   Radiology No results found.  Procedures Procedures (including critical care time)  Medications Ordered in UC Medications - No data to display  Initial Impression / Assessment and Plan / UC Course  I have reviewed the triage vital signs and the nursing notes.  Pertinent labs & imaging results that were available during my care of the patient were reviewed by me and considered in my medical decision making (see chart for details).      Discussed quarantine at home  Final Clinical Impressions(s) / UC Diagnoses   Final  diagnoses:  Suspected Covid-19 Virus Infection     Discharge Instructions     Home, to rest Push fluids Take acetaminophen (Tylenol) for pain or fever Take Tessalon as needed for cough You must stay home and quarantine until your COVID-19 test is available     Person Under Monitoring Name: Robin Nguyen  [place] [address] [place] [id number]   Infection Prevention Recommendations for Individuals Confirmed to have, or Being Evaluated for, 2019 Novel Coronavirus (COVID-19) Infection Who Receive Care at Home  Individuals who are confirmed to have, or are being evaluated for, COVID-19 should follow the prevention steps below until a healthcare provider or local or state health department says they can return to normal activities.  Stay home except to get medical care You should restrict activities outside your home, except for getting medical care. Do not go to work, school, or public areas, and do not use public transportation or taxis.  Call ahead before visiting your doctor Before your medical appointment, call the healthcare provider and tell them that you have, or are being evaluated for, COVID-19 infection. This will help the healthcare providers office take steps to keep other people from getting infected. Ask your healthcare provider to call the local or state health department.  Monitor your symptoms Seek prompt medical attention if your illness is worsening (e.g., difficulty breathing). Before going to your medical appointment, call the healthcare provider and tell them that you have, or are being evaluated for, COVID-19 infection. Ask your healthcare provider to call the local or state health department.  Wear a facemask You should wear a facemask that covers your nose and mouth when you are in the same room with other people and when you visit a healthcare  provider. People who live with or visit you should also wear a facemask while they are in the same room with you.  Separate yourself from other people in your home As much as possible, you should stay in a different room from other people in your home. Also, you should use a separate bathroom, if available.  Avoid sharing household items You should not share dishes, drinking glasses, cups, eating utensils, towels, bedding, or other items with other people in your home. After using these items, you should wash them thoroughly with soap and water.  Cover your coughs and sneezes Cover your mouth and nose with a tissue when you cough or sneeze, or you can cough or sneeze into your sleeve. Throw used tissues in a lined trash can, and immediately wash your hands with soap and water for at least 20 seconds or use an alcohol-based hand rub.  Wash your [employer]hands Wash your hands often and thoroughly with soap and water for at least 20 seconds. You can use an alcohol-based hand sanitizer if soap and water are not available and if your hands are not visibly dirty. Avoid touching your eyes, nose, and mouth with unwashed hands.   Prevention Steps for Caregivers and Household Members of Individuals Confirmed to have, or Being Evaluated for, COVID-19 Infection Being Cared for in the Home  If you live with, or provide care at home for, a person confirmed to have, or being evaluated for, COVID-19 infection please follow these guidelines to prevent infection:  Follow healthcare providers instructions Make sure that you understand and can help the patient follow any healthcare provider instructions for all care.  Provide for the patients basic needs You should help the patient with basic needs in the  home and provide support for getting groceries, prescriptions, and other personal needs.  Monitor the patients symptoms If they are getting sicker, call his or her medical provider and tell them that the  patient has, or is being evaluated for, COVID-19 infection. This will help the healthcare providers office take steps to keep other people from getting infected. Ask the healthcare provider to call the local or state health department.  Limit the number of people who have contact with the patient  If possible, have only one caregiver for the patient.  Other household members should stay in another home or place of residence. If this is not possible, they should stay  in another room, or be separated from the patient as much as possible. Use a separate bathroom, if available.  Restrict visitors who do not have an essential need to be in the home.  Keep older adults, very young children, and other sick people away from the patient Keep older adults, very young children, and those who have compromised immune systems or chronic health conditions away from the patient. This includes people with chronic heart, lung, or kidney conditions, diabetes, and cancer.  Ensure good ventilation Make sure that shared spaces in the home have good air flow, such as from an air conditioner or an opened window, weather permitting.  Wash your hands often  Wash your hands often and thoroughly with soap and water for at least 20 seconds. You can use an alcohol based hand sanitizer if soap and water are not available and if your hands are not visibly dirty.  Avoid touching your eyes, nose, and mouth with unwashed hands.  Use disposable paper towels to dry your hands. If not available, use dedicated cloth towels and replace them when they become wet.  Wear a facemask and gloves  Wear a disposable facemask at all times in the room and gloves when you touch or have contact with the patients blood, body fluids, and/or secretions or excretions, such as sweat, saliva, sputum, nasal mucus, vomit, urine, or feces.  Ensure the mask fits over your nose and mouth tightly, and do not touch it during use.  Throw out  disposable facemasks and gloves after using them. Do not reuse.  Wash your hands immediately after removing your facemask and gloves.  If your personal clothing becomes contaminated, carefully remove clothing and launder. Wash your hands after handling contaminated clothing.  Place all used disposable facemasks, gloves, and other waste in a lined container before disposing them with other household waste.  Remove gloves and wash your hands immediately after handling these items.  Do not share dishes, glasses, or other household items with the patient  Avoid sharing household items. You should not share dishes, drinking glasses, cups, eating utensils, towels, bedding, or other items with a patient who is confirmed to have, or being evaluated for, COVID-19 infection.  After the person uses these items, you should wash them thoroughly with soap and water.  Wash laundry thoroughly  Immediately remove and wash clothes or bedding that have blood, body fluids, and/or secretions or excretions, such as sweat, saliva, sputum, nasal mucus, vomit, urine, or feces, on them.  Wear gloves when handling laundry from the patient.  Read and follow directions on labels of laundry or clothing items and detergent. In general, wash and dry with the warmest temperatures recommended on the label.  Clean all areas the individual has used often  Clean all touchable surfaces, such as counters, tabletops, doorknobs, bathroom fixtures, toilets,  phones, keyboards, tablets, and bedside tables, every day. Also, clean any surfaces that may have blood, body fluids, and/or secretions or excretions on them.  Wear gloves when cleaning surfaces the patient has come in contact with.  Use a diluted bleach solution (e.g., dilute bleach with 1 part bleach and 10 parts water) or a household disinfectant with a label that says EPA-registered for coronaviruses. To make a bleach solution at home, add 1 tablespoon of bleach to 1  quart (4 cups) of water. For a larger supply, add  cup of bleach to 1 gallon (16 cups) of water.  Read labels of cleaning products and follow recommendations provided on product labels. Labels contain instructions for safe and effective use of the cleaning product including precautions you should take when applying the product, such as wearing gloves or eye protection and making sure you have good ventilation during use of the product.  Remove gloves and wash hands immediately after cleaning.  Monitor yourself for signs and symptoms of illness Caregivers and household members are considered close contacts, should monitor their health, and will be asked to limit movement outside of the home to the extent possible. Follow the monitoring steps for close contacts listed on the symptom monitoring form.   ? If you have additional questions, contact your local health department or call the epidemiologist on call at [phone] (available 24/7). ? This guidance is subject to change. For the most up-to-date guidance from [facility], please refer to their website: TripMetro.huhttps://www.cdc.gov/coronavirus/2019-ncov/hcp/guidance-prevent-spread.html    ED Prescriptions    Medication Sig Dispense Auth. Provider   acetaminophen (TYLENOL) 500 MG tablet Take 1 tablet (500 mg total) by mouth every 6 (six) hours as needed. 30 tablet Eustace MooreNelson, Kainalu Heggs Sue, MD   benzonatate (TESSALON) 200 MG capsule Take 1 capsule (200 mg total) by mouth 2 (two) times daily as needed for cough. 20 capsule Eustace MooreNelson, Waylon Hershey Sue, MD     Controlled Substance Prescriptions [place] Controlled Substance Registry consulted? Not Applicable   Eustace MooreNelson, Aleana Fifita Sue, MD 04/17/19 2054

## 2019-04-17 NOTE — Telephone Encounter (Signed)
Pt scheduled for covid testing today @ [facility] @ 3:15. Instructions given and order placed

## 2019-04-17 NOTE — ED Triage Notes (Addendum)
Headache, sore throat, poor appetite.  Unknown temperature feverish to patient Patient has a cough and this makes chest burn  Patient works at [facility] [place]

## 2019-04-23 LAB — NOVEL CORONAVIRUS, NAA: SARS-CoV-2, NAA: DETECTED — AB

## 2019-08-21 ENCOUNTER — Emergency Department (HOSPITAL_COMMUNITY)
Admission: EM | Admit: 2019-08-21 | Discharge: 2019-08-21 | Disposition: A | Discharge status: Home / Self Care | Payer: Medicaid Other | Attending: Emergency Medicine | Admitting: Emergency Medicine

## 2019-08-21 ENCOUNTER — Other Ambulatory Visit: Payer: Self-pay

## 2019-08-21 ENCOUNTER — Encounter (HOSPITAL_COMMUNITY): Payer: Self-pay | Admitting: Emergency Medicine

## 2019-08-21 ENCOUNTER — Emergency Department (HOSPITAL_COMMUNITY): Payer: Medicaid Other

## 2019-08-21 DIAGNOSIS — R519 Headache, unspecified: Secondary | ICD-10-CM

## 2019-08-21 LAB — CBC WITH DIFFERENTIAL/PLATELET
Abs Immature Granulocytes: 0 10*3/uL (ref 0.00–0.07)
Basophils Absolute: 0.2 10*3/uL — ABNORMAL HIGH (ref 0.0–0.1)
Basophils Relative: 2 %
Eosinophils Absolute: 0.5 10*3/uL (ref 0.0–0.5)
Eosinophils Relative: 7 %
HCT: 40 % (ref 36.0–46.0)
Hemoglobin: 13.5 g/dL (ref 12.0–15.0)
Lymphocytes Relative: 47 %
Lymphs Abs: 3.6 10*3/uL (ref 0.7–4.0)
MCH: 28.9 pg (ref 26.0–34.0)
MCHC: 33.8 g/dL (ref 30.0–36.0)
MCV: 85.7 fL (ref 80.0–100.0)
Monocytes Absolute: 0.7 10*3/uL (ref 0.1–1.0)
Monocytes Relative: 9 %
Neutro Abs: 2.7 10*3/uL (ref 1.7–7.7)
Neutrophils Relative %: 35 %
Platelets: 177 10*3/uL (ref 150–400)
RBC: 4.67 MIL/uL (ref 3.87–5.11)
RDW: 13.4 % (ref 11.5–15.5)
WBC: 7.6 10*3/uL (ref 4.0–10.5)
nRBC: 0 % (ref 0.0–0.2)
nRBC: 0 /100 WBC

## 2019-08-21 LAB — COMPREHENSIVE METABOLIC PANEL
ALT: 32 U/L (ref 0–44)
AST: 24 U/L (ref 15–41)
Albumin: 3.8 g/dL (ref 3.5–5.0)
Alkaline Phosphatase: 47 U/L (ref 38–126)
Anion gap: 10 (ref 5–15)
BUN: 15 mg/dL (ref 6–20)
CO2: 22 mmol/L (ref 22–32)
Calcium: 9.7 mg/dL (ref 8.9–10.3)
Chloride: 105 mmol/L (ref 98–111)
Creatinine, Ser: 0.9 mg/dL (ref 0.44–1.00)
GFR calc Af Amer: >60 mL/min (ref >60)
GFR calc non Af Amer: >60 mL/min (ref >60)
Glucose, Bld: 91 mg/dL (ref 70–99)
Potassium: 4.1 mmol/L (ref 3.5–5.1)
Sodium: 137 mmol/L (ref 135–145)
Total Bilirubin: 0.3 mg/dL (ref 0.3–1.2)
Total Protein: 8.2 g/dL — ABNORMAL HIGH (ref 6.5–8.1)

## 2019-08-21 LAB — I-STAT BETA HCG BLOOD, ED (MC, WL, AP ONLY): I-stat hCG, quantitative: <5 m[IU]/mL (ref <5)

## 2019-08-21 MED ORDER — DIPHENHYDRAMINE HCL 50 MG/ML IJ SOLN
12.5000 mg | Freq: Once | INTRAMUSCULAR | Status: AC
  Administered 2019-08-21: 22:00:00 12.5 mg via INTRAVENOUS
  Filled 2019-08-21: qty 1

## 2019-08-21 MED ORDER — KETOROLAC TROMETHAMINE 15 MG/ML IJ SOLN
15.0000 mg | Freq: Once | INTRAMUSCULAR | Status: AC
  Administered 2019-08-21: 22:00:00 15 mg via INTRAVENOUS
  Filled 2019-08-21: qty 1

## 2019-08-21 MED ORDER — METOCLOPRAMIDE HCL 5 MG/ML IJ SOLN
10.0000 mg | Freq: Once | INTRAMUSCULAR | Status: AC
  Administered 2019-08-21: 22:00:00 10 mg via INTRAVENOUS
  Filled 2019-08-21: qty 2

## 2019-08-21 NOTE — ED Triage Notes (Signed)
Pt here with c/o chills and h/a , no fever . Pt was positive for covid 4 months ago , has not been around with symptoms

## 2019-08-21 NOTE — Discharge Instructions (Addendum)
Please review the attachment on recurrent migraine headaches.  Please take ibuprofen or Excedrin over-the-counter for symptomatic relief of your next migraine headache to see if that helps.  Please return to the ED or seek medical attention should you develop any fevers, neck stiffness, visual changes, weakness or numbness, chest pain or shortness of breath, or any other new or worsening symptoms.

## 2019-08-21 NOTE — ED Provider Notes (Signed)
[facility] EMERGENCY DEPARTMENT Provider Note   CSN: [id number] Arrival date & time: 08/21/19  1824     History   Chief Complaint No chief complaint on file.   HPI Robin Nguyen is a 28 y.o. female with past medical history significant for chronic migraines.  She reports that her OB/GYN is her primary care provider and that she has previously been sent to neurology for injections for her migraines.  She reports approximately 2-4 episodes per month.  She denies any aura or visual deficits.  Patient states that her headache today is similar to her previous headaches, but she also developed chills which prompted her to come into the ED for further evaluation.  She states that her headache is a 7 out of 10, throbbing, "all over" headache that has persisted for the entire day.  She took a pregnancy test 2 days ago that was negative.  Her children are grown and she is no longer breast-feeding.  She denies any photophobia, nausea, vomiting, recent illness, rhinorrhea, cough, shortness of breath, chest pain, abdominal discomfort, fevers, urinary symptoms, change in bowel habits, visual deficits, numbness, tingling, or other neurologic symptoms.  She did not get her flu shot this year.     HPI  Past Medical History:  Diagnosis Date  . Depression   . Gestational thrombocytopenia ([place])   . Headache   . Hepatitis B   . HSV (herpes simplex virus) anogenital infection   . Migraine   . Placenta previa 01/10/2015    Patient Active Problem List   Diagnosis Date Noted  . Normal intrauterine pregnancy in third trimester 01/17/2017  . Chronic migraine 08/09/2016  . Depression, major, single episode, moderate ([place]) 05/29/2015  . Status post primary low transverse cesarean section 02/04/2015  . Low lying placenta without hemorrhage, antepartum 02/02/2015  . IUGR (intrauterine growth restriction) 02/02/2015  . Gestational thrombocytopenia without hemorrhage ([place]) 02/02/2015  .  Hepatitis B infection 05/01/2014    Past Surgical History:  Procedure Laterality Date  . CESAREAN SECTION N/A 02/03/2015   Procedure: CESAREAN SECTION;  Surgeon: Janyth Pupa, DO;  Location: [facility];  Service: Obstetrics;  Laterality: N/A;  . CESAREAN SECTION WITH BILATERAL TUBAL LIGATION N/A 01/17/2017   Procedure: CESAREAN SECTION WITH BILATERAL TUBAL LIGATION;  Surgeon: Janyth Pupa, DO;  Location: [facility];  Service: Obstetrics;  Laterality: N/A;  EDD 01/24/17     OB History    Gravida  2   Para  1   Term  1   Preterm      AB      Living  1     SAB      TAB      Ectopic      Multiple  0   Live Births  1            Home Medications    Prior to Admission medications   Medication Sig Start Date End Date Taking? Authorizing Provider  acetaminophen (TYLENOL) 500 MG tablet Take 1 tablet (500 mg total) by mouth every 6 (six) hours as needed. 04/17/19   Raylene Everts, MD  benzonatate (TESSALON) 200 MG capsule Take 1 capsule (200 mg total) by mouth 2 (two) times daily as needed for cough. 04/17/19   Raylene Everts, MD    Family History Family History  Problem Relation Age of Onset  . Hypertension Mother   . Healthy Father     Social History Social History   Tobacco Use  .  Smoking status: Never Smoker  . Smokeless tobacco: Never Used  Substance Use Topics  . Alcohol use: No  . Drug use: No     Allergies   Patient has no known allergies.   Review of Systems Review of Systems  All other systems reviewed and are negative.    Physical Exam Updated Vital Signs BP 108/72 (BP Location: Right Arm)   Pulse 73   Temp 98.2 F (36.8 C) (Oral)   Resp 16   LMP 08/14/2019   SpO2 100%   Physical Exam Vitals signs and nursing note reviewed. Exam conducted with a chaperone present.  Constitutional:      Appearance: Normal appearance.  HENT:     Head: Normocephalic and atraumatic.     Comments: Sinuses nontender to palpation.    Nose:  No congestion or rhinorrhea.  Eyes:     General: No scleral icterus.    Extraocular Movements: Extraocular movements intact.     Conjunctiva/sclera: Conjunctivae normal.     Pupils: Pupils are equal, round, and reactive to light.  Neck:     Musculoskeletal: Normal range of motion and neck supple.     Comments: No meningismus.  Nontender. Cardiovascular:     Rate and Rhythm: Normal rate and regular rhythm.     Pulses: Normal pulses.     Heart sounds: Normal heart sounds.  Pulmonary:     Effort: Pulmonary effort is normal.  Skin:    General: Skin is dry.  Neurological:     Mental Status: She is alert.     GCS: GCS eye subscore is 4. GCS verbal subscore is 5. GCS motor subscore is 6.     Comments: CN II through XII grossly intact.  No sensory deficits.  Negative Romberg and cerebellar exams.  Negative Brudzinski test.  Psychiatric:        Mood and Affect: Mood normal.        Behavior: Behavior normal.        Thought Content: Thought content normal.      ED Treatments / Results  Labs (all labs ordered are listed, but only abnormal results are displayed) Labs Reviewed  CBC WITH DIFFERENTIAL/PLATELET - Abnormal; Notable for the following components:      Result Value   Basophils Absolute 0.2 (*)    All other components within normal limits  COMPREHENSIVE METABOLIC PANEL - Abnormal; Notable for the following components:   Total Protein 8.2 (*)    All other components within normal limits  I-STAT BETA HCG BLOOD, ED ([facility], [facility], [facility] ONLY)    EKG None  Radiology Dg Chest 2 View  Result Date: 08/21/2019 CLINICAL DATA:  Chills and headache for 1 week, history of COVID-19 in July 2020 EXAM: CHEST - 2 VIEW COMPARISON:  Radiograph 07/08/2015 FINDINGS: No consolidation, features of edema, pneumothorax, or effusion. Pulmonary vascularity is normally distributed. The cardiomediastinal contours are unremarkable. No acute osseous or soft tissue abnormality. IMPRESSION: No acute  cardiopulmonary abnormality. Electronically Signed   By: Kreg Shropshire M.D.   On: 08/21/2019 20:38    Procedures Procedures (including critical care time)  Medications Ordered in ED Medications  ketorolac (TORADOL) 15 MG/ML injection 15 mg (15 mg Intravenous Given 08/21/19 2157)  metoCLOPramide (REGLAN) injection 10 mg (10 mg Intravenous Given 08/21/19 2157)  diphenhydrAMINE (BENADRYL) injection 12.5 mg (12.5 mg Intravenous Given 08/21/19 2157)     Initial Impression / Assessment and Plan / ED Course  I have reviewed the triage vital signs and the  nursing notes.  Pertinent labs & imaging results that were available during my care of the patient were reviewed by me and considered in my medical decision making (see chart for details).        Patient presented to the ED for pain control for her acute on chronic migraines.  She reports that her chills that she experienced are often affiliated with her migraines and she denies any recent illness or fever.  DG chest was obtained which demonstrates no evidence of acute cardiopulmonary disease.  No focal consolidation concerning for pneumonia.  Her CBC demonstrated no anemia or elevated white count concerning for infection.  Her CMP was also interpreted which demonstrates no electrolyte abnormalities or transaminitis.  Patient is in no acute distress on my exam and she does not feel particularly ill, but she would like something for headache.  She reports that she does not take medication at home and I advised her to take over-the-counter ibuprofen or Excedrin Migraine for headaches moving forward.  Ordered migraine cocktail consisting of Toradol, Compazine, and Benadryl for immediate relief here in the ED.   On reevaluation, patient's headache is almost entirely resolved.  She is no longer feeling any chills.  Her headache is now "1 out of 10".  She reports that she feels stable for discharge and will follow up with her OB/GYN regarding ongoing  evaluation and management of her chronic migraines.  Encouraged her to take ibuprofen or Excedrin over-the-counter for abortive therapy next time she experiences a migraine headache to see if that helps.  Please return to the ED or seek medical attention should you develop any fevers, neck stiffness, visual changes, weakness or numbness, chest pain or shortness of breath, or any other new or worsening symptoms.  Patient voiced understanding and is agreeable to plan.  Final Clinical Impressions(s) / ED Diagnoses   Final diagnoses:  Acute nonintractable headache, unspecified headache type    ED Discharge Orders    None       Elvera MariaGreen, Fernado Brigante L, PA-C 08/21/19 2227    Linwood DibblesKnapp, Jon, MD 08/22/19 1454

## 2020-04-15 DIAGNOSIS — Z419 Encounter for procedure for purposes other than remedying health state, unspecified: Secondary | ICD-10-CM | POA: Diagnosis not present

## 2020-05-16 DIAGNOSIS — Z419 Encounter for procedure for purposes other than remedying health state, unspecified: Secondary | ICD-10-CM | POA: Diagnosis not present

## 2020-06-03 DIAGNOSIS — N920 Excessive and frequent menstruation with regular cycle: Secondary | ICD-10-CM | POA: Diagnosis not present

## 2020-06-03 DIAGNOSIS — N946 Dysmenorrhea, unspecified: Secondary | ICD-10-CM | POA: Diagnosis not present

## 2020-06-03 DIAGNOSIS — N898 Other specified noninflammatory disorders of vagina: Secondary | ICD-10-CM | POA: Diagnosis not present

## 2020-06-16 DIAGNOSIS — Z419 Encounter for procedure for purposes other than remedying health state, unspecified: Secondary | ICD-10-CM | POA: Diagnosis not present

## 2020-07-05 ENCOUNTER — Encounter (HOSPITAL_COMMUNITY): Payer: Self-pay | Admitting: *Deleted

## 2020-07-05 ENCOUNTER — Other Ambulatory Visit: Payer: Self-pay

## 2020-07-05 ENCOUNTER — Emergency Department (HOSPITAL_COMMUNITY): Payer: Medicaid Other

## 2020-07-05 ENCOUNTER — Emergency Department (HOSPITAL_COMMUNITY)
Admission: EM | Admit: 2020-07-05 | Discharge: 2020-07-05 | Disposition: A | Discharge status: Home / Self Care | Payer: Medicaid Other | Source: Home / Self Care | Attending: Emergency Medicine | Admitting: Emergency Medicine

## 2020-07-05 ENCOUNTER — Emergency Department (HOSPITAL_COMMUNITY)
Admission: EM | Admit: 2020-07-05 | Discharge: 2020-07-05 | Disposition: A | Discharge status: Home / Self Care | Payer: Medicaid Other | Attending: Emergency Medicine | Admitting: Emergency Medicine

## 2020-07-05 DIAGNOSIS — S40012A Contusion of left shoulder, initial encounter: Secondary | ICD-10-CM | POA: Diagnosis not present

## 2020-07-05 DIAGNOSIS — Z743 Need for continuous supervision: Secondary | ICD-10-CM | POA: Diagnosis not present

## 2020-07-05 DIAGNOSIS — S46912A Strain of unspecified muscle, fascia and tendon at shoulder and upper arm level, left arm, initial encounter: Secondary | ICD-10-CM | POA: Insufficient documentation

## 2020-07-05 DIAGNOSIS — X58XXXA Exposure to other specified factors, initial encounter: Secondary | ICD-10-CM | POA: Insufficient documentation

## 2020-07-05 DIAGNOSIS — Y9389 Activity, other specified: Secondary | ICD-10-CM | POA: Insufficient documentation

## 2020-07-05 DIAGNOSIS — S4992XA Unspecified injury of left shoulder and upper arm, initial encounter: Secondary | ICD-10-CM | POA: Diagnosis present

## 2020-07-05 DIAGNOSIS — M542 Cervicalgia: Secondary | ICD-10-CM | POA: Insufficient documentation

## 2020-07-05 DIAGNOSIS — S40212A Abrasion of left shoulder, initial encounter: Secondary | ICD-10-CM | POA: Insufficient documentation

## 2020-07-05 DIAGNOSIS — M25519 Pain in unspecified shoulder: Secondary | ICD-10-CM | POA: Diagnosis not present

## 2020-07-05 DIAGNOSIS — M25512 Pain in left shoulder: Secondary | ICD-10-CM | POA: Diagnosis not present

## 2020-07-05 DIAGNOSIS — Y92410 Unspecified street and highway as the place of occurrence of the external cause: Secondary | ICD-10-CM | POA: Insufficient documentation

## 2020-07-05 MED ORDER — IBUPROFEN 200 MG PO TABS
600.0000 mg | ORAL_TABLET | Freq: Once | ORAL | Status: AC
  Administered 2020-07-05: 600 mg via ORAL
  Filled 2020-07-05: qty 3
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
## 2020-07-05 NOTE — Discharge Instructions (Signed)
Please return for any problem.   Follow up with your regular care provider as instructed.   Use motrin -- 600 mg every 8 hours -- for pain.
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
## 2020-07-05 NOTE — Discharge Instructions (Addendum)
Please return for any problem.  °
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
## 2020-07-07 ENCOUNTER — Other Ambulatory Visit: Payer: Self-pay

## 2020-07-07 ENCOUNTER — Encounter (HOSPITAL_COMMUNITY): Payer: Self-pay

## 2020-07-07 ENCOUNTER — Ambulatory Visit (HOSPITAL_COMMUNITY)
Admission: EM | Admit: 2020-07-07 | Discharge: 2020-07-07 | Disposition: A | Discharge status: Home / Self Care | Payer: Medicaid Other | Attending: Family Medicine | Admitting: Family Medicine

## 2020-07-07 DIAGNOSIS — S161XXA Strain of muscle, fascia and tendon at neck level, initial encounter: Secondary | ICD-10-CM | POA: Diagnosis not present

## 2020-07-07 MED ORDER — MELOXICAM 15 MG PO TABS: 15.0000 mg | ORAL_TABLET | Freq: Every day | ORAL | 0 refills | Status: DC | PRN

## 2020-07-07 MED ORDER — CYCLOBENZAPRINE HCL 10 MG PO TABS: 10.0000 mg | ORAL_TABLET | Freq: Three times a day (TID) | ORAL | 0 refills | Status: DC | PRN
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
## 2020-07-16 DIAGNOSIS — Z419 Encounter for procedure for purposes other than remedying health state, unspecified: Secondary | ICD-10-CM | POA: Diagnosis not present

## 2020-08-16 DIAGNOSIS — Z419 Encounter for procedure for purposes other than remedying health state, unspecified: Secondary | ICD-10-CM | POA: Diagnosis not present

## 2020-08-25 ENCOUNTER — Other Ambulatory Visit: Payer: Self-pay

## 2020-08-25 ENCOUNTER — Ambulatory Visit (HOSPITAL_COMMUNITY)
Admission: EM | Admit: 2020-08-25 | Discharge: 2020-08-25 | Disposition: A | Discharge status: Home / Self Care | Payer: Medicaid Other | Attending: Psychiatry | Admitting: Psychiatry

## 2020-08-25 DIAGNOSIS — F332 Major depressive disorder, recurrent severe without psychotic features: Secondary | ICD-10-CM | POA: Diagnosis not present

## 2020-08-25 DIAGNOSIS — Z733 Stress, not elsewhere classified: Secondary | ICD-10-CM | POA: Insufficient documentation

## 2020-08-25 DIAGNOSIS — Z6379 Other stressful life events affecting family and household: Secondary | ICD-10-CM | POA: Diagnosis not present

## 2020-08-25 DIAGNOSIS — Z20822 Contact with and (suspected) exposure to covid-19: Secondary | ICD-10-CM | POA: Diagnosis not present

## 2020-08-25 LAB — CBC WITH DIFFERENTIAL/PLATELET
Abs Immature Granulocytes: 0 10*3/uL (ref 0.00–0.07)
Basophils Absolute: 0 10*3/uL (ref 0.0–0.1)
Basophils Relative: 1 %
Eosinophils Absolute: 0.1 10*3/uL (ref 0.0–0.5)
Eosinophils Relative: 2 %
HCT: 42.1 % (ref 36.0–46.0)
Hemoglobin: 14.2 g/dL (ref 12.0–15.0)
Immature Granulocytes: 0 %
Lymphocytes Relative: 45 %
Lymphs Abs: 2.2 10*3/uL (ref 0.7–4.0)
MCH: 29.4 pg (ref 26.0–34.0)
MCHC: 33.7 g/dL (ref 30.0–36.0)
MCV: 87.2 fL (ref 80.0–100.0)
Monocytes Absolute: 0.3 10*3/uL (ref 0.1–1.0)
Monocytes Relative: 7 %
Neutro Abs: 2.2 10*3/uL (ref 1.7–7.7)
Neutrophils Relative %: 45 %
Platelets: 156 10*3/uL (ref 150–400)
RBC: 4.83 MIL/uL (ref 3.87–5.11)
RDW: 12.8 % (ref 11.5–15.5)
WBC: 4.9 10*3/uL (ref 4.0–10.5)
nRBC: 0 % (ref 0.0–0.2)

## 2020-08-25 LAB — COMPREHENSIVE METABOLIC PANEL
ALT: 17 U/L (ref 0–44)
AST: 23 U/L (ref 15–41)
Albumin: 4.1 g/dL (ref 3.5–5.0)
Alkaline Phosphatase: 38 U/L (ref 38–126)
Anion gap: 12 (ref 5–15)
BUN: 9 mg/dL (ref 6–20)
CO2: 22 mmol/L (ref 22–32)
Calcium: 9.9 mg/dL (ref 8.9–10.3)
Chloride: 106 mmol/L (ref 98–111)
Creatinine, Ser: 0.77 mg/dL (ref 0.44–1.00)
GFR, Estimated: >60 mL/min (ref >60)
Glucose, Bld: 65 mg/dL — ABNORMAL LOW (ref 70–99)
Potassium: 3.7 mmol/L (ref 3.5–5.1)
Sodium: 140 mmol/L (ref 135–145)
Total Bilirubin: 0.5 mg/dL (ref 0.3–1.2)
Total Protein: 8.7 g/dL — ABNORMAL HIGH (ref 6.5–8.1)

## 2020-08-25 LAB — POCT URINE DRUG SCREEN - MANUAL ENTRY (I-SCREEN)
POC Amphetamine UR: NOT DETECTED
POC Buprenorphine (BUP): NOT DETECTED
POC Cocaine UR: NOT DETECTED
POC Marijuana UR: POSITIVE — AB
POC Methadone UR: NOT DETECTED
POC Methamphetamine UR: NOT DETECTED
POC Morphine: NOT DETECTED
POC Oxazepam (BZO): NOT DETECTED
POC Oxycodone UR: NOT DETECTED
POC Secobarbital (BAR): NOT DETECTED

## 2020-08-25 LAB — HEMOGLOBIN A1C
Hgb A1c MFr Bld: 5.1 % (ref 4.8–5.6)
Mean Plasma Glucose: 99.67 mg/dL

## 2020-08-25 LAB — LIPID PANEL
Cholesterol: 149 mg/dL (ref 0–200)
HDL: 60 mg/dL (ref >40)
LDL Cholesterol: 79 mg/dL (ref 0–99)
Total CHOL/HDL Ratio: 2.5 RATIO
Triglycerides: 49 mg/dL (ref <150)
VLDL: 10 mg/dL (ref 0–40)

## 2020-08-25 LAB — RESPIRATORY PANEL BY RT PCR (FLU A&B, COVID)
Influenza A by PCR: NEGATIVE
Influenza B by PCR: NEGATIVE
SARS Coronavirus 2 by RT PCR: NEGATIVE

## 2020-08-25 LAB — POC SARS CORONAVIRUS 2 AG -  ED: SARS Coronavirus 2 Ag: NEGATIVE

## 2020-08-25 LAB — ETHANOL: Alcohol, Ethyl (B): <10 mg/dL (ref <10)

## 2020-08-25 LAB — TSH: TSH: 1.112 u[IU]/mL (ref 0.350–4.500)

## 2020-08-25 LAB — POCT PREGNANCY, URINE: Preg Test, Ur: NEGATIVE

## 2020-08-25 MED ORDER — TRAZODONE HCL 50 MG PO TABS: 50.0000 mg | ORAL_TABLET | Freq: Every evening | ORAL | 0 refills | Status: DC | PRN

## 2020-08-25 MED ORDER — HYDROXYZINE HCL 25 MG PO TABS: 25.0000 mg | ORAL_TABLET | Freq: Three times a day (TID) | ORAL | 0 refills | Status: DC | PRN

## 2020-08-25 MED ORDER — TRAZODONE HCL 50 MG PO TABS: 50.0000 mg | ORAL_TABLET | Freq: Every evening | ORAL | Status: DC | PRN

## 2020-08-25 MED ORDER — MAGNESIUM HYDROXIDE 400 MG/5ML PO SUSP: 30.0000 mL | Freq: Every day | ORAL | Status: DC | PRN

## 2020-08-25 MED ORDER — ALUM & MAG HYDROXIDE-SIMETH 200-200-20 MG/5ML PO SUSP: 30.0000 mL | ORAL | Status: DC | PRN

## 2020-08-25 MED ORDER — SERTRALINE HCL 50 MG PO TABS
50.0000 mg | ORAL_TABLET | Freq: Every day | ORAL | Status: DC
  Administered 2020-08-25: 50 mg via ORAL
  Filled 2020-08-25: qty 1

## 2020-08-25 MED ORDER — SERTRALINE HCL 50 MG PO TABS: 50.0000 mg | ORAL_TABLET | Freq: Every day | ORAL | 0 refills | Status: DC

## 2020-08-25 MED ORDER — HYDROXYZINE HCL 25 MG PO TABS
25.0000 mg | ORAL_TABLET | Freq: Three times a day (TID) | ORAL | Status: DC | PRN
  Administered 2020-08-25: 25 mg via ORAL
  Filled 2020-08-25: qty 1

## 2020-08-25 MED ORDER — ACETAMINOPHEN 325 MG PO TABS: 650.0000 mg | ORAL_TABLET | Freq: Four times a day (QID) | ORAL | Status: DC | PRN

## 2020-08-25 NOTE — ED Notes (Signed)
Patient continues to make several calls. Patient is comfortable and safe on unit.
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
## 2020-08-25 NOTE — ED Notes (Signed)
Pt brought in voluntarily by [employer] with SI due to stressors at home "a lot of things." Pt is not forthcoming of any specific stressors. Pt sad and teary eyed.Pt denies HI, AVH and any pain. Education, support and encouragement provided. Pt denies any concerns at this time, and verbally contracts for safety. Pt ambulating on the unit with no issues. Pt remains safe on the unit.

## 2020-08-25 NOTE — ED Notes (Signed)
RN spoke with pt's grandmother Hilda Lias about a safety plan for pt when she is discharged. Pt's grandmother informed RN that pt will be staying with her at her apartment and will be safe there. Pt's grandmother also informed RN that pt will not have access to any weapons or firearms while she is staying with her.

## 2020-08-25 NOTE — ED Notes (Signed)
Patient BP, pulse are high

## 2020-08-25 NOTE — Discharge Instructions (Addendum)
Patient is instructed prior to discharge to: Take all medications as prescribed by his/her mental healthcare provider. Report any adverse effects and or reactions from the medicines to his/her outpatient provider promptly. Patient has been instructed & cautioned: To not engage in alcohol and or illegal drug use while on prescription medicines. In the event of worsening symptoms, patient is instructed to call the crisis hotline, 911 and or go to the nearest ED for appropriate evaluation and treatment of symptoms. To follow-up with his/her primary care provider for your other medical issues, concerns and or health care needs.   Patient is instructed prior to discharge to: Take all medications as prescribed by his/her mental healthcare provider. Report any adverse effects and or reactions from the medicines to his/her outpatient provider promptly. Patient has been instructed & cautioned: To not engage in alcohol and or illegal drug use while on prescription medicines. In the event of worsening symptoms, patient is instructed to call the crisis hotline, 911 and or go to the nearest ED for appropriate evaluation and treatment of symptoms. To follow-up with his/her primary care provider for your other medical issues, concerns and or health care needs.
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
## 2020-08-25 NOTE — ED Notes (Signed)
Patient belongings are placed in locker 25

## 2020-09-15 DIAGNOSIS — Z419 Encounter for procedure for purposes other than remedying health state, unspecified: Secondary | ICD-10-CM | POA: Diagnosis not present

## 2020-10-16 DIAGNOSIS — Z419 Encounter for procedure for purposes other than remedying health state, unspecified: Secondary | ICD-10-CM | POA: Diagnosis not present

## 2020-11-03 ENCOUNTER — Encounter (HOSPITAL_COMMUNITY): Payer: Self-pay

## 2020-11-03 ENCOUNTER — Other Ambulatory Visit: Payer: Self-pay

## 2020-11-03 ENCOUNTER — Ambulatory Visit (HOSPITAL_COMMUNITY)
Admission: EM | Admit: 2020-11-03 | Discharge: 2020-11-03 | Disposition: A | Discharge status: Home / Self Care | Payer: Medicaid Other | Attending: Family Medicine | Admitting: Family Medicine

## 2020-11-03 DIAGNOSIS — M542 Cervicalgia: Secondary | ICD-10-CM

## 2020-11-03 DIAGNOSIS — N921 Excessive and frequent menstruation with irregular cycle: Secondary | ICD-10-CM | POA: Diagnosis not present

## 2020-11-03 MED ORDER — CYCLOBENZAPRINE HCL 5 MG PO TABS: 5.0000 mg | ORAL_TABLET | Freq: Three times a day (TID) | ORAL | 0 refills | Status: DC | PRN

## 2020-11-03 MED ORDER — NAPROXEN 500 MG PO TABS: 500.0000 mg | ORAL_TABLET | Freq: Two times a day (BID) | ORAL | 0 refills | Status: DC | PRN

## 2020-11-03 MED ORDER — MEGESTROL ACETATE 40 MG PO TABS: 40.0000 mg | ORAL_TABLET | Freq: Two times a day (BID) | ORAL | 0 refills | Status: DC
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
## 2020-11-16 DIAGNOSIS — Z419 Encounter for procedure for purposes other than remedying health state, unspecified: Secondary | ICD-10-CM | POA: Diagnosis not present

## 2020-12-01 ENCOUNTER — Other Ambulatory Visit (HOSPITAL_COMMUNITY)
Admission: RE | Admit: 2020-12-01 | Discharge: 2020-12-01 | Disposition: A | Discharge status: Home / Self Care | Payer: Medicaid Other | Source: Ambulatory Visit | Attending: Obstetrics and Gynecology | Admitting: Obstetrics and Gynecology

## 2020-12-01 ENCOUNTER — Encounter: Payer: Self-pay | Admitting: Obstetrics and Gynecology

## 2020-12-01 ENCOUNTER — Ambulatory Visit (INDEPENDENT_AMBULATORY_CARE_PROVIDER_SITE_OTHER): Payer: Medicaid Other | Admitting: Obstetrics and Gynecology

## 2020-12-01 ENCOUNTER — Other Ambulatory Visit: Payer: Self-pay

## 2020-12-01 VITALS — BP 116/76 | HR 101 | Temp 97.7°F | Wt 129.8 lb

## 2020-12-01 DIAGNOSIS — Z01419 Encounter for gynecological examination (general) (routine) without abnormal findings: Secondary | ICD-10-CM

## 2020-12-01 DIAGNOSIS — N939 Abnormal uterine and vaginal bleeding, unspecified: Secondary | ICD-10-CM

## 2020-12-01 DIAGNOSIS — Z113 Encounter for screening for infections with a predominantly sexual mode of transmission: Secondary | ICD-10-CM

## 2020-12-01 DIAGNOSIS — Z1272 Encounter for screening for malignant neoplasm of vagina: Secondary | ICD-10-CM | POA: Insufficient documentation

## 2020-12-01 MED ORDER — MEGESTROL ACETATE 40 MG PO TABS: 40.0000 mg | ORAL_TABLET | Freq: Two times a day (BID) | ORAL | 1 refills | Status: DC

## 2020-12-01 NOTE — Patient Instructions (Signed)
Abnormal Uterine Bleeding Abnormal uterine bleeding means bleeding more than usual from your womb (uterus). It can include:  Bleeding between menstrual periods.  Bleeding after sex.  Bleeding that is heavier than normal.  Menstrual periods that last longer than usual.  Bleeding after you have stopped having your menstrual period (menopause). There are many problems that may cause this. You should see a doctor for any kind of bleeding that is not normal. Treatment depends on the cause of the bleeding. Follow these instructions at home: Medicines  Take over-the-counter and prescription medicines only as told by your doctor.  Tell your doctor about other medicines that you take. ? If told by your doctor, stop taking aspirin or medicines that have aspirin in them. These medicines can make you bleed more.  You may be given iron pills to replace iron that your body loses because of this condition. Take them as told by your doctor. Managing constipation If you are taking iron pills, you may have trouble pooping (constipation). To prevent or treat trouble pooping, you may need to:  Drink enough fluid to keep your pee (urine) pale yellow.  Take over-the-counter or prescription medicines.  Eat foods that are high in fiber. These include beans, whole grains, and fresh fruits and vegetables.  Limit foods that are high in fat and sugar. These include fried or sweet foods. General instructions  Watch your condition for any changes.  Do not use tampons, douche, or have sex, if your doctor tells you not to.  Change your pads often.  Get regular exams. This includes pelvic exams and cervical cancer screenings. ? It is up to you to get the results of any tests that are done. Ask your doctor, or the department that is doing the tests, when your results will be ready.  Keep all follow-up visits as told by your doctor. This is important. Contact a doctor if:  The bleeding lasts more than 1  week.  You feel dizzy at times.  You feel like you may vomit (nausea).  You vomit.  You feel light-headed or weak.  Your symptoms get worse. Get help right away if:  You pass out.  You have to change pads every hour.  You have pain in your belly.  You have a fever or chills.  You get sweaty.  You get weak.  You pass large blood clots from your vagina. Summary  Abnormal uterine bleeding means bleeding more than usual from your womb (uterus).  Any kind of bleeding that is not normal should be checked by a doctor.  Treatment depends on the cause of the bleeding.  Get help right away if you pass out, you have to change pads every hour, or you pass large blood clots from your vagina. This information is not intended to replace advice given to you by your health care provider. Make sure you discuss any questions you have with your health care provider. Document Revised: 08/05/2019 Document Reviewed: 08/05/2019 Elsevier Patient Education  2021 Elsevier Inc.  

## 2020-12-01 NOTE — Progress Notes (Signed)
WELL-WOMAN PHYSICAL [facility] PAP Patient name: Robin Nguyen MRN [id number]  Date of birth: 02/18/1991 Chief Complaint:   Establish Care and Menstrual Problem  History of Present Illness:   Robin Nguyen is a 30 y.o. [id number] African female being seen today for a routine well-woman exam.  Current complaints: irregular vaginal bleeding since new sex partner in September 2021. She has unprotected sex with this partner and is requesting STI testing.  PCP: none      does desire labs Patient's last menstrual period was 11/03/2020. The current method of family planning is none.  Last pap 2018. Results were: normal Last mammogram: n/a. Results were: n/a. Family h/o breast cancer: No Last colonoscopy: n/a. Results were: n/a. Family h/o colorectal cancer: No Review of Systems:   Pertinent items are noted in HPI Denies any headaches, blurred vision, fatigue, shortness of breath, chest pain, abdominal pain, abnormal vaginal discharge/itching/odor/irritation, problems with periods, bowel movements, urination, or intercourse unless otherwise stated above. Pertinent History Reviewed:  Reviewed past medical,surgical, social and family history.  Reviewed problem list, medications and allergies. Physical Assessment:   Vitals:   12/01/20 1307  BP: 116/76  Pulse: (!) 101  Temp: 97.7 F (36.5 C)  TempSrc: Oral  Weight: 129 lb 12.8 oz (58.9 kg)  Body mass index is 21.89 kg/m.        Physical Examination:   General appearance - well appearing, and in no distress  Mental status - alert, oriented to person, place, and time  Psych:  She has a normal mood and affect  Skin - warm and dry, normal color, no suspicious lesions noted  Chest - effort normal, all lung fields clear to auscultation bilaterally  Heart - normal rate and regular rhythm  Neck:  midline trachea, no thyromegaly or nodules  Breasts - breasts appear normal, no suspicious masses, no skin or nipple changes or  axillary nodes  Abdomen  - soft, nontender, nondistended, no masses or organomegaly  Pelvic - VULVA: normal appearing vulva with no masses, tenderness or lesions  VAGINA: normal appearing vagina with normal color and discharge, no lesions  CERVIX: normal appearing cervix without discharge or lesions, no CMT  Thin prep pap is done with reflex HR HPV cotesting  UTERUS: uterus is felt to be normal size, shape, consistency and nontender   ADNEXA: No adnexal masses or tenderness noted.  Rectal - deferred  Extremities:  No swelling or varicosities noted  No results found for this or any previous visit (from the past 24 hour(s)).  Assessment & Plan:  1) Well woman exam with routine gynecological exam  2) Screen for STD (sexually transmitted disease)  - Cervicovaginal ancillary only( ),  - RPR+HBsAg+HCVAb+...  Abnormal uterine bleeding (AUB)  - US PELVIC COMPLETE WITH TRANSVAGINAL,  - Rx renewed for megestrol (MEGACE) 40 MG tablet BID x 2 months   Labs/procedures today: pap, STI testing  Mammogram at age 49 or sooner if problems Colonoscopy at age 46 or sooner if problems  Orders Placed This Encounter  Procedures  . US PELVIC COMPLETE WITH TRANSVAGINAL  . RPR+HBsAg+HCVAb+...    Meds:  Meds ordered this encounter  Medications  . megestrol (MEGACE) 40 MG tablet    Sig: Take 1 tablet (40 mg total) by mouth 2 (two) times daily.    Dispense:  60 tablet    Refill:  1    Order Specific Question:   Supervising Provider    Answer:   Samara Snide  Follow-up: Return in about 1 year (around 12/01/2021) for Annual Exam.  Raelyn Mora MSN, CNM 12/01/2020 1:35 PM

## 2020-12-03 LAB — CERVICOVAGINAL ANCILLARY ONLY
Bacterial Vaginitis (gardnerella): NEGATIVE
Candida Glabrata: NEGATIVE
Candida Vaginitis: NEGATIVE
Chlamydia: POSITIVE — AB
Comment: NEGATIVE
Comment: NEGATIVE
Comment: NEGATIVE
Comment: NEGATIVE
Comment: NEGATIVE
Comment: NORMAL
Neisseria Gonorrhea: NEGATIVE
Trichomonas: NEGATIVE

## 2020-12-04 ENCOUNTER — Encounter (HOSPITAL_COMMUNITY): Payer: Self-pay | Admitting: Emergency Medicine

## 2020-12-04 ENCOUNTER — Other Ambulatory Visit: Payer: Self-pay

## 2020-12-04 ENCOUNTER — Emergency Department (HOSPITAL_COMMUNITY)
Admission: EM | Admit: 2020-12-04 | Discharge: 2020-12-04 | Disposition: A | Discharge status: Home / Self Care | Payer: Medicaid Other | Attending: Emergency Medicine | Admitting: Emergency Medicine

## 2020-12-04 DIAGNOSIS — Z5321 Procedure and treatment not carried out due to patient leaving prior to being seen by health care provider: Secondary | ICD-10-CM | POA: Diagnosis not present

## 2020-12-04 DIAGNOSIS — N938 Other specified abnormal uterine and vaginal bleeding: Secondary | ICD-10-CM | POA: Diagnosis present

## 2020-12-04 DIAGNOSIS — A749 Chlamydial infection, unspecified: Secondary | ICD-10-CM | POA: Insufficient documentation

## 2020-12-04 LAB — URINALYSIS, ROUTINE W REFLEX MICROSCOPIC
Bilirubin Urine: NEGATIVE
Glucose, UA: NEGATIVE mg/dL
Ketones, ur: NEGATIVE mg/dL
Nitrite: NEGATIVE
Protein, ur: NEGATIVE mg/dL
RBC / HPF: >50 RBC/hpf — ABNORMAL HIGH (ref 0–5)
Specific Gravity, Urine: 1.01 (ref 1.005–1.030)
pH: 6 (ref 5.0–8.0)

## 2020-12-04 LAB — CBC
HCT: 39.9 % (ref 36.0–46.0)
Hemoglobin: 12.9 g/dL (ref 12.0–15.0)
MCH: 28.4 pg (ref 26.0–34.0)
MCHC: 32.3 g/dL (ref 30.0–36.0)
MCV: 87.9 fL (ref 80.0–100.0)
Platelets: 173 10*3/uL (ref 150–400)
RBC: 4.54 MIL/uL (ref 3.87–5.11)
RDW: 13.2 % (ref 11.5–15.5)
WBC: 5.8 10*3/uL (ref 4.0–10.5)
nRBC: 0 % (ref 0.0–0.2)

## 2020-12-04 LAB — LIPASE, BLOOD: Lipase: 34 U/L (ref 11–51)

## 2020-12-04 LAB — COMPREHENSIVE METABOLIC PANEL
ALT: 29 U/L (ref 0–44)
AST: 28 U/L (ref 15–41)
Albumin: 3.8 g/dL (ref 3.5–5.0)
Alkaline Phosphatase: 36 U/L — ABNORMAL LOW (ref 38–126)
Anion gap: 11 (ref 5–15)
BUN: 11 mg/dL (ref 6–20)
CO2: 20 mmol/L — ABNORMAL LOW (ref 22–32)
Calcium: 9.5 mg/dL (ref 8.9–10.3)
Chloride: 108 mmol/L (ref 98–111)
Creatinine, Ser: 1.08 mg/dL — ABNORMAL HIGH (ref 0.44–1.00)
GFR, Estimated: >60 mL/min (ref >60)
Glucose, Bld: 89 mg/dL (ref 70–99)
Potassium: 3.7 mmol/L (ref 3.5–5.1)
Sodium: 139 mmol/L (ref 135–145)
Total Bilirubin: 0.5 mg/dL (ref 0.3–1.2)
Total Protein: 7.6 g/dL (ref 6.5–8.1)

## 2020-12-04 LAB — RPR+HBSAG+HCVAB+...
HIV Screen 4th Generation wRfx: NONREACTIVE
Hep C Virus Ab: <0.1 s/co ratio (ref 0.0–0.9)
RPR Ser Ql: NONREACTIVE

## 2020-12-04 LAB — I-STAT BETA HCG BLOOD, ED (MC, WL, AP ONLY): I-stat hCG, quantitative: <5 m[IU]/mL (ref <5)

## 2020-12-04 LAB — HEPATITIS B SURFACE AG, CONFIRM: HBsAG Confirmation: POSITIVE — AB

## 2020-12-04 NOTE — ED Notes (Signed)
Patient states she is leaving. 

## 2020-12-04 NOTE — ED Triage Notes (Signed)
Reports vaginal bleeding since September with generalized abd pain.  States she tested + for chlamydia on 2/16 and needs treatment.

## 2020-12-06 ENCOUNTER — Encounter: Payer: Self-pay | Admitting: *Deleted

## 2020-12-06 ENCOUNTER — Other Ambulatory Visit: Payer: Self-pay

## 2020-12-06 ENCOUNTER — Ambulatory Visit
Admission: RE | Admit: 2020-12-06 | Discharge: 2020-12-06 | Disposition: A | Discharge status: Home / Self Care | Payer: Medicaid Other | Source: Ambulatory Visit | Attending: Obstetrics and Gynecology | Admitting: Obstetrics and Gynecology

## 2020-12-06 ENCOUNTER — Telehealth: Payer: Self-pay | Admitting: *Deleted

## 2020-12-06 DIAGNOSIS — N939 Abnormal uterine and vaginal bleeding, unspecified: Secondary | ICD-10-CM | POA: Diagnosis not present

## 2020-12-06 DIAGNOSIS — A749 Chlamydial infection, unspecified: Secondary | ICD-10-CM

## 2020-12-06 LAB — CYTOLOGY - PAP
Comment: NEGATIVE
Diagnosis: UNDETERMINED — AB
High risk HPV: POSITIVE — AB

## 2020-12-06 MED ORDER — DOXYCYCLINE HYCLATE 100 MG PO TABS: 100.0000 mg | ORAL_TABLET | Freq: Two times a day (BID) | ORAL | 0 refills | Status: DC

## 2020-12-06 NOTE — Telephone Encounter (Signed)
Left voice message for patient to return nurse call. Patient left message with triage line on Friday 12/03/20 afternoon. Patient is +chlamdyia. Doxycycline 100 mg 1 tab PO BID x 7 days. Patient will need re-screening in 1 month. STD report faxed to [facility].   Will send a mychart message.   Clovis Pu, RN

## 2020-12-08 ENCOUNTER — Other Ambulatory Visit: Payer: Self-pay

## 2020-12-08 ENCOUNTER — Encounter (HOSPITAL_COMMUNITY): Payer: Self-pay | Admitting: Emergency Medicine

## 2020-12-08 ENCOUNTER — Ambulatory Visit (HOSPITAL_COMMUNITY)
Admission: EM | Admit: 2020-12-08 | Discharge: 2020-12-08 | Disposition: A | Discharge status: Home / Self Care | Payer: Medicaid Other | Attending: Family Medicine | Admitting: Family Medicine

## 2020-12-08 DIAGNOSIS — Z20822 Contact with and (suspected) exposure to covid-19: Secondary | ICD-10-CM | POA: Insufficient documentation

## 2020-12-08 DIAGNOSIS — J069 Acute upper respiratory infection, unspecified: Secondary | ICD-10-CM

## 2020-12-08 NOTE — Discharge Instructions (Addendum)
You have been tested for COVID-19 today. °If your test returns positive, you will receive a phone call from [facility] regarding your results. °Negative test results are not called. °Both positive and negative results area always visible on MyChart. °If you do not have a MyChart account, sign up instructions are provided in your discharge papers. °Please do not hesitate to contact us should you have questions or concerns. ° °

## 2020-12-08 NOTE — ED Triage Notes (Signed)
Pt presents with headache, cough and body aches, States chest burns with cough. States symptoms started last night.

## 2020-12-08 NOTE — ED Provider Notes (Signed)
[facility]   [id number] 12/08/20 Arrival Time: 1540  ASSESSMENT & PLAN:  1. Viral URI     COVID-19 testing sent. See letter/work note on file for self-isolation guidelines. OTC symptom care as needed.  No orders of the defined types were placed in this encounter.    Follow-up Information    [facility].   Specialty: Urgent Care Why: As needed. Contact information: [address] [place] [address] [phone]              Reviewed expectations re: course of current medical issues. Questions answered. Outlined signs and symptoms indicating need for more acute intervention. Understanding verbalized. After Visit Summary given.   SUBJECTIVE: History from: Robin Nguyen. [name] is a 30 y.o. female who presents with worries regarding COVID-19. Known COVID-19 contact: none. Recent travel: none. Reports: body aches, nasal congestion, cough; abrupt onset yest evening. Denies: fever and difficulty breathing. Normal PO intake without n/v/d.    OBJECTIVE:  Vitals:   12/08/20 1549  BP: 114/78  Pulse: 97  Resp: 17  Temp: 98.4 F (36.9 C)  TempSrc: Oral  SpO2: 100%    General appearance: alert; no distress Eyes: PERRLA; EOMI; conjunctiva normal HENT: [place]; AT; with nasal congestion Neck: supple  Lungs: speaks full sentences without difficulty; unlabored; dry cough Extremities: no edema Skin: warm and dry Neurologic: normal gait Psychological: alert and cooperative; normal mood and affect  Labs:  Labs Reviewed  SARS CORONAVIRUS 2 (TAT 6-24 HRS)    No Known Allergies  Past Medical History:  Diagnosis Date  . Depression   . Gestational thrombocytopenia (HCC)   . Headache   . Hepatitis B   . HSV (herpes simplex virus) anogenital infection   . Migraine   . Placenta previa 01/10/2015   Social History   Socioeconomic History  . Marital status: Single    Spouse name: Not on file  . Number of  children: 2  . Years of education: HS  . Highest education level: Not on file  Occupational History  . 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  Tobacco Use  . Smoking status: Never Smoker  . Smokeless tobacco: Never Used  Vaping Use  . Vaping Use: Never used  Substance and Sexual Activity  . Alcohol use: No  . Drug use: No  . Sexual activity: Yes    Partners: Male    Birth control/protection: Surgical    Comment: last sex several months ago  Other Topics Concern  . Not on file  Social History Narrative   Right-handed.   No caffeine use.   Lives at home with her daughter.   Social Determinants of Health   Financial Resource Strain: Not on file  Food Insecurity: Not on file  Transportation Needs: Not on file  Physical Activity: Not on file  Stress: Not on file  Social Connections: Not on file  Intimate Partner Violence: Not on file   Family History  Problem Relation Age of Onset  . Hypertension Mother   . Healthy Father    Past Surgical History:  Procedure Laterality Date  . CESAREAN SECTION N/A 02/03/2015   Procedure: CESAREAN SECTION;  Surgeon: Myna Hidalgo, DO;  Location: [facility];  Service: Obstetrics;  Laterality: N/A;  . CESAREAN SECTION WITH BILATERAL TUBAL LIGATION N/A 01/17/2017   Procedure: CESAREAN SECTION WITH BILATERAL TUBAL LIGATION;  Surgeon: Myna Hidalgo, DO;  Location: [facility];  Service: Obstetrics;  Laterality: N/A;  EDD 01/24/17  Mardella Layman, MD 12/08/20 [phone]

## 2020-12-09 LAB — SARS CORONAVIRUS 2 (TAT 6-24 HRS): SARS Coronavirus 2: NEGATIVE

## 2020-12-11 ENCOUNTER — Other Ambulatory Visit: Payer: Self-pay | Admitting: Obstetrics and Gynecology

## 2020-12-11 DIAGNOSIS — A749 Chlamydial infection, unspecified: Secondary | ICD-10-CM

## 2020-12-11 NOTE — Progress Notes (Signed)
Rx for Doxycycline already sent on 12/06/2020

## 2020-12-13 ENCOUNTER — Other Ambulatory Visit: Payer: Self-pay

## 2020-12-13 ENCOUNTER — Ambulatory Visit (INDEPENDENT_AMBULATORY_CARE_PROVIDER_SITE_OTHER): Payer: Medicaid Other | Admitting: Internal Medicine

## 2020-12-13 ENCOUNTER — Other Ambulatory Visit (INDEPENDENT_AMBULATORY_CARE_PROVIDER_SITE_OTHER): Payer: Medicaid Other | Admitting: *Deleted

## 2020-12-13 ENCOUNTER — Encounter: Payer: Self-pay | Admitting: Internal Medicine

## 2020-12-13 ENCOUNTER — Other Ambulatory Visit (INDEPENDENT_AMBULATORY_CARE_PROVIDER_SITE_OTHER): Payer: Medicaid Other | Admitting: Obstetrics and Gynecology

## 2020-12-13 ENCOUNTER — Telehealth: Payer: Self-pay

## 2020-12-13 VITALS — BP 128/82 | HR 84 | Wt 127.0 lb

## 2020-12-13 DIAGNOSIS — B181 Chronic viral hepatitis B without delta-agent: Secondary | ICD-10-CM

## 2020-12-13 DIAGNOSIS — A749 Chlamydial infection, unspecified: Secondary | ICD-10-CM | POA: Diagnosis not present

## 2020-12-13 DIAGNOSIS — Z23 Encounter for immunization: Secondary | ICD-10-CM | POA: Insufficient documentation

## 2020-12-13 MED ORDER — DOXYCYCLINE HYCLATE 100 MG PO TABS: 100.0000 mg | ORAL_TABLET | Freq: Two times a day (BID) | ORAL | 0 refills | Status: AC

## 2020-12-13 NOTE — Progress Notes (Signed)
   Patient in clinic for CMP lab. Clovis Pu, RN

## 2020-12-13 NOTE — Progress Notes (Signed)
[facility]  Reason for Consult: hepatitis B  Referring Provider: Tinnie Gens   HPI:    Robin Nguyen is a 30 y.o. female with PMHx as below who presents to the clinic for further evaluation of hepatitis B.   Recently seen at [facility] and requested STI testing due to irregular vaginal bleeding since new sex partner in September 2021.  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  Also positive for CT and sent prescription for doxycycline x 1 week.  HIV, RPR, HCV testing were negative.  She is surface Ag positive for hepatitis B and now presents to our clinic for further evaluation. LFTs 12/04/20 are normal.  She has known about this dx since about 2015 when she previously followed with Dr Luciana Axe.  At that time she had low level virus not warranting treatment. She is originally from [place] and been in the [place] about 10 years.  She has no hx of jaundice, no liver dz in her family.  No hx of liver cancer.   Patient's Medications  New Prescriptions   No medications on file  Previous Medications   CYCLOBENZAPRINE (FLEXERIL) 5 MG TABLET    Take 1 tablet (5 mg total) by mouth 3 (three) times daily as needed for muscle spasms. DO NOT DRINK ALCOHOL OR DRIVE WHILE TAKING THIS MEDICATION   HYDROXYZINE (ATARAX/VISTARIL) 25 MG TABLET    Take 1 tablet (25 mg total) by mouth 3 (three) times daily as needed for anxiety.   MEGESTROL (MEGACE) 40 MG TABLET    Take 1 tablet (40 mg total) by mouth 2 (two) times daily.   NAPROXEN (NAPROSYN) 500 MG TABLET    Take 1 tablet (500 mg total) by mouth 2 (two) times daily as needed.   NORGESTIMATE-ETH ESTRADIOL (SPRINTEC 28 PO)    Take by mouth.   SERTRALINE (ZOLOFT) 50 MG TABLET    Take 1 tablet (50 mg total) by mouth daily.   TRAZODONE (DESYREL) 50 MG TABLET    Take 1 tablet (50 mg total) by mouth at bedtime as needed for sleep.  Modified Medications   Modified Medication Previous Medication    DOXYCYCLINE (VIBRA-TABS) 100 MG TABLET doxycycline (VIBRA-TABS) 100 MG tablet      Take 1 tablet (100 mg total) by mouth 2 (two) times daily for 7 days.    Take 1 tablet (100 mg total) by mouth 2 (two) times daily for 7 days.  Discontinued Medications   No medications on file      Past Medical History:  Diagnosis Date  . Depression   . Gestational thrombocytopenia (HCC)   . Headache   . Hepatitis B   . HSV (herpes simplex virus) anogenital infection   . Migraine   . Placenta previa 01/10/2015    Social History   Tobacco Use  . Smoking status: Never Smoker  . Smokeless tobacco: Never Used  Vaping Use  . Vaping Use: Never used  Substance Use Topics  . Alcohol use: No  . Drug use: No    Family History  Problem Relation Age of Onset  . Hypertension Mother   . Healthy Father     No Known Allergies  Review of Systems  Constitutional: Negative for chills and fever.  Respiratory: Negative.   Cardiovascular: Negative.   Gastrointestinal: Negative.   Skin: Negative.       OBJECTIVE:    Vitals:   12/13/20  1445  BP: 128/82  Pulse: 84  Weight: 127 lb (57.6 kg)     Body mass index is 21.42 kg/m.  Physical Exam Constitutional:      General: She is not in acute distress.    Appearance: Normal appearance.  Pulmonary:     Effort: Pulmonary effort is normal. No respiratory distress.  Neurological:     General: No focal deficit present.     Mental Status: She is alert and oriented to person, place, and time.  Psychiatric:        Mood and Affect: Mood normal.        Behavior: Behavior normal.      Labs and Microbiology:  CBC Latest Ref Rng & Units 12/04/2020 08/25/2020 08/21/2019  WBC 4.0 - 10.5 K/uL 5.8 4.9 7.6  Hemoglobin 12.0 - 15.0 g/dL 31.4 97.0 26.3  Hematocrit 36.0 - 46.0 % 39.9 42.1 40.0  Platelets 150 - 400 K/uL 173 156 177   CMP Latest Ref Rng & Units 12/04/2020 08/25/2020 08/21/2019  Glucose 70 - 99 mg/dL 89 78(H) 91  BUN 6 - 20 mg/dL 11 9 15    Creatinine 0.44 - 1.00 mg/dL ) 8.85(O 2.77  Sodium 135 - 145 mmol/L 139 140 137  Potassium 3.5 - 5.1 mmol/L 3.7 3.7 4.1  Chloride 98 - 111 mmol/L 108 106 105  CO2 22 - 32 mmol/L 20(L) 22 22  Calcium 8.9 - 10.3 mg/dL 9.5 9.9 9.7  Total Protein 6.5 - 8.1 g/dL 7.6 4.12) 8.2(H)  Total Bilirubin 0.3 - 1.2 mg/dL 0.5 0.5 0.3  Alkaline Phos 38 - 126 U/L 36(L) 38 47  AST 15 - 41 U/L 28 23 24   ALT 0 - 44 U/L 29 17 32     Recent Results (from the past 240 hour(s))  SARS CORONAVIRUS 2 (TAT 6-24 HRS) Nasopharyngeal Nasopharyngeal Swab     Status: None   Collection Time: 12/08/20  4:00 PM   Specimen: Nasopharyngeal Swab  Result Value Ref Range Status   SARS Coronavirus 2 NEGATIVE NEGATIVE Final    Comment: (NOTE) SARS-CoV-2 target nucleic acids are NOT DETECTED.  The SARS-CoV-2 RNA is generally detectable in upper and lower respiratory specimens during the acute phase of infection. Negative results do not preclude SARS-CoV-2 infection, do not rule out co-infections with other pathogens, and should not be used as the sole basis for treatment or other patient management decisions. Negative results must be combined with clinical observations, patient history, and epidemiological information. The expected result is Negative.  Fact Sheet for Patients:  Fact Sheet for Healthcare Providers: 12/10/20  This test is not yet approved or cleared by the [place] FDA and  has been authorized for detection and/or diagnosis of SARS-CoV-2 by FDA under an Emergency Use Authorization (EUA). This EUA will remain  in effect (meaning this test can be used) for the duration of the COVID-19 declaration under Se ction 564(b)(1) of the Act, 21 U.S.C. section 360bbb-3(b)(1), unless the authorization is terminated or revoked sooner.  Performed at [facility], [address] [place], [address] [place]        ASSESSMENT & PLAN:    Hepatitis B infection Previously followed in 2015 by Dr [id number] in our clinic but not seen since that time.  At that time appeared c/w inactive carrier.  Has not been on antiviral therapy.  Will repeat labs today to see if any treatment is indicated.  She has abdominal 2016 ordered by her Ob/Gyn.  Chlamydia infection  She recently tested positive and has been taking her doxycycline once per day for 7 days.  Discussed that the correct dosing is twice per day.  I went over this in detail with her today and sent in a new prescription for 7 days twice daily to treat.  Need for COVID-19 vaccine She is due for her COVID booster so will give this today.    Orders Placed This Encounter  Procedures  . Hepatitis B core antibody, total  . Hepatitis B DNA, ultraquantitative, PCR  . Hepatitis B e antibody  . Hepatitis B e antigen  . Hepatitis B surface antibody,qualitative  . Hepatitis B surface antigen  . Liver Fibrosis, FibroTest-ActiTest  . Hepatitis A antibody, total        [facility] 12/13/2020, 3:14 PM

## 2020-12-13 NOTE — Assessment & Plan Note (Signed)
She recently tested positive and has been taking her doxycycline once per day for 7 days.  Discussed that the correct dosing is twice per day.  I went over this in detail with her today and sent in a new prescription for 7 days twice daily to treat.

## 2020-12-13 NOTE — Assessment & Plan Note (Addendum)
Previously followed in 2015 by Dr Luciana Axe in our clinic but not seen since that time.  At that time appeared c/w inactive carrier.  Has not been on antiviral therapy.  Will repeat labs today to see if any treatment is indicated.  She has abdominal US ordered by her Ob/Gyn.

## 2020-12-13 NOTE — Patient Instructions (Signed)
Thank you for coming to see me today. It was a pleasure seeing you.  To Do: Marland Kitchen For Hepatitis B: - Lab work today.  Once I have this information back I will let you know about treatment or observation   For Chlamydia:  I sent in a new prescription for Doxycycline.  Take this 1 pill in the morning and 1 pill in the evening for 7 days   Follow up with me in 3 months  If you have any questions or concerns, please do not hesitate to call the office at [phone].  Take Care,   Gwynn Burly, DO

## 2020-12-13 NOTE — Progress Notes (Signed)
TC to patient to notify of (+) Hepatitis B  Patient reports known Hepatitis B hx, but has no PCP d/t change in insurance and not being followed by ID  Advised patient of Dr. Denyce Robert recommendations to refer to ID, CMP and RUQ U/S of liver.  Raelyn Mora, CNM

## 2020-12-13 NOTE — Assessment & Plan Note (Signed)
She is due for her COVID booster so will give this today.

## 2020-12-13 NOTE — Telephone Encounter (Signed)
[facility] Patient Product/process development scientist completed.    The patient is insured through [facility] and has a $0.00 copay.  Insurance will pay for the generic of Viread.  We will continue to follow to see if copay assistance is needed.  Clearance Coots, CPhT Specialty Pharmacy Patient [facility] Phone: [phone] Fax:  [phone]

## 2020-12-14 DIAGNOSIS — Z419 Encounter for procedure for purposes other than remedying health state, unspecified: Secondary | ICD-10-CM | POA: Diagnosis not present

## 2020-12-14 LAB — COMPREHENSIVE METABOLIC PANEL
ALT: 45 IU/L — ABNORMAL HIGH (ref 0–32)
AST: 35 IU/L (ref 0–40)
Albumin/Globulin Ratio: 1.3 (ref 1.2–2.2)
Albumin: 4.5 g/dL (ref 3.9–5.0)
Alkaline Phosphatase: 56 IU/L (ref 44–121)
BUN/Creatinine Ratio: 15 (ref 9–23)
BUN: 12 mg/dL (ref 6–20)
Bilirubin Total: 0.2 mg/dL (ref 0.0–1.2)
CO2: 17 mmol/L — ABNORMAL LOW (ref 20–29)
Calcium: 9.7 mg/dL (ref 8.7–10.2)
Chloride: 108 mmol/L — ABNORMAL HIGH (ref 96–106)
Creatinine, Ser: 0.78 mg/dL (ref 0.57–1.00)
Globulin, Total: 3.5 g/dL (ref 1.5–4.5)
Glucose: 83 mg/dL (ref 65–99)
Potassium: 4.1 mmol/L (ref 3.5–5.2)
Sodium: 141 mmol/L (ref 134–144)
Total Protein: 8 g/dL (ref 6.0–8.5)
eGFR: 105 mL/min/{1.73_m2} (ref >59)

## 2020-12-20 LAB — LIVER FIBROSIS, FIBROTEST-ACTITEST
ALT: 42 U/L — ABNORMAL HIGH (ref 6–29)
Alpha-2-Macroglobulin: 189 mg/dL (ref 106–279)
Apolipoprotein A1: 160 mg/dL (ref 101–198)
Bilirubin: 0.4 mg/dL (ref 0.2–1.2)
Fibrosis Score: 0.05
GGT: 16 U/L (ref 3–40)
Haptoglobin: 151 mg/dL (ref 43–212)
Necroinflammat ACT Score: 0.17
Reference ID: 3762454

## 2020-12-20 LAB — HEPATITIS B CORE ANTIBODY, TOTAL: Hep B Core Total Ab: REACTIVE — AB

## 2020-12-20 LAB — HEPATITIS B DNA, ULTRAQUANTITATIVE, PCR
Hepatitis B DNA (Calc): 3.94 Log IU/mL — ABNORMAL HIGH
Hepatitis B DNA: 8780 IU/mL — ABNORMAL HIGH

## 2020-12-20 LAB — HEPATITIS B E ANTIBODY: Hep B E Ab: REACTIVE — AB

## 2020-12-20 LAB — HEPATITIS B SURFACE ANTIGEN: Hepatitis B Surface Ag: REACTIVE — AB

## 2020-12-20 LAB — HEPATITIS A ANTIBODY, TOTAL: Hepatitis A AB,Total: REACTIVE — AB

## 2020-12-20 LAB — HEPATITIS B E ANTIGEN: Hep B E Ag: NONREACTIVE

## 2020-12-20 LAB — HEPATITIS B SURFACE ANTIBODY,QUALITATIVE: Hep B S Ab: NONREACTIVE

## 2020-12-23 ENCOUNTER — Telehealth: Payer: Self-pay

## 2020-12-23 NOTE — Telephone Encounter (Signed)
I called patient to relay lab results.  Patient did not answer. I was able to leave a voicemail on a secured voicemail for patient to call back. Robin Nguyen T [name]

## 2020-12-23 NOTE — Telephone Encounter (Signed)
Patient called back.  I have relayed lab results to the patient. Patient verbalize understanding and will call OBGYN to have tem get her scheduled for the RUQ [place]. \ Patient had no questions or concerns. Kelani Robart T [name]

## 2020-12-23 NOTE — Telephone Encounter (Signed)
-----   Message from Kathlynn Grate, DO sent at 12/22/2020  4:37 PM EST ----- Please let patient know that I have had a chance to review her labs.  She has low level HBV viremia, minimal ALT elevation, and no evidence of fibrosis.  I recommend that we continue to monitor her labs at follow up but do not think she needs antiviral treatment right now.  She should also have RUQ [place] that was previously ordered by her Ob/Gyn.   Thanks,  Greig Castilla

## 2021-01-03 ENCOUNTER — Encounter: Payer: Self-pay | Admitting: *Deleted

## 2021-01-03 ENCOUNTER — Other Ambulatory Visit: Payer: Self-pay

## 2021-01-03 ENCOUNTER — Other Ambulatory Visit (HOSPITAL_COMMUNITY)
Admission: RE | Admit: 2021-01-03 | Discharge: 2021-01-03 | Disposition: A | Discharge status: Home / Self Care | Payer: Medicaid Other | Source: Ambulatory Visit | Attending: Obstetrics and Gynecology | Admitting: Obstetrics and Gynecology

## 2021-01-03 ENCOUNTER — Ambulatory Visit (INDEPENDENT_AMBULATORY_CARE_PROVIDER_SITE_OTHER): Payer: Medicaid Other | Admitting: *Deleted

## 2021-01-03 VITALS — BP 120/78 | HR 80 | Temp 98.0°F | Wt 127.0 lb

## 2021-01-03 DIAGNOSIS — Z113 Encounter for screening for infections with a predominantly sexual mode of transmission: Secondary | ICD-10-CM | POA: Insufficient documentation

## 2021-01-03 DIAGNOSIS — A749 Chlamydial infection, unspecified: Secondary | ICD-10-CM | POA: Insufficient documentation

## 2021-01-03 NOTE — Progress Notes (Signed)
   SUBJECTIVE:  30 y.o. female in clinic for test of cure for chlamydia. Denies abnormal vaginal bleeding or significant pelvic pain or fever. No UTI symptoms. Denies history of known exposure to STD.  No LMP recorded. (Menstrual status: Irregular Periods).  OBJECTIVE:  She appears well, afebrile. Urine dipstick: not done.  ASSESSMENT:  Denies any symptoms   PLAN:  GC, chlamydia, trichomonas, urine sent to lab. Treatment: To be determined once lab results are received ROV prn if symptoms persist or worsen.  Clovis Pu, RN

## 2021-01-04 LAB — URINE CYTOLOGY ANCILLARY ONLY
Chlamydia: NEGATIVE
Comment: NEGATIVE
Comment: NEGATIVE
Comment: NORMAL
Neisseria Gonorrhea: NEGATIVE
Trichomonas: NEGATIVE

## 2021-01-14 DIAGNOSIS — Z419 Encounter for procedure for purposes other than remedying health state, unspecified: Secondary | ICD-10-CM | POA: Diagnosis not present

## 2021-01-29 ENCOUNTER — Other Ambulatory Visit: Payer: Self-pay

## 2021-01-29 ENCOUNTER — Encounter (HOSPITAL_COMMUNITY): Payer: Self-pay

## 2021-01-29 ENCOUNTER — Ambulatory Visit (HOSPITAL_COMMUNITY)
Admission: EM | Admit: 2021-01-29 | Discharge: 2021-01-29 | Disposition: A | Discharge status: Home / Self Care | Payer: Medicaid Other | Attending: Emergency Medicine | Admitting: Emergency Medicine

## 2021-01-29 DIAGNOSIS — R519 Headache, unspecified: Secondary | ICD-10-CM | POA: Diagnosis not present

## 2021-01-29 DIAGNOSIS — J039 Acute tonsillitis, unspecified: Secondary | ICD-10-CM

## 2021-01-29 DIAGNOSIS — J029 Acute pharyngitis, unspecified: Secondary | ICD-10-CM

## 2021-01-29 LAB — POCT RAPID STREP A, ED / UC: Streptococcus, Group A Screen (Direct): NEGATIVE

## 2021-01-29 MED ORDER — AMOXICILLIN-POT CLAVULANATE 875-125 MG PO TABS: 1.0000 | ORAL_TABLET | Freq: Two times a day (BID) | ORAL | 0 refills | Status: AC

## 2021-01-29 NOTE — Discharge Instructions (Addendum)
Take the Augmentin twice a day for the next 7 days.  You can take Tylenol and/or Ibuprofen as needed for fever reduction and pain relief.   For sore throat: try warm salt water gargles, cepacol lozenges, throat spray, warm tea or water with lemon/honey, or OTC cold relief medicine for throat discomfort.    For congestion: take a daily anti-histamine like Zyrtec, Claritin, and a oral decongestant to help with post nasal drip that may be irritating your throat. You can also use Flonase 2 sprays in each nostril daily.    It is important to stay hydrated: drink plenty of fluids (water, gatorade/powerade/pedialyte, juices, or teas) to keep your throat moisturized and help further relieve irritation/discomfort.   Follow-up with your primary care provider or go to the Emergency Department if symptoms worsen or do not improve in the next few days.
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
## 2021-01-29 NOTE — ED Provider Notes (Signed)
[facility]    CSN: [id number] Arrival date & time: 01/29/21  1044      History   Chief Complaint Chief Complaint  Patient presents with  . Sore Throat  . Headache    HPI Robin Nguyen is a 30 y.o. female.   Patient is here for evaluation of his sore throat and headaches that have been ongoing for the past 7 days.  Reports throat is painful to swallow and muffled voice.  Also reports sinus pain and pressure.  Reports having chills but has not actually checked fever at home.  Denies any cough or chest congestion.  Not taking any OTC medications or treatments. Denies any specific alleviating or aggravating factors.  Denies any chest pain, shortness of breath, N/V/D, numbness, tingling, weakness, or abdominal pain.   ROS: As per HPI, all other pertinent ROS negative     Sore Throat Associated symptoms include headaches.  Headache Associated symptoms: sinus pressure and sore throat   Associated symptoms: no cough     Past Medical History:  Diagnosis Date  . Depression   . Gestational thrombocytopenia (HCC)   . Headache   . Hepatitis B   . HSV (herpes simplex virus) anogenital infection   . Migraine   . Placenta previa 01/10/2015    Patient Active Problem List   Diagnosis Date Noted  . Chlamydia infection 12/13/2020  . Need for COVID-19 vaccine 12/13/2020  . Severe episode of recurrent major depressive disorder, without psychotic features (HCC)   . Normal intrauterine pregnancy in third trimester 01/17/2017  . Chronic migraine 08/09/2016  . Depression, major, single episode, moderate (HCC) 05/29/2015  . Status post primary low transverse cesarean section 02/04/2015  . Low lying placenta without hemorrhage, antepartum 02/02/2015  . IUGR (intrauterine growth restriction) 02/02/2015  . Gestational thrombocytopenia without hemorrhage (HCC) 02/02/2015  . Hepatitis B infection 05/01/2014    Past Surgical History:  Procedure Laterality Date  . CESAREAN  SECTION N/A 02/03/2015   Procedure: CESAREAN SECTION;  Surgeon: Myna Hidalgo, DO;  Location: [facility];  Service: Obstetrics;  Laterality: N/A;  . CESAREAN SECTION WITH BILATERAL TUBAL LIGATION N/A 01/17/2017   Procedure: CESAREAN SECTION WITH BILATERAL TUBAL LIGATION;  Surgeon: Myna Hidalgo, DO;  Location: [facility];  Service: Obstetrics;  Laterality: N/A;  EDD 01/24/17    OB History    Gravida  2   Para  2   Term  2   Preterm      AB      Living  2     SAB      IAB      Ectopic      Multiple  0   Live Births  2            Home Medications    Prior to Admission medications   Medication Sig Start Date End Date Taking? Authorizing Provider  amoxicillin-clavulanate (AUGMENTIN) 875-125 MG tablet Take 1 tablet by mouth 2 (two) times daily for 7 days. 01/29/21 02/05/21 Yes Ivette Loyal, NP  cyclobenzaprine (FLEXERIL) 5 MG tablet Take 1 tablet (5 mg total) by mouth 3 (three) times daily as needed for muscle spasms. DO NOT DRINK ALCOHOL OR DRIVE WHILE TAKING THIS MEDICATION Patient not taking: Reported on 12/13/2020 11/03/20   Particia Nearing, PA-C  hydrOXYzine (ATARAX/VISTARIL) 25 MG tablet Take 1 tablet (25 mg total) by mouth 3 (three) times daily as needed for anxiety. Patient not taking: Reported on 12/13/2020 08/25/20   Money, Feliz Beam  B, FNP  megestrol (MEGACE) 40 MG tablet Take 1 tablet (40 mg total) by mouth 2 (two) times daily. 12/01/20   Raelyn Mora, CNM  naproxen (NAPROSYN) 500 MG tablet Take 1 tablet (500 mg total) by mouth 2 (two) times daily as needed. 11/03/20   Particia Nearing, PA-C  Norgestimate-Eth Estradiol (SPRINTEC 28 PO) Take by mouth. Patient not taking: Reported on 12/13/2020    [provider]  sertraline (ZOLOFT) 50 MG tablet Take 1 tablet (50 mg total) by mouth daily. Patient not taking: Reported on 12/13/2020 08/26/20   Money, Gerlene Burdock, FNP  traZODone (DESYREL) 50 MG tablet Take 1 tablet (50 mg total) by mouth at bedtime  as needed for sleep. 08/25/20   Money, Gerlene Burdock, FNP    Family History Family History  Problem Relation Age of Onset  . Hypertension Mother   . Healthy Father     Social History Social History   Tobacco Use  . Smoking status: Never Smoker  . Smokeless tobacco: Never Used  Vaping Use  . Vaping Use: Never used  Substance Use Topics  . Alcohol use: No  . Drug use: No     Allergies   Patient has no known allergies.   Review of Systems Review of Systems  HENT: Positive for sinus pressure and sore throat.   Respiratory: Negative for cough.   Neurological: Positive for headaches.     Physical Exam Triage Vital Signs ED Triage Vitals  Enc Vitals Group     BP 01/29/21 1207 102/83     Pulse Rate 01/29/21 1207 64     Resp 01/29/21 1207 18     Temp 01/29/21 1207 97.6 F (36.4 C)     Temp Source 01/29/21 1207 Oral     SpO2 01/29/21 1207 100 %     Weight --      Height --      Head Circumference --      Peak Flow --      Pain Score 01/29/21 1206 7     Pain Loc --      Pain Edu? --      Excl. in GC? --    No data found.  Updated Vital Signs BP 102/83 (BP Location: Right Arm)   Pulse 64   Temp 97.6 F (36.4 C) (Oral)   Resp 18   LMP 01/28/2021   SpO2 100%   Visual Acuity Right Eye Distance:   Left Eye Distance:   Bilateral Distance:    Right Eye Near:   Left Eye Near:    Bilateral Near:     Physical Exam Vitals and nursing note reviewed.  Constitutional:      General: She is not in acute distress.    Appearance: Normal appearance. She is not ill-appearing, toxic-appearing or diaphoretic.  HENT:     Head: Normocephalic and atraumatic.     Right Ear: Tympanic membrane normal.     Left Ear: Tympanic membrane normal.     Mouth/Throat:     Pharynx: Pharyngeal swelling and posterior oropharyngeal erythema present.     Tonsils: Tonsillar exudate present. 1+ on the right. 1+ on the left.  Eyes:     Conjunctiva/sclera: Conjunctivae normal.   Cardiovascular:     Rate and Rhythm: Normal rate.     Pulses: Normal pulses.  Pulmonary:     Effort: Pulmonary effort is normal.  Abdominal:     General: Abdomen is flat.  Musculoskeletal:  General: Normal range of motion.     Cervical back: Normal range of motion.  Skin:    General: Skin is warm and dry.  Neurological:     General: No focal deficit present.     Mental Status: She is alert and oriented to person, place, and time.  Psychiatric:        Mood and Affect: Mood normal.      UC Treatments / Results  Labs (all labs ordered are listed, but only abnormal results are displayed) Labs Reviewed  CULTURE, GROUP A STREP [facility])  POCT RAPID STREP A, ED / UC    EKG   Radiology No results found.  Procedures Procedures (including critical care time)  Medications Ordered in UC Medications - No data to display  Initial Impression / Assessment and Plan / UC Course  I have reviewed the triage vital signs and the nursing notes.  Pertinent labs & imaging results that were available during my care of the patient were reviewed by me and considered in my medical decision making (see chart for details).    Assessment negative for red flags or concerns including tonsillar abscess.  As symptoms have been ongoing for the past 7 days will cover for sinusitis and tonsillitis with Augmentin twice daily for 7 days.  Continue Tylenol and/or ibuprofen as needed for pain relief and fever reduction.  Discussed supportive measures as described in discharge instructions.  Follow-up with primary care if symptoms do not improve or worsen over the next few days.  Final Clinical Impressions(s) / UC Diagnoses   Final diagnoses:  Sore throat  Acute tonsillitis, unspecified etiology     Discharge Instructions     Take the Augmentin twice a day for the next 7 days.  You can take Tylenol and/or Ibuprofen as needed for fever reduction and pain relief.   For sore throat: try warm  salt water gargles, cepacol lozenges, throat spray, warm tea or water with lemon/honey, or OTC cold relief medicine for throat discomfort.    For congestion: take a daily anti-histamine like Zyrtec, Claritin, and a oral decongestant to help with post nasal drip that may be irritating your throat. You can also use Flonase 2 sprays in each nostril daily.    It is important to stay hydrated: drink plenty of fluids (water, gatorade/powerade/pedialyte, juices, or teas) to keep your throat moisturized and help further relieve irritation/discomfort.   Follow-up with your primary care provider or go to the Emergency Department if symptoms worsen or do not improve in the next few days.      ED Prescriptions    Medication Sig Dispense Auth. Provider   amoxicillin-clavulanate (AUGMENTIN) 875-125 MG tablet Take 1 tablet by mouth 2 (two) times daily for 7 days. 14 tablet Ivette Loyal, NP     PDMP not reviewed this encounter.   Ivette Loyal, NP 01/29/21 1324

## 2021-02-01 LAB — CULTURE, GROUP A STREP (THRC)

## 2021-02-08 ENCOUNTER — Encounter (HOSPITAL_COMMUNITY): Payer: Self-pay | Admitting: Emergency Medicine

## 2021-02-08 ENCOUNTER — Other Ambulatory Visit: Payer: Self-pay

## 2021-02-08 ENCOUNTER — Ambulatory Visit (HOSPITAL_COMMUNITY)
Admission: EM | Admit: 2021-02-08 | Discharge: 2021-02-08 | Disposition: A | Discharge status: Home / Self Care | Payer: Medicaid Other | Attending: Emergency Medicine | Admitting: Emergency Medicine

## 2021-02-08 DIAGNOSIS — N898 Other specified noninflammatory disorders of vagina: Secondary | ICD-10-CM | POA: Insufficient documentation

## 2021-02-08 DIAGNOSIS — L239 Allergic contact dermatitis, unspecified cause: Secondary | ICD-10-CM | POA: Insufficient documentation

## 2021-02-08 DIAGNOSIS — Z3202 Encounter for pregnancy test, result negative: Secondary | ICD-10-CM | POA: Diagnosis not present

## 2021-02-08 LAB — POCT URINALYSIS DIPSTICK, ED / UC
Bilirubin Urine: NEGATIVE
Glucose, UA: NEGATIVE mg/dL
Ketones, ur: NEGATIVE mg/dL
Leukocytes,Ua: NEGATIVE
Nitrite: POSITIVE — AB
Protein, ur: 100 mg/dL — AB
Specific Gravity, Urine: >=1.03 (ref 1.005–1.030)
Urobilinogen, UA: 1 mg/dL (ref 0.0–1.0)
pH: 6.5 (ref 5.0–8.0)

## 2021-02-08 LAB — POC URINE PREG, ED: Preg Test, Ur: NEGATIVE

## 2021-02-08 MED ORDER — HYDROCORTISONE 0.5 % EX OINT: 1.0000 "application " | TOPICAL_OINTMENT | Freq: Two times a day (BID) | CUTANEOUS | 1 refills | Status: DC

## 2021-02-08 NOTE — ED Provider Notes (Signed)
[facility]    CSN: [id number] Arrival date & time: 02/08/21  4627      History   Chief Complaint Chief Complaint  Patient presents with  . Vaginal Itching    HPI Robin Nguyen is a 30 y.o. female.   Patient presents with vaginal itchiness, irritation, dysuria, urinary frequency and pain during sex for 3 days.  1 partner, no condom use.  Denies odor, lesions, fevers, chills, abdominal pain, flank pain. LMP 4/15. Menses is irregular.   Concerns with rash on bilateral lower extremities starting 3 days ago around the same time above symptoms began.  Rash is flesh tone and pruritic. Recently changed to new soap. Denies changes in diet, lotions, detergents. Has not attempted treatment.  Past Medical History:  Diagnosis Date  . Depression   . Gestational thrombocytopenia (HCC)   . Headache   . Hepatitis B   . HSV (herpes simplex virus) anogenital infection   . Migraine   . Placenta previa 01/10/2015    Patient Active Problem List   Diagnosis Date Noted  . Chlamydia infection 12/13/2020  . Need for COVID-19 vaccine 12/13/2020  . Severe episode of recurrent major depressive disorder, without psychotic features (HCC)   . Normal intrauterine pregnancy in third trimester 01/17/2017  . Chronic migraine 08/09/2016  . Depression, major, single episode, moderate (HCC) 05/29/2015  . Status post primary low transverse cesarean section 02/04/2015  . Low lying placenta without hemorrhage, antepartum 02/02/2015  . IUGR (intrauterine growth restriction) 02/02/2015  . Gestational thrombocytopenia without hemorrhage (HCC) 02/02/2015  . Hepatitis B infection 05/01/2014    Past Surgical History:  Procedure Laterality Date  . CESAREAN SECTION N/A 02/03/2015   Procedure: CESAREAN SECTION;  Surgeon: Myna Hidalgo, DO;  Location: [facility];  Service: Obstetrics;  Laterality: N/A;  . CESAREAN SECTION WITH BILATERAL TUBAL LIGATION N/A 01/17/2017   Procedure: CESAREAN SECTION WITH  BILATERAL TUBAL LIGATION;  Surgeon: Myna Hidalgo, DO;  Location: [facility];  Service: Obstetrics;  Laterality: N/A;  EDD 01/24/17    OB History    Gravida  2   Para  2   Term  2   Preterm      AB      Living  2     SAB      IAB      Ectopic      Multiple  0   Live Births  2            Home Medications    Prior to Admission medications   Medication Sig Start Date End Date Taking? Authorizing Provider  cyclobenzaprine (FLEXERIL) 5 MG tablet Take 1 tablet (5 mg total) by mouth 3 (three) times daily as needed for muscle spasms. DO NOT DRINK ALCOHOL OR DRIVE WHILE TAKING THIS MEDICATION Patient not taking: No sig reported 11/03/20   Particia Nearing, PA-C  hydrOXYzine (ATARAX/VISTARIL) 25 MG tablet Take 1 tablet (25 mg total) by mouth 3 (three) times daily as needed for anxiety. Patient not taking: No sig reported 08/25/20   Money, Gerlene Burdock, FNP  megestrol (MEGACE) 40 MG tablet Take 1 tablet (40 mg total) by mouth 2 (two) times daily. 12/01/20   Raelyn Mora, CNM  naproxen (NAPROSYN) 500 MG tablet Take 1 tablet (500 mg total) by mouth 2 (two) times daily as needed. 11/03/20   Particia Nearing, PA-C  Norgestimate-Eth Estradiol (SPRINTEC 28 PO) Take by mouth. Patient not taking: No sig reported    [provider]  sertraline (ZOLOFT) 50 MG tablet Take 1 tablet (50 mg total) by mouth daily. Patient not taking: No sig reported 08/26/20   Money, Gerlene Burdock, FNP  traZODone (DESYREL) 50 MG tablet Take 1 tablet (50 mg total) by mouth at bedtime as needed for sleep. 08/25/20   Money, Gerlene Burdock, FNP    Family History Family History  Problem Relation Age of Onset  . Hypertension Mother   . Healthy Father     Social History Social History   Tobacco Use  . Smoking status: Never Smoker  . Smokeless tobacco: Never Used  Vaping Use  . Vaping Use: Never used  Substance Use Topics  . Alcohol use: No  . Drug use: No     Allergies   Patient  has no known allergies.   Review of Systems Review of Systems  Constitutional: Negative.   Respiratory: Negative.   Cardiovascular: Negative.   Genitourinary: Positive for dysuria, frequency and vaginal pain. Negative for decreased urine volume, difficulty urinating, enuresis, flank pain, genital sores, hematuria, menstrual problem, pelvic pain, urgency, vaginal bleeding and vaginal discharge.  Skin: Positive for rash.  Neurological: Negative.      Physical Exam Triage Vital Signs ED Triage Vitals  Enc Vitals Group     BP 02/08/21 0928 115/81     Pulse Rate 02/08/21 0928 74     Resp 02/08/21 0928 16     Temp 02/08/21 0928 98 F (36.7 C)     Temp Source 02/08/21 0928 Oral     SpO2 02/08/21 0928 98 %     Weight --      Height --      Head Circumference --      Peak Flow --      Pain Score 02/08/21 0927 0     Pain Loc --      Pain Edu? --      Excl. in GC? --    No data found.  Updated Vital Signs BP 115/81 (BP Location: Right Arm)   Pulse 74   Temp 98 F (36.7 C) (Oral)   Resp 16   LMP 01/28/2021   SpO2 98%   Visual Acuity Right Eye Distance:   Left Eye Distance:   Bilateral Distance:    Right Eye Near:   Left Eye Near:    Bilateral Near:     Physical Exam Constitutional:      Appearance: Normal appearance. She is normal weight.  HENT:     Head: Normocephalic.  Eyes:     Extraocular Movements: Extraocular movements intact.  Pulmonary:     Effort: Pulmonary effort is normal.  Musculoskeletal:        General: Normal range of motion.     Cervical back: Normal range of motion.  Skin:    General: Skin is warm and dry.     Coloration: Skin is ashen.     Comments: Diffuse papular rash on bilateral lower legs   Neurological:     Mental Status: She is alert.  Psychiatric:        Mood and Affect: Mood normal.        Behavior: Behavior normal.        Thought Content: Thought content normal.        Judgment: Judgment normal.      UC Treatments /  Results  Labs (all labs ordered are listed, but only abnormal results are displayed) Labs Reviewed - No data to display  EKG   Radiology No  results found.  Procedures Procedures (including critical care time)  Medications Ordered in UC Medications - No data to display  Initial Impression / Assessment and Plan / UC Course  I have reviewed the triage vital signs and the nursing notes.  Pertinent labs & imaging results that were available during my care of the patient were reviewed by me and considered in my medical decision making (see chart for details).  Vaginal irritation  Allergic dermatitis   1. Urinalysis- positive nitrates and hemoglobin, sent to culture 2. Urine pregnancy- negative 3. sti screening- pending, will notify if positive, advised abstinence until labs result and/or treatment complete 4. hydrocortisone cream bid, advised moisturizing all skin with lotion or petroleum based product 5. Advised no longer using current soap, changing back to prior product   Final Clinical Impressions(s) / UC Diagnoses   Final diagnoses:  None   Discharge Instructions   None    ED Prescriptions    None     PDMP not reviewed this encounter.   Valinda Hoar, NP 02/08/21 1012

## 2021-02-08 NOTE — ED Triage Notes (Signed)
Patient presents to [facility] for evaluation of vaginal itchiness, pain during sex, and burning x 3 days.  Denies discharge.

## 2021-02-08 NOTE — Discharge Instructions (Signed)
Labs pending 2-3 days, will be called if positive for treatment  Do not have sex until labs result, if positive do not have until treatment complete, please notify partner if positive so they may treated as well  Can apply hydrocortisone cream twice a day to affected areas  Moisturizer all skin daily with lotion or petroleum based product

## 2021-02-09 ENCOUNTER — Emergency Department (HOSPITAL_COMMUNITY)
Admission: EM | Admit: 2021-02-09 | Discharge: 2021-02-09 | Disposition: A | Discharge status: Home / Self Care | Payer: Medicaid Other | Attending: Emergency Medicine | Admitting: Emergency Medicine

## 2021-02-09 ENCOUNTER — Encounter (HOSPITAL_COMMUNITY): Payer: Self-pay | Admitting: Emergency Medicine

## 2021-02-09 DIAGNOSIS — A749 Chlamydial infection, unspecified: Secondary | ICD-10-CM | POA: Diagnosis not present

## 2021-02-09 DIAGNOSIS — N760 Acute vaginitis: Secondary | ICD-10-CM

## 2021-02-09 DIAGNOSIS — B9689 Other specified bacterial agents as the cause of diseases classified elsewhere: Secondary | ICD-10-CM | POA: Insufficient documentation

## 2021-02-09 DIAGNOSIS — N898 Other specified noninflammatory disorders of vagina: Secondary | ICD-10-CM | POA: Diagnosis present

## 2021-02-09 DIAGNOSIS — B373 Candidiasis of vulva and vagina: Secondary | ICD-10-CM

## 2021-02-09 DIAGNOSIS — B3731 Acute candidiasis of vulva and vagina: Secondary | ICD-10-CM

## 2021-02-09 LAB — CERVICOVAGINAL ANCILLARY ONLY
Bacterial Vaginitis (gardnerella): POSITIVE — AB
Candida Glabrata: NEGATIVE
Candida Vaginitis: POSITIVE — AB
Chlamydia: POSITIVE — AB
Comment: NEGATIVE
Comment: NEGATIVE
Comment: NEGATIVE
Comment: NEGATIVE
Comment: NEGATIVE
Comment: NORMAL
Neisseria Gonorrhea: NEGATIVE
Trichomonas: NEGATIVE

## 2021-02-09 MED ORDER — FLUCONAZOLE 200 MG PO TABS: 200.0000 mg | ORAL_TABLET | Freq: Every day | ORAL | 0 refills | Status: AC

## 2021-02-09 MED ORDER — CEFTRIAXONE SODIUM 500 MG IJ SOLR
250.0000 mg | Freq: Once | INTRAMUSCULAR | Status: AC
  Administered 2021-02-09: 250 mg via INTRAMUSCULAR
  Filled 2021-02-09: qty 500

## 2021-02-09 MED ORDER — DOXYCYCLINE HYCLATE 100 MG PO CAPS: 100.0000 mg | ORAL_CAPSULE | Freq: Two times a day (BID) | ORAL | 0 refills | Status: DC

## 2021-02-09 MED ORDER — LIDOCAINE HCL (PF) 1 % IJ SOLN
INTRAMUSCULAR | Status: AC
  Filled 2021-02-09: qty 5

## 2021-02-09 MED ORDER — METRONIDAZOLE 500 MG PO TABS: 500.0000 mg | ORAL_TABLET | Freq: Two times a day (BID) | ORAL | 0 refills | Status: DC

## 2021-02-09 NOTE — ED Provider Notes (Signed)
[facility] EMERGENCY DEPARTMENT Provider Note   CSN: [id number] Arrival date & time: 02/09/21  1744     History No chief complaint on file.   Robin Nguyen is a 30 y.o. female.  30 yo female with vaginal itching x 3-4 days, reports thick white discharge, no recent antibiotics. Seen at [facility] yesterday for same, tested positive for chlamydia, yeast, BV. Has not been treated. Reports new sexual partner. No fevers, no pelvic pain.         Past Medical History:  Diagnosis Date  . Depression   . Gestational thrombocytopenia (HCC)   . Headache   . Hepatitis B   . HSV (herpes simplex virus) anogenital infection   . Migraine   . Placenta previa 01/10/2015    Patient Active Problem List   Diagnosis Date Noted  . Chlamydia infection 12/13/2020  . Need for COVID-19 vaccine 12/13/2020  . Severe episode of recurrent major depressive disorder, without psychotic features (HCC)   . Normal intrauterine pregnancy in third trimester 01/17/2017  . Chronic migraine 08/09/2016  . Depression, major, single episode, moderate (HCC) 05/29/2015  . Status post primary low transverse cesarean section 02/04/2015  . Low lying placenta without hemorrhage, antepartum 02/02/2015  . IUGR (intrauterine growth restriction) 02/02/2015  . Gestational thrombocytopenia without hemorrhage (HCC) 02/02/2015  . Hepatitis B infection 05/01/2014    Past Surgical History:  Procedure Laterality Date  . CESAREAN SECTION N/A 02/03/2015   Procedure: CESAREAN SECTION;  Surgeon: Myna Hidalgo, DO;  Location: [facility];  Service: Obstetrics;  Laterality: N/A;  . CESAREAN SECTION WITH BILATERAL TUBAL LIGATION N/A 01/17/2017   Procedure: CESAREAN SECTION WITH BILATERAL TUBAL LIGATION;  Surgeon: Myna Hidalgo, DO;  Location: [facility];  Service: Obstetrics;  Laterality: N/A;  EDD 01/24/17     OB History    Gravida  2   Para  2   Term  2   Preterm      AB      Living  2     SAB       IAB      Ectopic      Multiple  0   Live Births  2           Family History  Problem Relation Age of Onset  . Hypertension Mother   . Healthy Father     Social History   Tobacco Use  . Smoking status: Never Smoker  . Smokeless tobacco: Never Used  Vaping Use  . Vaping Use: Never used  Substance Use Topics  . Alcohol use: No  . Drug use: No    Home Medications Prior to Admission medications   Medication Sig Start Date End Date Taking? Authorizing Provider  doxycycline (VIBRAMYCIN) 100 MG capsule Take 1 capsule (100 mg total) by mouth 2 (two) times daily. 02/09/21  Yes Jeannie Fend, PA-C  fluconazole (DIFLUCAN) 200 MG tablet Take 1 tablet (200 mg total) by mouth daily for 3 days. 02/09/21 02/12/21 Yes Jeannie Fend, PA-C  metroNIDAZOLE (FLAGYL) 500 MG tablet Take 1 tablet (500 mg total) by mouth 2 (two) times daily. 02/09/21  Yes Jeannie Fend, PA-C  cyclobenzaprine (FLEXERIL) 5 MG tablet Take 1 tablet (5 mg total) by mouth 3 (three) times daily as needed for muscle spasms. DO NOT DRINK ALCOHOL OR DRIVE WHILE TAKING THIS MEDICATION Patient not taking: No sig reported 11/03/20   Particia Nearing, PA-C  hydrocortisone ointment 0.5 % Apply 1 application topically 2 (  two) times daily. 02/08/21   Valinda Hoar, NP  hydrOXYzine (ATARAX/VISTARIL) 25 MG tablet Take 1 tablet (25 mg total) by mouth 3 (three) times daily as needed for anxiety. Patient not taking: No sig reported 08/25/20   Money, Gerlene Burdock, FNP  megestrol (MEGACE) 40 MG tablet Take 1 tablet (40 mg total) by mouth 2 (two) times daily. 12/01/20   Raelyn Mora, CNM  naproxen (NAPROSYN) 500 MG tablet Take 1 tablet (500 mg total) by mouth 2 (two) times daily as needed. 11/03/20   Particia Nearing, PA-C  Norgestimate-Eth Estradiol (SPRINTEC 28 PO) Take by mouth. Patient not taking: No sig reported    [provider]  sertraline (ZOLOFT) 50 MG tablet Take 1 tablet (50 mg total) by mouth  daily. Patient not taking: No sig reported 08/26/20   Money, Gerlene Burdock, FNP  traZODone (DESYREL) 50 MG tablet Take 1 tablet (50 mg total) by mouth at bedtime as needed for sleep. 08/25/20   Money, Gerlene Burdock, FNP    Allergies    Patient has no known allergies.  Review of Systems   Review of Systems  Constitutional: Negative for fever.  Gastrointestinal: Negative for abdominal pain, nausea and vomiting.  Genitourinary: Positive for dysuria and vaginal discharge. Negative for pelvic pain.  Musculoskeletal: Negative for back pain.  Skin: Negative for rash and wound.  Allergic/Immunologic: Negative for immunocompromised state.  Hematological: Negative for adenopathy.  Psychiatric/Behavioral: Negative for confusion.  All other systems reviewed and are negative.   Physical Exam Updated Vital Signs BP 116/76 (BP Location: Left Arm)   Pulse 80   Temp 97.7 F (36.5 C) (Oral)   Resp (!) 24   LMP 01/28/2021   SpO2 100%   Physical Exam Vitals and nursing note reviewed.  Constitutional:      General: She is not in acute distress.    Appearance: She is well-developed. She is not diaphoretic.  HENT:     Head: Normocephalic and atraumatic.  Pulmonary:     Effort: Pulmonary effort is normal.  Abdominal:     Palpations: Abdomen is soft.     Tenderness: There is no abdominal tenderness.  Skin:    General: Skin is warm and dry.     Findings: No erythema or rash.  Neurological:     Mental Status: She is alert and oriented to person, place, and time.  Psychiatric:        Behavior: Behavior normal.     ED Results / Procedures / Treatments   Labs (all labs ordered are listed, but only abnormal results are displayed) Labs Reviewed - No data to display  EKG None  Radiology No results found.  Procedures Procedures   Medications Ordered in ED Medications  cefTRIAXone (ROCEPHIN) injection 250 mg (has no administration in time range)    ED Course  I have reviewed the triage  vital signs and the nursing notes.  Pertinent labs & imaging results that were available during my care of the patient were reviewed by me and considered in my medical decision making (see chart for details).  Clinical Course as of 02/09/21 1837  Wed Feb 09, 2021  7320 31 year old female with new diagnosis chlamydia, yeast, BV, per UC records from yesterday. Pt was not aware of her results, not taking any meds. Pelvic deferred, no concerns for PID. Will treat with Rocephin, Flagyl, Doxy, Diflucan.  Recheck with GYN.  [LM]    Clinical Course User Index [LM] Jeannie Fend, PA-C  MDM Rules/Calculators/A&P                          Final Clinical Impression(s) / ED Diagnoses Final diagnoses:  Chlamydia  Bacterial vaginosis  Vaginal yeast infection    Rx / DC Orders ED Discharge Orders         Ordered    fluconazole (DIFLUCAN) 200 MG tablet  Daily        02/09/21 1831    doxycycline (VIBRAMYCIN) 100 MG capsule  2 times daily        02/09/21 1831    metroNIDAZOLE (FLAGYL) 500 MG tablet  2 times daily        02/09/21 1836           Jeannie Fend, PA-C 02/09/21 [facility]    Eber Hong, MD 02/19/21 1436

## 2021-02-09 NOTE — Discharge Instructions (Addendum)
Your partner(s) will need to go to the health department to get treat. NO SEX for 10 days (including the 10 days after your partner is treated).   There are many options for quick evaluation and management of certain GYN issues that do not require a long wait time or big bill from the emergency department.   Consider these options for your care in the future:   Walk-ins for certain complaints available at:   [facility] [address]  [phone]  See hours at [email or web address]   [facility] [employer] [facility] [facility]  [address]  [phone]   [facility] [employer] [facility] [employer]  [phone]   You can make an appointment to see a GYN provider:   [facility] for [facility] at [facility]  [address]  [phone]   [facility] at [facility]  [address] [employer]  [phone]   [facility] for [facility] at [facility] [place]  [phone]   [facility] for [facility] at [facility]  [address]  [phone]   If you already have an established OB/GYN provider in the area you can make an appointment with them as well.

## 2021-02-09 NOTE — ED Triage Notes (Signed)
Pt  Was UC  Yesterday with c/o vag itching and discharge , pt post ive for STD yeast and BV

## 2021-02-10 LAB — URINE CULTURE: Culture: >=100000 — AB

## 2021-02-13 DIAGNOSIS — Z419 Encounter for procedure for purposes other than remedying health state, unspecified: Secondary | ICD-10-CM | POA: Diagnosis not present

## 2021-02-14 ENCOUNTER — Ambulatory Visit: Payer: Medicaid Other

## 2021-03-15 ENCOUNTER — Ambulatory Visit: Payer: Medicaid Other | Admitting: Internal Medicine

## 2021-03-15 NOTE — Progress Notes (Deleted)
[facility]  CHIEF COMPLAINT:    Follow up for hepatitis B  SUBJECTIVE:    Robin Nguyen is a 30 y.o. female with PMHx as below who presents to the clinic for hepatitis B.   Patient was previously seen by me in February 2022.  At that time she was found to have a low level HBV viremia (8780 copies), AST 35, ALT 45.  Her fibrosis score was F0.  The plan was for continued monitoring of her labs at follow-up but antiviral treatment was not indicated at this time.  She has a right upper quadrant ultrasound that has previously been ordered but not obtained as of yet.    During the interval time period of seeing me she has been evaluated in the ER on 3 occasions.  The first was on 01/29/2021 for sore throat and headache.  She was diagnosed with sinusitis and tonsillitis treated with Augmentin x7 days.  She was then seen again 10 days later at urgent care for vaginal itching, dysuria, and urinary frequency with painful intercourse.  She also endorsed a rash.  She was given a steroid cream for her rash and labs were obtained.  She followed up the next day at the [facility] emergency department after labs were positive for chlamydia, bacterial vaginosis, and yeast.  She was given prescriptions for doxycycline, fluconazole, metronidazole as well as a dose of ceftriaxone 250 mg after her urine culture returned positive for greater than 100,000 colonies of E. coli.  No testing was done for HIV or syphilis.  Please see A&P for the details of today's visit and status of the patient's medical problems.   Patient's Medications  New Prescriptions   No medications on file  Previous Medications   CYCLOBENZAPRINE (FLEXERIL) 5 MG TABLET    Take 1 tablet (5 mg total) by mouth 3 (three) times daily as needed for muscle spasms. DO NOT DRINK ALCOHOL OR DRIVE WHILE TAKING THIS MEDICATION   DOXYCYCLINE (VIBRAMYCIN) 100 MG CAPSULE    Take 1 capsule (100 mg total) by mouth 2 (two)  times daily.   HYDROCORTISONE OINTMENT 0.5 %    Apply 1 application topically 2 (two) times daily.   HYDROXYZINE (ATARAX/VISTARIL) 25 MG TABLET    Take 1 tablet (25 mg total) by mouth 3 (three) times daily as needed for anxiety.   MEGESTROL (MEGACE) 40 MG TABLET    Take 1 tablet (40 mg total) by mouth 2 (two) times daily.   METRONIDAZOLE (FLAGYL) 500 MG TABLET    Take 1 tablet (500 mg total) by mouth 2 (two) times daily.   NAPROXEN (NAPROSYN) 500 MG TABLET    Take 1 tablet (500 mg total) by mouth 2 (two) times daily as needed.   NORGESTIMATE-ETH ESTRADIOL (SPRINTEC 28 PO)    Take by mouth.   SERTRALINE (ZOLOFT) 50 MG TABLET    Take 1 tablet (50 mg total) by mouth daily.   TRAZODONE (DESYREL) 50 MG TABLET    Take 1 tablet (50 mg total) by mouth at bedtime as needed for sleep.  Modified Medications   No medications on file  Discontinued Medications   No medications on file      Past Medical History:  Diagnosis Date  . Depression   . Gestational thrombocytopenia (HCC)   . Headache   . Hepatitis B   . HSV (herpes simplex virus) anogenital infection   . Migraine   . Placenta previa 01/10/2015  Social History   Tobacco Use  . Smoking status: Never Smoker  . Smokeless tobacco: Never Used  Vaping Use  . Vaping Use: Never used  Substance Use Topics  . Alcohol use: No  . Drug use: No    Family History  Problem Relation Age of Onset  . Hypertension Mother   . Healthy Father     No Known Allergies  ROS   OBJECTIVE:    There were no vitals filed for this visit. There is no height or weight on file to calculate BMI.  Physical Exam   Labs and Microbiology: CBC Latest Ref Rng & Units 12/04/2020 08/25/2020 08/21/2019  WBC 4.0 - 10.5 K/uL 5.8 4.9 7.6  Hemoglobin 12.0 - 15.0 g/dL 62.8 31.5 17.6  Hematocrit 36.0 - 46.0 % 39.9 42.1 40.0  Platelets 150 - 400 K/uL 173 156 177   CMP Latest Ref Rng & Units 12/13/2020 12/13/2020 12/04/2020  Glucose 65 - 99 mg/dL 83 - 89  BUN 6 -  20 mg/dL 12 - 11  Creatinine 1.60 - 1.00 mg/dL 7.37 - 1.06(Y)  Sodium 134 - 144 mmol/L 141 - 139  Potassium 3.5 - 5.2 mmol/L 4.1 - 3.7  Chloride 96 - 106 mmol/L 108(H) - 108  CO2 20 - 29 mmol/L 17(L) - 20(L)  Calcium 8.7 - 10.2 mg/dL 9.7 - 9.5  Total Protein 6.0 - 8.5 g/dL 8.0 - 7.6  Total Bilirubin 0.0 - 1.2 mg/dL 0.2 - 0.5  Alkaline Phos 44 - 121 IU/L 56 - 36(L)  AST 0 - 40 IU/L 35 - 28  ALT 6 - 29 U/L 42(H) 45(H) 29     No results found for this or any previous visit (from the past 240 hour(s)).  Imaging: ***   ASSESSMENT & PLAN:    No problem-specific Assessment & Plan notes found for this encounter.   No orders of the defined types were placed in this encounter.    There are no diagnoses linked to this encounter.  ***   Vedia Coffer for Infectious Disease [facility] 03/15/2021, 10:05 AM

## 2021-03-16 DIAGNOSIS — Z419 Encounter for procedure for purposes other than remedying health state, unspecified: Secondary | ICD-10-CM | POA: Diagnosis not present

## 2021-04-15 DIAGNOSIS — Z419 Encounter for procedure for purposes other than remedying health state, unspecified: Secondary | ICD-10-CM | POA: Diagnosis not present

## 2021-05-16 DIAGNOSIS — Z419 Encounter for procedure for purposes other than remedying health state, unspecified: Secondary | ICD-10-CM | POA: Diagnosis not present

## 2021-06-16 DIAGNOSIS — Z419 Encounter for procedure for purposes other than remedying health state, unspecified: Secondary | ICD-10-CM | POA: Diagnosis not present

## 2021-06-25 IMAGING — CR DG SHOULDER 2+V*L*
3 series · 3 of 3 positions shown · non-contrast
Comparison: None.

CLINICAL DATA: Pain

EXAM:
LEFT SHOULDER - 2+ VIEW

[w shoulder external left]
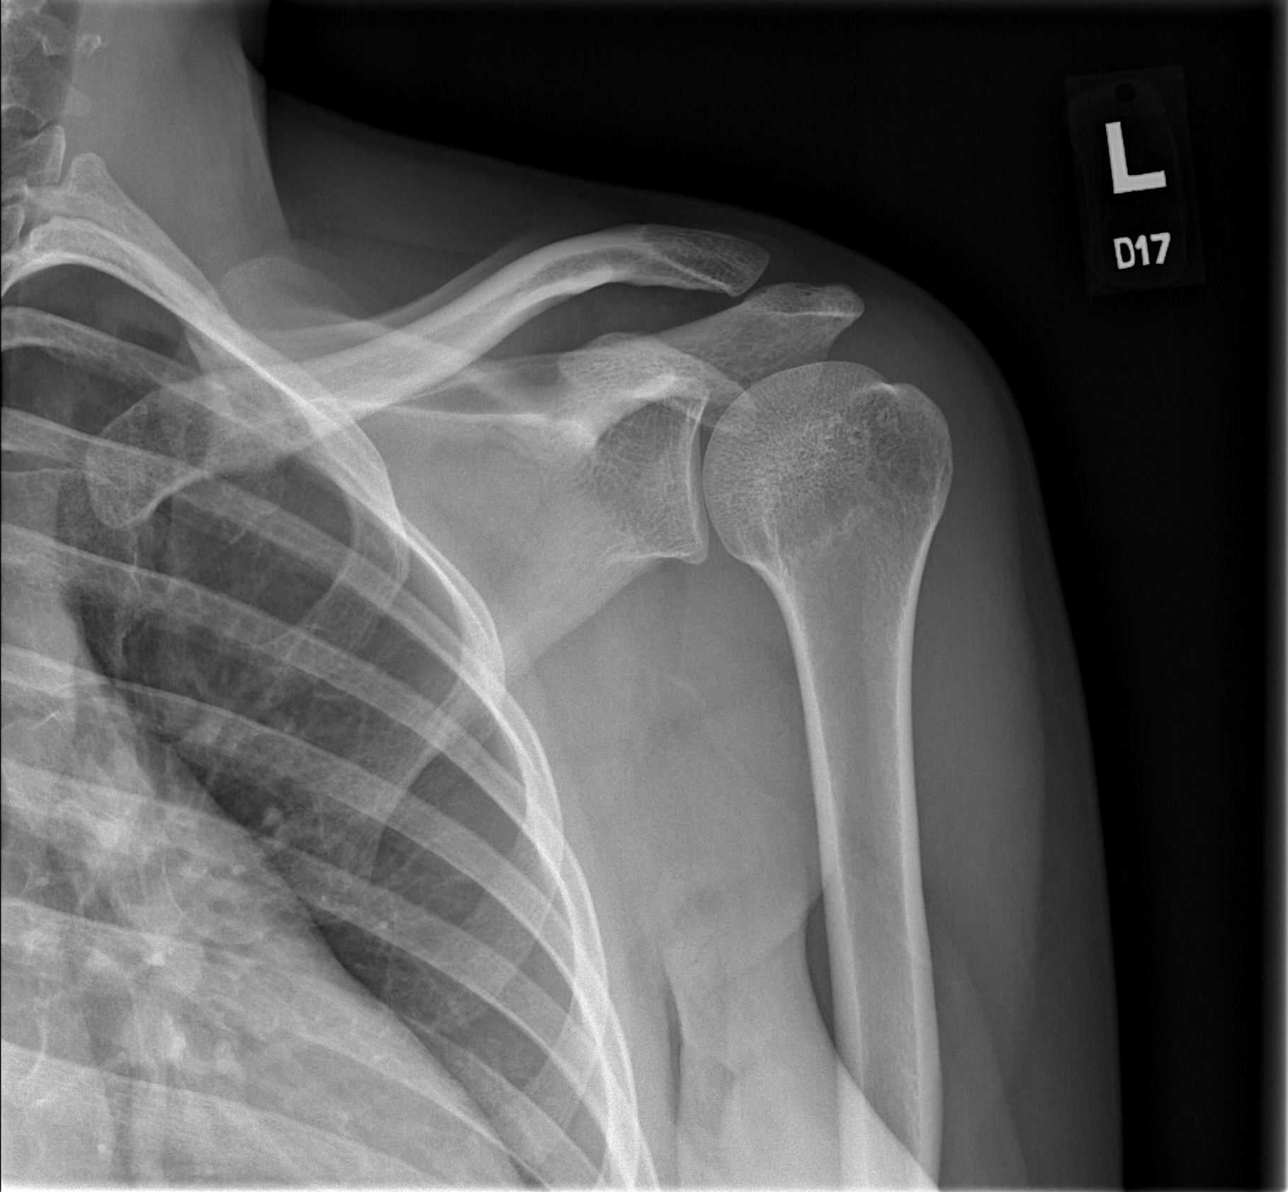

[w shoulder y-view left]
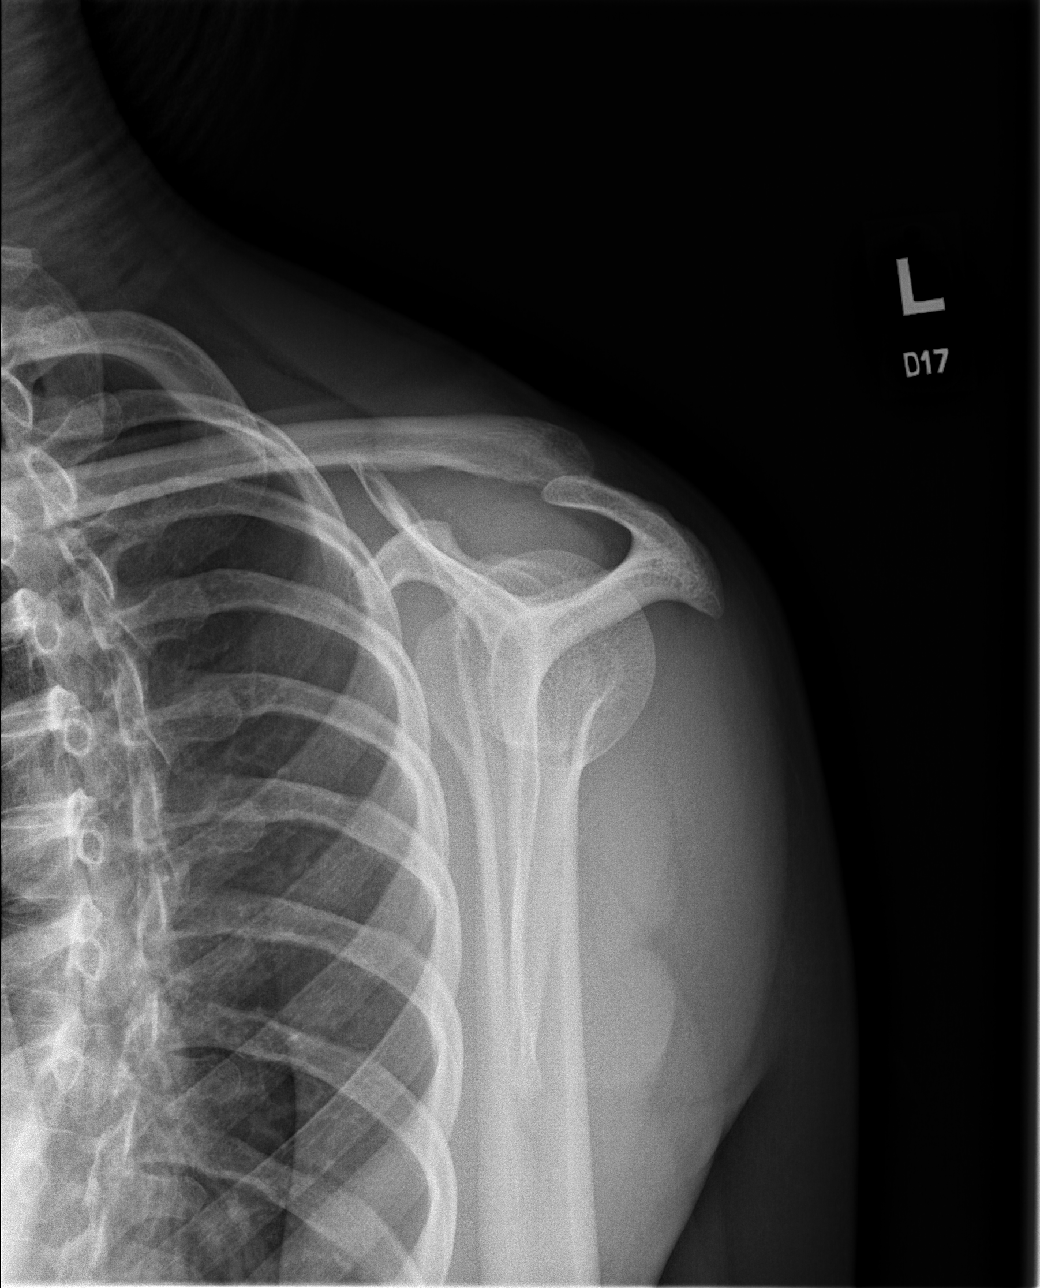

[x shoulder axillary left]
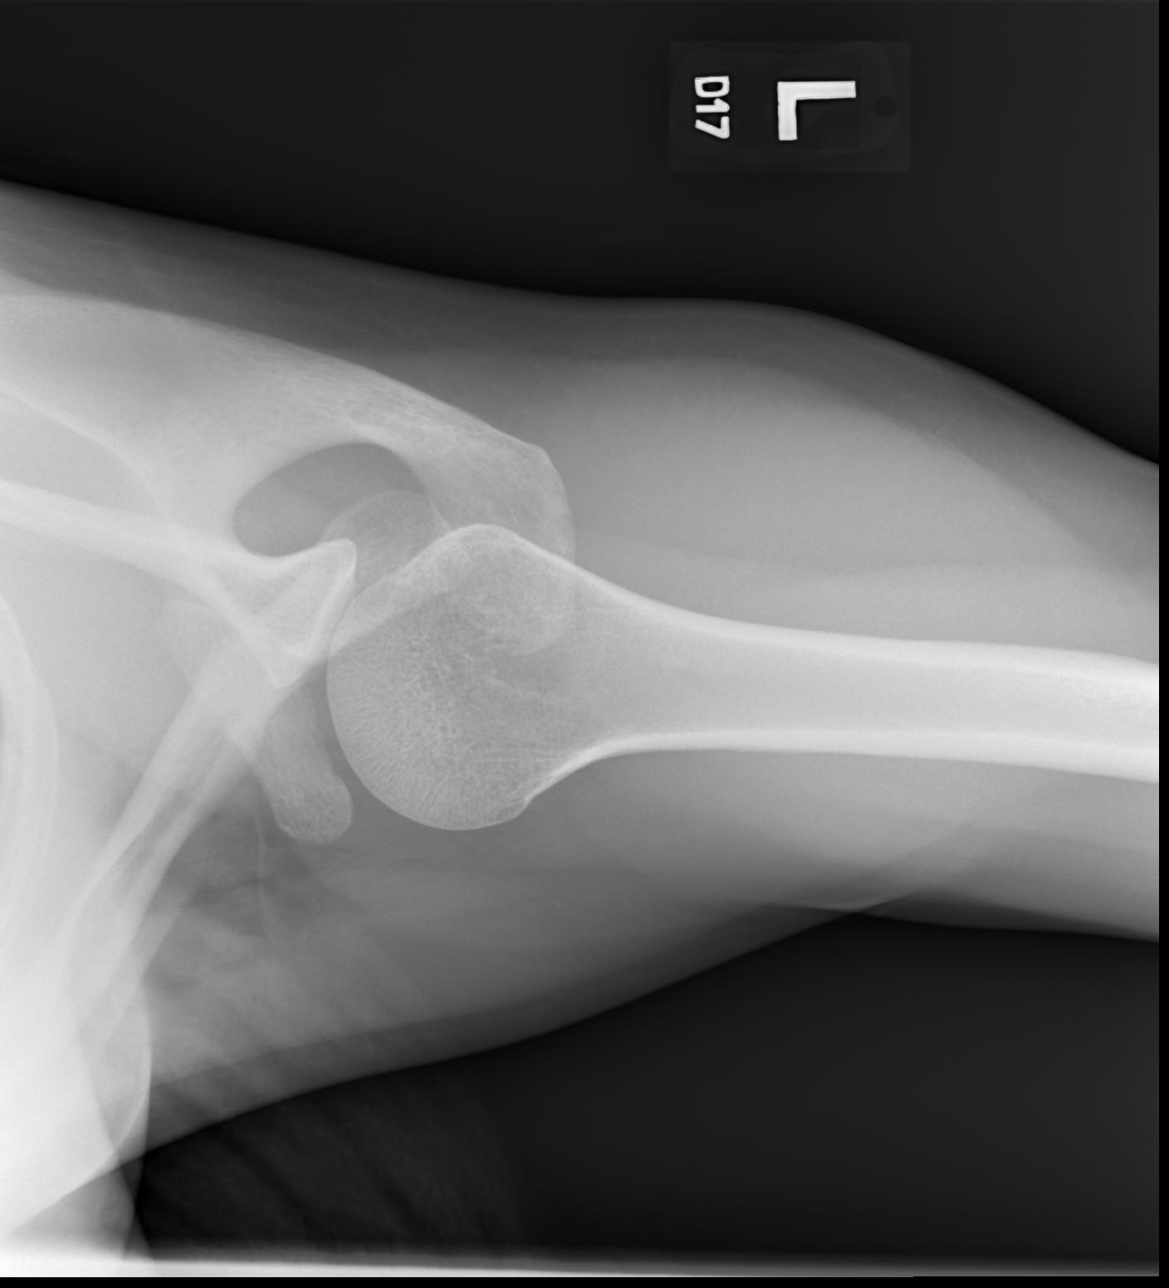

[3 of 3 positions shown; findings below may reference images not displayed]

FINDINGS: There is no evidence of fracture or dislocation. There is no
evidence of arthropathy or other focal bone abnormality. Soft
tissues are unremarkable.
IMPRESSION: Negative.

## 2021-07-16 DIAGNOSIS — Z419 Encounter for procedure for purposes other than remedying health state, unspecified: Secondary | ICD-10-CM | POA: Diagnosis not present

## 2021-08-16 DIAGNOSIS — Z419 Encounter for procedure for purposes other than remedying health state, unspecified: Secondary | ICD-10-CM | POA: Diagnosis not present

## 2021-09-15 DIAGNOSIS — Z419 Encounter for procedure for purposes other than remedying health state, unspecified: Secondary | ICD-10-CM | POA: Diagnosis not present

## 2021-10-16 DIAGNOSIS — Z419 Encounter for procedure for purposes other than remedying health state, unspecified: Secondary | ICD-10-CM | POA: Diagnosis not present

## 2021-11-16 DIAGNOSIS — Z419 Encounter for procedure for purposes other than remedying health state, unspecified: Secondary | ICD-10-CM | POA: Diagnosis not present

## 2021-11-26 IMAGING — US US PELVIS COMPLETE WITH TRANSVAGINAL
1 series · 15 of 25 positions shown · non-contrast
Comparison: None

CLINICAL DATA: Abnormal uterine bleeding

EXAM:
TRANSABDOMINAL AND TRANSVAGINAL ULTRASOUND OF PELVIS
TECHNIQUE: Both transabdominal and transvaginal ultrasound examinations of the
pelvis were performed. Transabdominal technique was performed for
global imaging of the pelvis including uterus, ovaries, adnexal
regions, and pelvic cul-de-sac. It was necessary to proceed with
endovaginal exam following the transabdominal exam to visualize the
uterus endometrium ovaries.

[Series 1: us pelvis complete with transvaginal · 90 acquisitions, 15 frames shown]
[im 1/90]
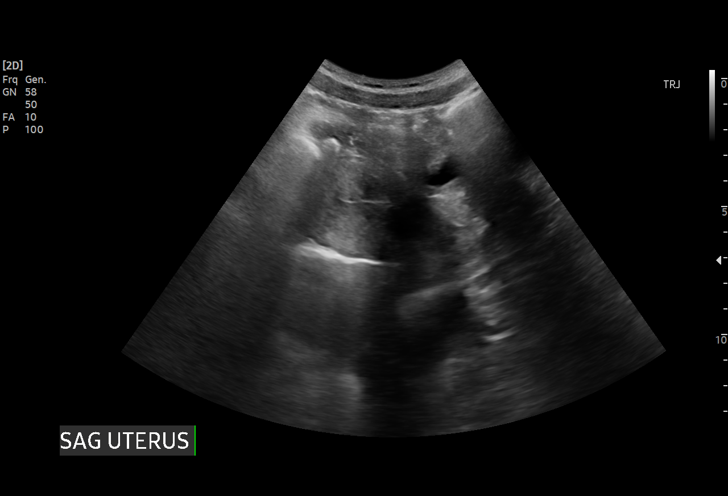
[im 8/90]
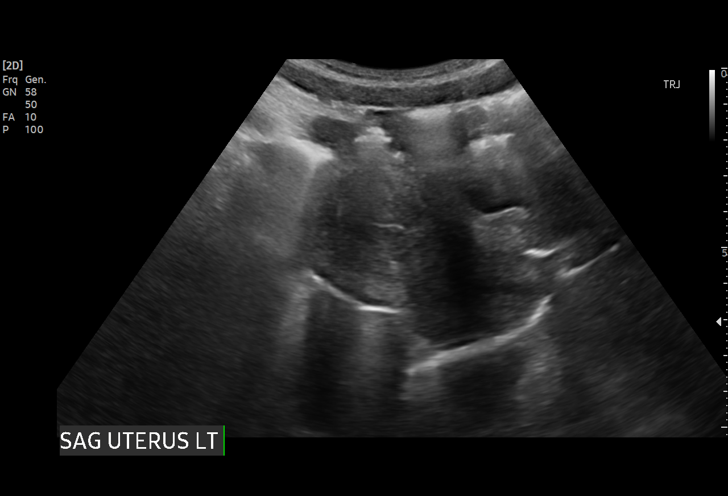
[im 15/90]
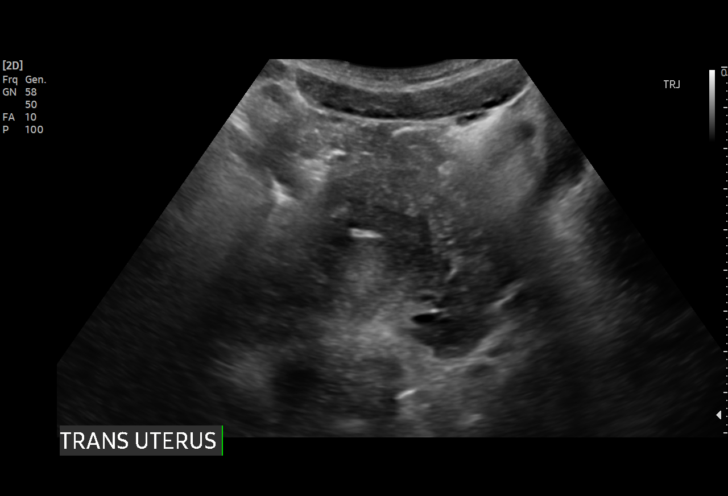
[im 19/90]
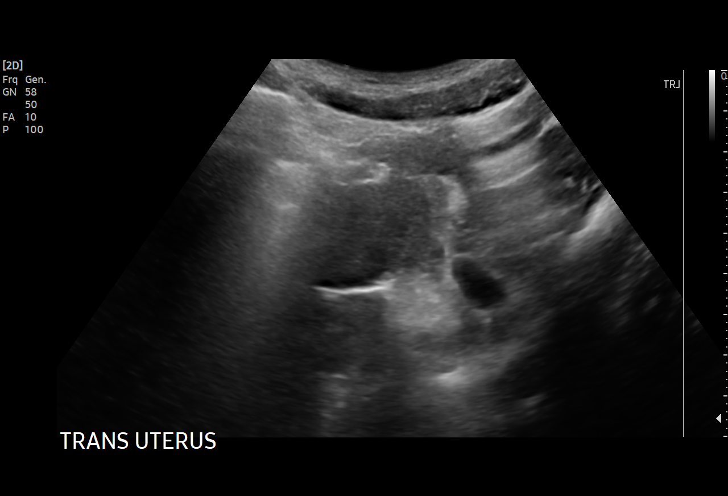
[im 26/90]
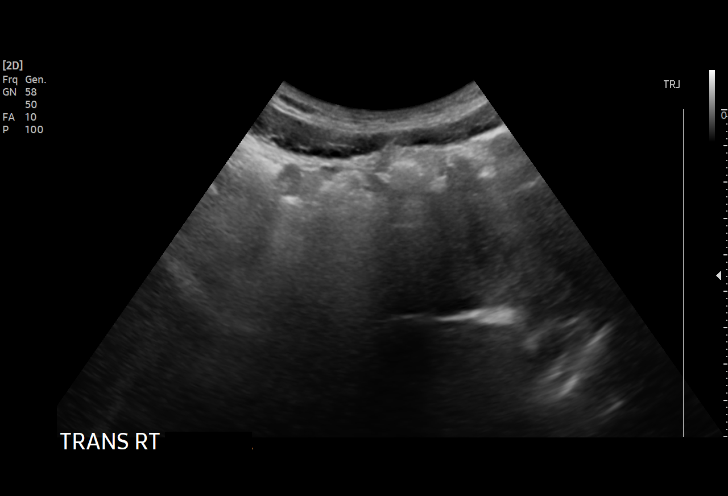
[im 34/90]
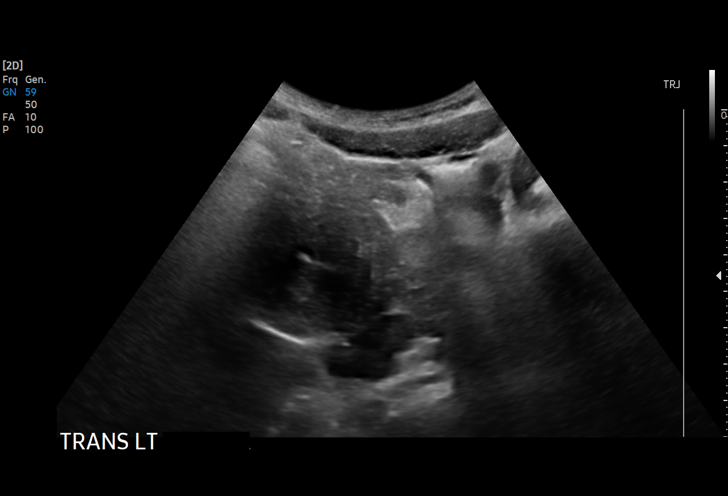
[im 38/90]
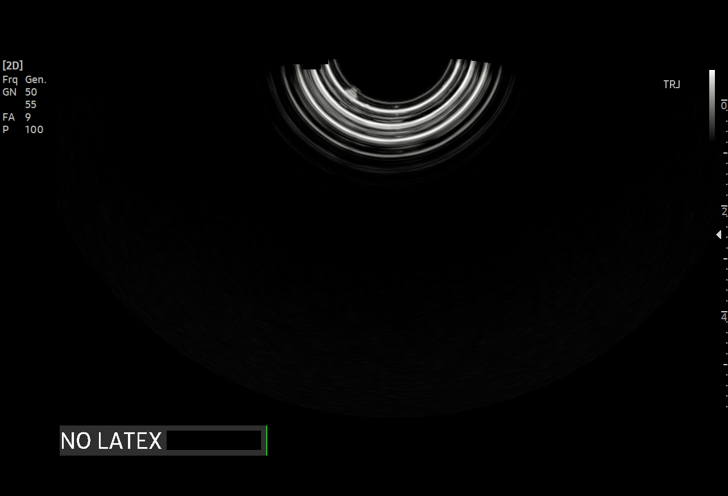
[im 45/90]
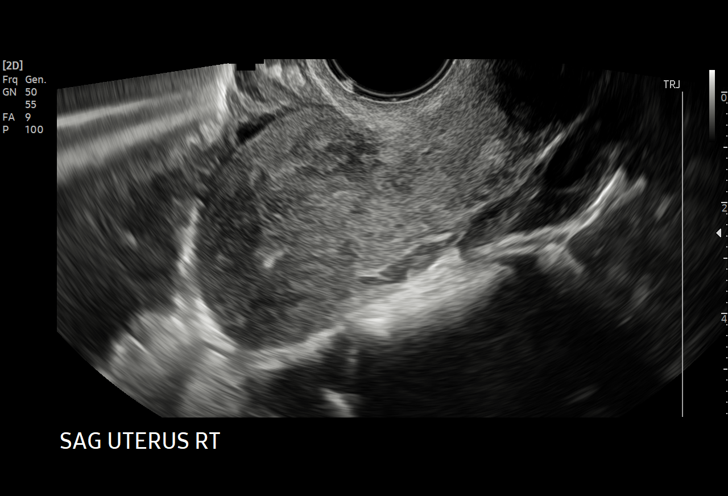
[im 52/90]
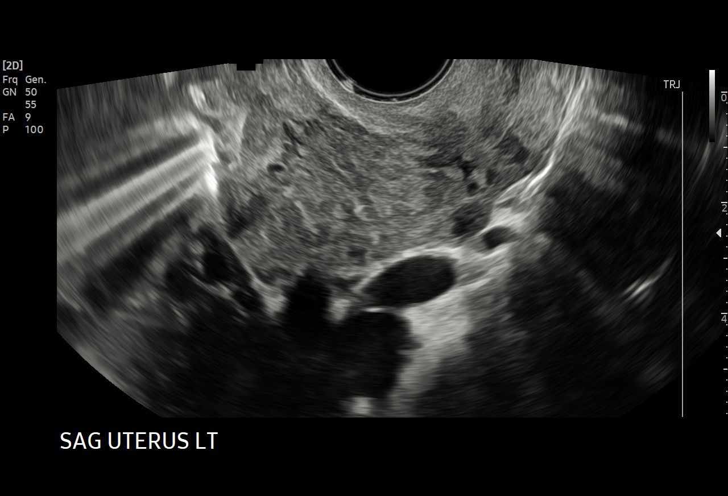
[im 56/90]
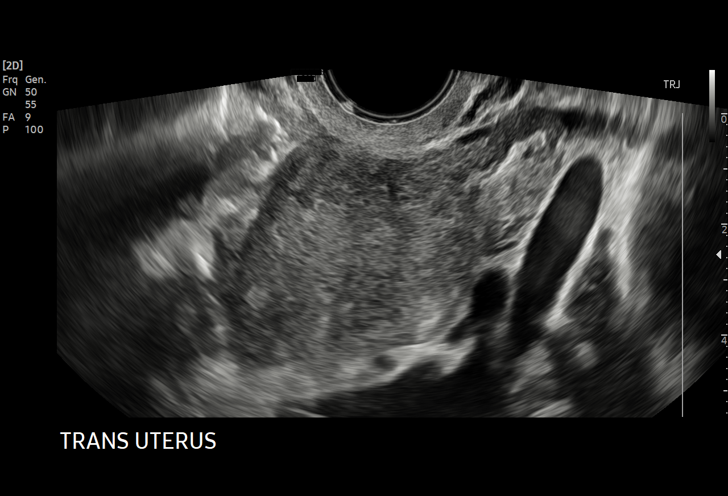
[im 64/90]
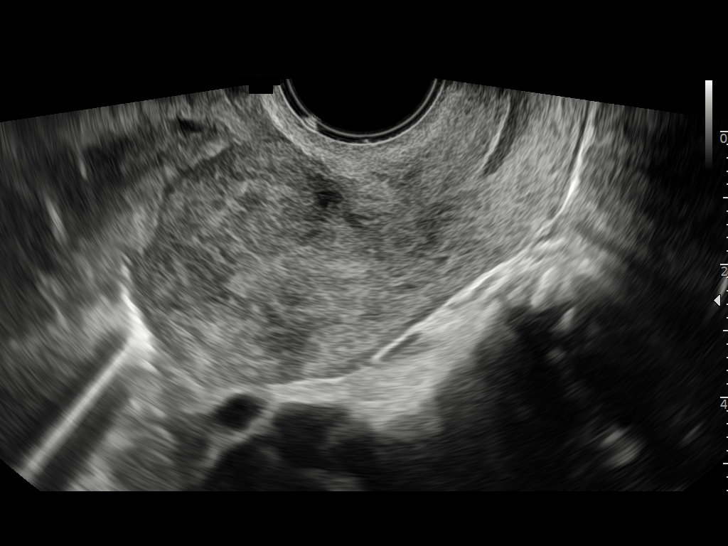
[im 71/90]
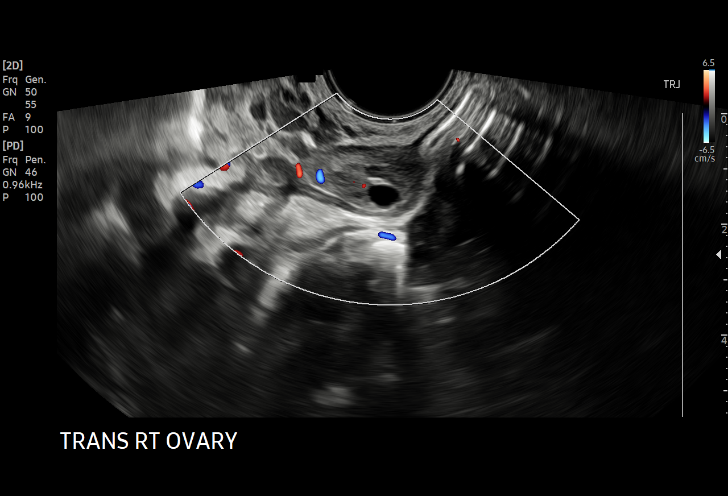
[im 75/90]
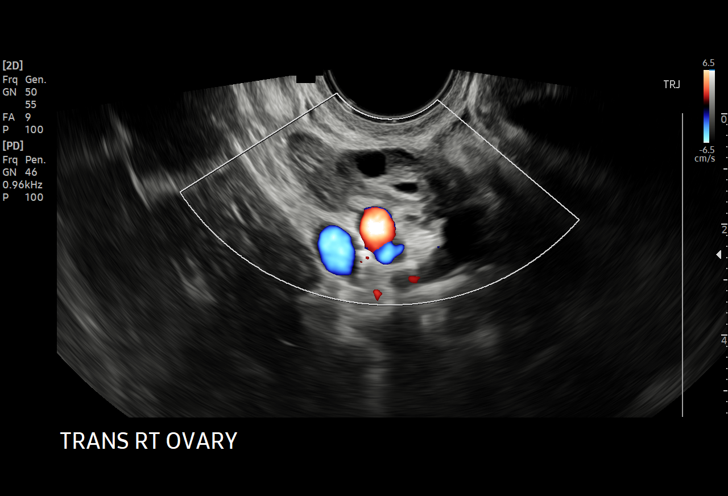
[im 82/90]
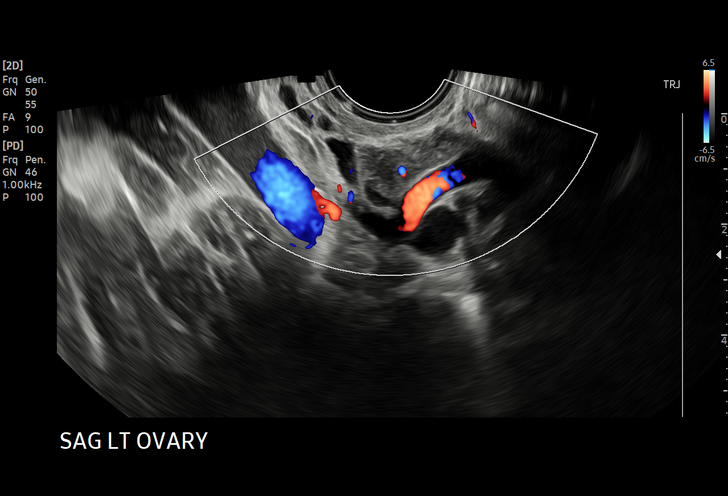
[im 90/90]
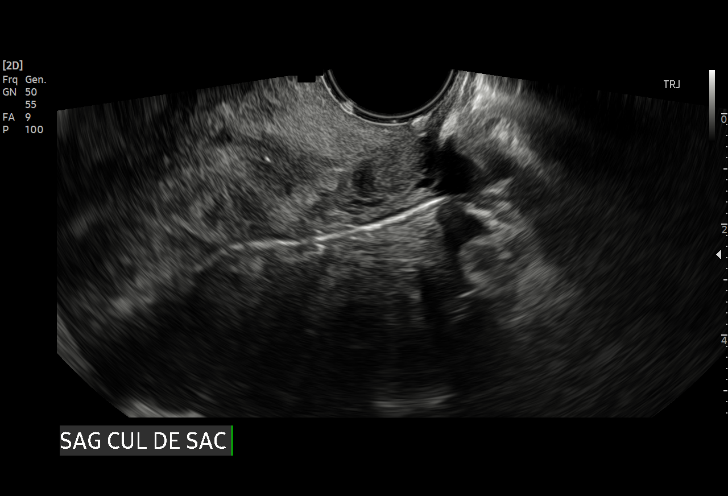

[15 of 25 positions shown; findings below may reference images not displayed]

FINDINGS: Uterus

Measurements: 8.7 x 3.8 x 4.6 cm = volume: 78.2 mL. No fibroids or
other mass visualized.

Endometrium

Thickness: 4.8 mm.  No focal abnormality visualized.

Right ovary

Measurements: 3.8 x 1.5 x 2.1 cm = volume: 6.3 mL. Normal
appearance/no adnexal mass.

Left ovary

Measurements: 3.6 x 2.4 x 1.8 cm = volume: 8.1 mL. Normal
appearance/no adnexal mass.

Other findings

No abnormal free fluid.
IMPRESSION: Negative pelvic ultrasound

## 2021-12-14 DIAGNOSIS — Z419 Encounter for procedure for purposes other than remedying health state, unspecified: Secondary | ICD-10-CM | POA: Diagnosis not present

## 2022-01-14 DIAGNOSIS — Z419 Encounter for procedure for purposes other than remedying health state, unspecified: Secondary | ICD-10-CM | POA: Diagnosis not present

## 2022-02-13 DIAGNOSIS — Z419 Encounter for procedure for purposes other than remedying health state, unspecified: Secondary | ICD-10-CM | POA: Diagnosis not present

## 2022-03-16 DIAGNOSIS — Z419 Encounter for procedure for purposes other than remedying health state, unspecified: Secondary | ICD-10-CM | POA: Diagnosis not present

## 2022-04-15 DIAGNOSIS — Z419 Encounter for procedure for purposes other than remedying health state, unspecified: Secondary | ICD-10-CM | POA: Diagnosis not present

## 2022-05-16 DIAGNOSIS — Z419 Encounter for procedure for purposes other than remedying health state, unspecified: Secondary | ICD-10-CM | POA: Diagnosis not present

## 2022-06-16 DIAGNOSIS — Z419 Encounter for procedure for purposes other than remedying health state, unspecified: Secondary | ICD-10-CM | POA: Diagnosis not present

## 2022-07-16 DIAGNOSIS — Z419 Encounter for procedure for purposes other than remedying health state, unspecified: Secondary | ICD-10-CM | POA: Diagnosis not present

## 2022-08-16 DIAGNOSIS — Z419 Encounter for procedure for purposes other than remedying health state, unspecified: Secondary | ICD-10-CM | POA: Diagnosis not present

## 2022-09-15 DIAGNOSIS — Z419 Encounter for procedure for purposes other than remedying health state, unspecified: Secondary | ICD-10-CM | POA: Diagnosis not present

## 2022-10-16 DIAGNOSIS — Z419 Encounter for procedure for purposes other than remedying health state, unspecified: Secondary | ICD-10-CM | POA: Diagnosis not present

## 2022-11-16 DIAGNOSIS — Z419 Encounter for procedure for purposes other than remedying health state, unspecified: Secondary | ICD-10-CM | POA: Diagnosis not present

## 2022-11-27 ENCOUNTER — Telehealth: Payer: Self-pay

## 2022-11-27 NOTE — Telephone Encounter (Signed)
Mychart msg sent. AS, CMA

## 2022-12-15 DIAGNOSIS — Z419 Encounter for procedure for purposes other than remedying health state, unspecified: Secondary | ICD-10-CM | POA: Diagnosis not present

## 2023-01-15 DIAGNOSIS — Z419 Encounter for procedure for purposes other than remedying health state, unspecified: Secondary | ICD-10-CM | POA: Diagnosis not present

## 2023-02-14 DIAGNOSIS — Z419 Encounter for procedure for purposes other than remedying health state, unspecified: Secondary | ICD-10-CM | POA: Diagnosis not present

## 2023-03-17 DIAGNOSIS — Z419 Encounter for procedure for purposes other than remedying health state, unspecified: Secondary | ICD-10-CM | POA: Diagnosis not present

## 2023-04-16 DIAGNOSIS — Z419 Encounter for procedure for purposes other than remedying health state, unspecified: Secondary | ICD-10-CM | POA: Diagnosis not present

## 2023-05-17 DIAGNOSIS — Z419 Encounter for procedure for purposes other than remedying health state, unspecified: Secondary | ICD-10-CM | POA: Diagnosis not present

## 2023-06-17 DIAGNOSIS — Z419 Encounter for procedure for purposes other than remedying health state, unspecified: Secondary | ICD-10-CM | POA: Diagnosis not present

## 2023-07-17 DIAGNOSIS — Z419 Encounter for procedure for purposes other than remedying health state, unspecified: Secondary | ICD-10-CM | POA: Diagnosis not present

## 2023-08-17 DIAGNOSIS — Z419 Encounter for procedure for purposes other than remedying health state, unspecified: Secondary | ICD-10-CM | POA: Diagnosis not present

## 2023-10-06 ENCOUNTER — Other Ambulatory Visit: Payer: Self-pay

## 2023-10-06 ENCOUNTER — Ambulatory Visit (HOSPITAL_COMMUNITY)
Admission: EM | Admit: 2023-10-06 | Discharge: 2023-10-06 | Disposition: A | Discharge status: Home / Self Care | Payer: Medicaid Other | Attending: Family Medicine | Admitting: Family Medicine

## 2023-10-06 DIAGNOSIS — M542 Cervicalgia: Secondary | ICD-10-CM | POA: Diagnosis not present

## 2023-10-06 MED ORDER — IBUPROFEN 400 MG PO TABS: 400.0000 mg | ORAL_TABLET | Freq: Three times a day (TID) | ORAL | 0 refills | Status: DC | PRN

## 2023-10-06 NOTE — ED Triage Notes (Signed)
Arrives with complaints of persistent neck pain with movement x2 months. Patient was prescribed muscle relaxants with minimal relief.
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
## 2023-10-06 NOTE — Discharge Instructions (Signed)
It was nice seeing you today. I am sorry about your chronic neck pain. This could be arthritis of your neck. I sent in Ibuprofen as needed for pain to your pharmacy. Please follow up for physical therapy as well as PCP to consider CT of your neck if appropriate. I hope you start feeling better soon.

## 2023-10-18 ENCOUNTER — Emergency Department (HOSPITAL_COMMUNITY)
Admission: EM | Admit: 2023-10-18 | Discharge: 2023-10-18 | Disposition: A | Discharge status: Home / Self Care | Payer: Medicaid Other | Attending: Emergency Medicine | Admitting: Emergency Medicine

## 2023-10-18 ENCOUNTER — Other Ambulatory Visit: Payer: Self-pay

## 2023-10-18 ENCOUNTER — Encounter (HOSPITAL_COMMUNITY): Payer: Self-pay

## 2023-10-18 DIAGNOSIS — M791 Myalgia, unspecified site: Secondary | ICD-10-CM | POA: Diagnosis present

## 2023-10-18 DIAGNOSIS — M542 Cervicalgia: Secondary | ICD-10-CM | POA: Diagnosis not present

## 2023-10-18 DIAGNOSIS — R0981 Nasal congestion: Secondary | ICD-10-CM | POA: Insufficient documentation

## 2023-10-18 DIAGNOSIS — Z20822 Contact with and (suspected) exposure to covid-19: Secondary | ICD-10-CM | POA: Diagnosis not present

## 2023-10-18 LAB — RESP PANEL BY RT-PCR (RSV, FLU A&B, COVID)  RVPGX2
Influenza A by PCR: NEGATIVE
Influenza B by PCR: NEGATIVE
Resp Syncytial Virus by PCR: NEGATIVE
SARS Coronavirus 2 by RT PCR: NEGATIVE

## 2023-10-18 MED ORDER — IBUPROFEN 200 MG PO TABS
600.0000 mg | ORAL_TABLET | Freq: Once | ORAL | Status: AC
  Administered 2023-10-18: 600 mg via ORAL
  Filled 2023-10-18: qty 3

## 2023-10-18 NOTE — ED Triage Notes (Signed)
 Pt arrives via EMS. Pt reports generalized body aches and runny nose since yesterday. Reports ongoing neck pain since November. Pt AxOx4.

## 2023-10-18 NOTE — Discharge Instructions (Addendum)
 Evaluation today was overall reassuring.  Please follow-up with your PCP.  Suspect you may have a viral illness.  Treatment at home supportive with rest and hydration.  You can also take Tylenol ibuprofen as needed for symptomatic relief.

## 2023-10-18 NOTE — ED Provider Triage Note (Signed)
 Emergency Medicine Provider Triage Evaluation Note  Robin Nguyen , a 33 y.o. female  was evaluated in triage.  Pt complains of body aches, rhinorrhea, neck pain.  Review of Systems  Positive:  Negative:   Physical Exam  BP 120/73 (BP Location: Left Arm)   Pulse 88   Temp 98.2 F (36.8 C) (Oral)   Resp 16   SpO2 98%  Gen:   Awake, no distress   Resp:  Normal effort  MSK:   Moves extremities without difficulty  Other:    Medical Decision Making  Medically screening exam initiated at 12:48 PM.  Appropriate orders placed.  Robin Nguyen was informed that the remainder of the evaluation will be completed by another provider, this initial triage assessment does not replace that evaluation, and the importance of remaining in the ED until their evaluation is complete.  Concerned for neck pain since October. Patient demonstrating good neck ROM but stating that it hurts. Patient also with rhinorrhea and body aches since yesterday. Also endorses an episode of diarrhea yesterday which has since resolved.   Denies fever, nausea, vomiting, sore throat, cough   Robin Nguyen, [place] 10/18/23 1252

## 2023-10-18 NOTE — ED Provider Notes (Signed)
 [place] EMERGENCY DEPARTMENT AT [facility] Provider Note   CSN: [id number] Arrival date & time: 10/18/23  1237     History  Chief Complaint  Patient presents with   Generalized Body Aches   Nasal Congestion   HPI Robin Nguyen is a 33 y.o. female presenting for nasal congestion and generalized bodyaches.  Symptoms have been going on for about a month now.  Denies cough and fever.  States her body hurts all over.  She does deny chest pain shortness of breath.  Has not taken any medications at home for symptoms.  HPI     Home Medications Prior to Admission medications   Medication Sig Start Date End Date Taking? Authorizing Provider  cyclobenzaprine  (FLEXERIL ) 5 MG tablet Take 1 tablet (5 mg total) by mouth 3 (three) times daily as needed for muscle spasms. DO NOT DRINK ALCOHOL OR DRIVE WHILE TAKING THIS MEDICATION Patient not taking: No sig reported 11/03/20   Stuart Vernell Norris, PA-C  doxycycline  (VIBRAMYCIN ) 100 MG capsule Take 1 capsule (100 mg total) by mouth 2 (two) times daily. 02/09/21   Beverley Leita LABOR, PA-C  hydrocortisone  ointment 0.5 % Apply 1 application topically 2 (two) times daily. 02/08/21   Teresa Shelba SAUNDERS, NP  hydrOXYzine  (ATARAX /VISTARIL ) 25 MG tablet Take 1 tablet (25 mg total) by mouth 3 (three) times daily as needed for anxiety. Patient not taking: No sig reported 08/25/20   Money, Caron NOVAK, FNP  ibuprofen  (ADVIL ) 400 MG tablet Take 1 tablet (400 mg total) by mouth every 8 (eight) hours as needed for moderate pain (pain score 4-6). 10/06/23   Anders Otto DASEN, MD  megestrol  (MEGACE ) 40 MG tablet Take 1 tablet (40 mg total) by mouth 2 (two) times daily. 12/01/20   Dawson, Rolitta, CNM  metroNIDAZOLE  (FLAGYL ) 500 MG tablet Take 1 tablet (500 mg total) by mouth 2 (two) times daily. 02/09/21   Beverley Leita LABOR, PA-C  Norgestimate-Eth Estradiol (SPRINTEC 28 PO) Take by mouth. Patient not taking: No sig reported    [provider]   sertraline  (ZOLOFT ) 50 MG tablet Take 1 tablet (50 mg total) by mouth daily. Patient not taking: No sig reported 08/26/20   Money, Caron NOVAK, FNP  traZODone  (DESYREL ) 50 MG tablet Take 1 tablet (50 mg total) by mouth at bedtime as needed for sleep. 08/25/20   Money, Caron NOVAK, FNP      Allergies    Patient has no known allergies.    Review of Systems   See HPI for pertinent positives  Physical Exam Updated Vital Signs BP 120/73 (BP Location: Left Arm)   Pulse 88   Temp 98.2 F (36.8 C) (Oral)   Resp 16   SpO2 98%  Physical Exam Vitals and nursing note reviewed.  HENT:     Head: Normocephalic and atraumatic.     Mouth/Throat:     Mouth: Mucous membranes are moist.  Eyes:     General:        Right eye: No discharge.        Left eye: No discharge.     Conjunctiva/sclera: Conjunctivae normal.  Cardiovascular:     Rate and Rhythm: Normal rate and regular rhythm.     Pulses: Normal pulses.     Heart sounds: Normal heart sounds.  Pulmonary:     Effort: Pulmonary effort is normal.     Breath sounds: Normal breath sounds.  Abdominal:     General: Abdomen is flat.  Palpations: Abdomen is soft.  Skin:    General: Skin is warm and dry.  Neurological:     General: No focal deficit present.  Psychiatric:        Mood and Affect: Mood normal.     ED Results / Procedures / Treatments   Labs (all labs ordered are listed, but only abnormal results are displayed) Labs Reviewed  RESP PANEL BY RT-PCR (RSV, FLU A&B, COVID)  [id number]    EKG None  Radiology No results found.  Procedures Procedures    Medications Ordered in ED Medications  ibuprofen  (ADVIL ) tablet 600 mg (has no administration in time range)    ED Course/ Medical Decision Making/ A&P                                 Medical Decision Making  33 year old well-appearing female presenting for nasal congestion and bodyaches.  Exam was unremarkable.  Overall patient appears well, nontoxic, hemodynamically  stable in no acute distress suspect viral process.  Advised supportive treatment at home.  Discussed return precautions. Resp PCR is negative.  Treated with ibuprofen .  Advised to follow-up with her PCP.        Final Clinical Impression(s) / ED Diagnoses Final diagnoses:  Nasal congestion    Rx / DC Orders ED Discharge Orders     None         Lang Norleen POUR, PA-C 10/18/23 1517    Bernard Drivers, MD 10/22/23 1414

## 2023-11-07 ENCOUNTER — Encounter: Payer: Self-pay | Admitting: Family Medicine

## 2023-11-07 ENCOUNTER — Ambulatory Visit: Payer: Medicaid Other | Admitting: Family Medicine

## 2023-11-07 VITALS — BP 110/80 | HR 90 | Temp 98.2°F | Ht 63.0 in | Wt 126.0 lb

## 2023-11-07 DIAGNOSIS — M542 Cervicalgia: Secondary | ICD-10-CM | POA: Diagnosis not present

## 2023-11-07 DIAGNOSIS — F332 Major depressive disorder, recurrent severe without psychotic features: Secondary | ICD-10-CM

## 2023-11-07 DIAGNOSIS — B181 Chronic viral hepatitis B without delta-agent: Secondary | ICD-10-CM | POA: Insufficient documentation

## 2023-11-07 DIAGNOSIS — F331 Major depressive disorder, recurrent, moderate: Secondary | ICD-10-CM | POA: Insufficient documentation

## 2023-11-07 DIAGNOSIS — Z7689 Persons encountering health services in other specified circumstances: Secondary | ICD-10-CM | POA: Diagnosis not present

## 2023-11-07 DIAGNOSIS — Z30011 Encounter for initial prescription of contraceptive pills: Secondary | ICD-10-CM

## 2023-11-07 DIAGNOSIS — Z3009 Encounter for other general counseling and advice on contraception: Secondary | ICD-10-CM

## 2023-11-07 LAB — POCT URINE PREGNANCY: Preg Test, Ur: NEGATIVE

## 2023-11-07 MED ORDER — SERTRALINE HCL 50 MG PO TABS: 50.0000 mg | ORAL_TABLET | Freq: Every day | ORAL | 3 refills | Status: DC

## 2023-11-07 MED ORDER — AUROVELA 24 FE 1-20 MG-MCG(24) PO TABS: 1.0000 | ORAL_TABLET | Freq: Every day | ORAL | 11 refills | Status: DC
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
## 2023-11-12 ENCOUNTER — Ambulatory Visit (HOSPITAL_COMMUNITY)
Admission: EM | Admit: 2023-11-12 | Discharge: 2023-11-12 | Disposition: A | Discharge status: Home / Self Care | Payer: Medicaid Other | Attending: Nurse Practitioner | Admitting: Nurse Practitioner

## 2023-11-12 ENCOUNTER — Encounter (HOSPITAL_COMMUNITY): Payer: Self-pay | Admitting: *Deleted

## 2023-11-12 ENCOUNTER — Other Ambulatory Visit: Payer: Self-pay

## 2023-11-12 DIAGNOSIS — R109 Unspecified abdominal pain: Secondary | ICD-10-CM | POA: Diagnosis not present

## 2023-11-12 DIAGNOSIS — R1110 Vomiting, unspecified: Secondary | ICD-10-CM

## 2023-11-12 DIAGNOSIS — Z7689 Persons encountering health services in other specified circumstances: Secondary | ICD-10-CM | POA: Insufficient documentation

## 2023-11-12 DIAGNOSIS — M542 Cervicalgia: Secondary | ICD-10-CM | POA: Insufficient documentation

## 2023-11-12 DIAGNOSIS — Z30011 Encounter for initial prescription of contraceptive pills: Secondary | ICD-10-CM | POA: Insufficient documentation

## 2023-11-12 LAB — POCT URINALYSIS DIP (MANUAL ENTRY)
Bilirubin, UA: NEGATIVE
Glucose, UA: NEGATIVE mg/dL
Ketones, POC UA: NEGATIVE mg/dL
Leukocytes, UA: NEGATIVE
Nitrite, UA: NEGATIVE
Protein Ur, POC: NEGATIVE mg/dL
Spec Grav, UA: 1.01 (ref 1.010–1.025)
Urobilinogen, UA: 0.2 U/dL
pH, UA: 6 (ref 5.0–8.0)

## 2023-11-12 MED ORDER — ACETAMINOPHEN 325 MG PO TABS
ORAL_TABLET | ORAL | Status: AC
  Filled 2023-11-12: qty 3

## 2023-11-12 MED ORDER — ACETAMINOPHEN 325 MG PO TABS
975.0000 mg | ORAL_TABLET | Freq: Once | ORAL | Status: AC
  Administered 2023-11-12: 975 mg via ORAL

## 2023-11-12 MED ORDER — ONDANSETRON HCL 4 MG PO TABS: 4.0000 mg | ORAL_TABLET | Freq: Four times a day (QID) | ORAL | 0 refills | Status: DC

## 2023-11-12 NOTE — Assessment & Plan Note (Signed)
Referred to ortho;use NSAIDS as needed.

## 2023-11-12 NOTE — ED Provider Notes (Signed)
[facility]    CSN: [id number] Arrival date & time: 11/12/23  1321      History   Chief Complaint Chief Complaint  Patient presents with   Abdominal Pain   Emesis    HPI Robin Nguyen is a 33 y.o. female.   HPI  She is in today for evaluation of abdominal pain and emesis.  She reports that she had 1 episode of emesis today.  She endorses that she generally has severe abdominal pain with her menstrual cycle.  Her menstrual cycle last approximately 4 days.  She reports that she takes a dose of ibuprofen which is somewhat effective.  She denies dysuria or hematuria. Past Medical History:  Diagnosis Date   Depression    Gestational thrombocytopenia (HCC)    Headache    Hepatitis B    HSV (herpes simplex virus) anogenital infection    Migraine    Placenta previa 01/10/2015    Patient Active Problem List   Diagnosis Date Noted   Neck pain on left side 11/12/2023   Establishing care with new doctor, encounter for 11/12/2023   Encounter for initial prescription of contraceptive pills 11/12/2023   Hepatitis B carrier (HCC) 11/07/2023   Moderate episode of recurrent major depressive disorder (HCC) 11/07/2023   Chlamydia infection 12/13/2020   Need for COVID-19 vaccine 12/13/2020   Severe episode of recurrent major depressive disorder, without psychotic features (HCC)    Normal intrauterine pregnancy in third trimester 01/17/2017   Chronic migraine 08/09/2016   Depression, major, single episode, moderate (HCC) 05/29/2015   Status post primary low transverse cesarean section 02/04/2015   Low lying placenta without hemorrhage, antepartum 02/02/2015   IUGR (intrauterine growth restriction) 02/02/2015   Gestational thrombocytopenia without hemorrhage (HCC) 02/02/2015   Hepatitis B infection 05/01/2014    Past Surgical History:  Procedure Laterality Date   CESAREAN SECTION N/A 02/03/2015   Procedure: CESAREAN SECTION;  Surgeon: Myna Hidalgo, DO;  Location: [facility];  Service: Obstetrics;  Laterality: N/A;   CESAREAN SECTION WITH BILATERAL TUBAL LIGATION N/A 01/17/2017   Procedure: CESAREAN SECTION WITH BILATERAL TUBAL LIGATION;  Surgeon: Myna Hidalgo, DO;  Location: [facility];  Service: Obstetrics;  Laterality: N/A;  EDD 01/24/17    OB History     Gravida  2   Para  2   Term  2   Preterm      AB      Living  2      SAB      IAB      Ectopic      Multiple  0   Live Births  2            Home Medications    Prior to Admission medications   Medication Sig Start Date End Date Taking? Authorizing Provider  ondansetron (ZOFRAN) 4 MG tablet Take 1 tablet (4 mg total) by mouth every 6 (six) hours. 11/12/23  Yes Barbette Merino, NP  sertraline (ZOLOFT) 50 MG tablet Take 1 tablet (50 mg total) by mouth daily. 11/07/23  Yes Ellender Hose, NP  Norethindrone Acetate-Ethinyl Estrad-FE (AUROVELA 24 FE) 1-20 MG-MCG(24) tablet Take 1 tablet by mouth daily. 11/07/23   Ellender Hose, NP    Family History Family History  Problem Relation Age of Onset   Hypertension Mother    Healthy Father     Social History Social History   Tobacco Use   Smoking status: Never   Smokeless tobacco: Never  Vaping Use  Vaping status: Never Used  Substance Use Topics   Alcohol use: No   Drug use: No     Allergies   Patient has no known allergies.   Review of Systems Review of Systems   Physical Exam Triage Vital Signs ED Triage Vitals  Encounter Vitals Group     BP 11/12/23 1352 107/72     Systolic BP Percentile --      Diastolic BP Percentile --      Pulse Rate 11/12/23 1352 100     Resp 11/12/23 1352 18     Temp 11/12/23 1352 98.4 F (36.9 C)     Temp src --      SpO2 11/12/23 1352 100 %     Weight --      Height --      Head Circumference --      Peak Flow --      Pain Score 11/12/23 1354 9     Pain Loc --      Pain Education --      Exclude from Growth Chart --    No data found.  Updated Vital Signs BP 107/72    Pulse 100   Temp 98.4 F (36.9 C)   Resp 18   LMP 11/12/2023 (Exact Date)   SpO2 100%   Visual Acuity Right Eye Distance:   Left Eye Distance:   Bilateral Distance:    Right Eye Near:   Left Eye Near:    Bilateral Near:     Physical Exam Constitutional:      General: She is not in acute distress.    Appearance: She is normal weight. She is not ill-appearing, toxic-appearing or diaphoretic.  HENT:     Head: Normocephalic and atraumatic.  Cardiovascular:     Rate and Rhythm: Normal rate.     Heart sounds: Normal heart sounds.  Pulmonary:     Effort: Pulmonary effort is normal.  Abdominal:     General: Abdomen is flat. Bowel sounds are decreased. There is no distension or abdominal bruit. There are no signs of injury.     Palpations: Abdomen is soft.     Tenderness: There is abdominal tenderness in the right lower quadrant and suprapubic area.  Skin:    General: Skin is warm.     Capillary Refill: Capillary refill takes less than 2 seconds.  Neurological:     Mental Status: She is alert.      UC Treatments / Results  Labs (all labs ordered are listed, but only abnormal results are displayed) Labs Reviewed  POCT URINALYSIS DIP (MANUAL ENTRY) - Abnormal; Notable for the following components:      Result Value   Blood, UA moderate (*)    All other components within normal limits    EKG   Radiology No results found.  Procedures Procedures (including critical care time)  Medications Ordered in UC Medications  acetaminophen (TYLENOL) tablet 975 mg (has no administration in time range)    Initial Impression / Assessment and Plan / UC Course  I have reviewed the triage vital signs and the nursing notes.  Pertinent labs & imaging results that were available during my care of the patient were reviewed by me and considered in my medical decision making (see chart for details).     Abdominal pain Final Clinical Impressions(s) / UC Diagnoses   Final  diagnoses:  Abdominal pain, unspecified abdominal location  Vomiting, unspecified vomiting type, unspecified whether nausea present  Discharge Instructions      Your urinalysis is negative.  You have been prescribed ondansetron 4 mg 1 tablet every 6 hours as needed for nausea and vomiting.  Due to your new prescription of sertraline it is recommended that she not use ibuprofen.  The recommendation is for you to use Tylenol.  You have been given 1 dose of Tylenol in office.  You can take a dose of Tylenol every 6-8 hours as needed for pain.    ED Prescriptions     Medication Sig Dispense Auth. Provider   ondansetron (ZOFRAN) 4 MG tablet Take 1 tablet (4 mg total) by mouth every 6 (six) hours. 12 tablet Barbette Merino, NP      PDMP not reviewed this encounter.   Thad Ranger [place], [place] 11/12/23 [phone]

## 2023-11-12 NOTE — ED Triage Notes (Signed)
Pt reports ABD pain and vomiting. Pt reports she started her period today and has had sever ABD cramps and vomiting.

## 2023-11-12 NOTE — Discharge Instructions (Addendum)
Your urinalysis is negative.  You have been prescribed ondansetron 4 mg 1 tablet every 6 hours as needed for nausea and vomiting.  Due to your new prescription of sertraline it is recommended that she not use ibuprofen.  The recommendation is for you to use Tylenol.  You have been given 1 dose of Tylenol in office.  You can take a dose of Tylenol every 6-8 hours as needed for pain.

## 2023-11-12 NOTE — Assessment & Plan Note (Signed)
Start zoloft 50 mg by mouth once daily.

## 2023-12-05 ENCOUNTER — Encounter: Payer: Medicaid Other | Admitting: Family Medicine

## 2023-12-07 ENCOUNTER — Ambulatory Visit (INDEPENDENT_AMBULATORY_CARE_PROVIDER_SITE_OTHER): Payer: Medicaid Other | Admitting: Family Medicine

## 2023-12-07 ENCOUNTER — Encounter: Payer: Self-pay | Admitting: Family Medicine

## 2023-12-07 ENCOUNTER — Other Ambulatory Visit (HOSPITAL_COMMUNITY)
Admission: RE | Admit: 2023-12-07 | Discharge: 2023-12-07 | Disposition: A | Discharge status: Home / Self Care | Payer: Medicaid Other | Source: Ambulatory Visit | Attending: Family Medicine | Admitting: Family Medicine

## 2023-12-07 ENCOUNTER — Other Ambulatory Visit: Payer: Self-pay | Admitting: Family Medicine

## 2023-12-07 VITALS — BP 110/80 | Temp 98.1°F | Ht 63.0 in | Wt 131.0 lb

## 2023-12-07 DIAGNOSIS — Z114 Encounter for screening for human immunodeficiency virus [HIV]: Secondary | ICD-10-CM

## 2023-12-07 DIAGNOSIS — Z124 Encounter for screening for malignant neoplasm of cervix: Secondary | ICD-10-CM | POA: Diagnosis not present

## 2023-12-07 DIAGNOSIS — Z0000 Encounter for general adult medical examination without abnormal findings: Secondary | ICD-10-CM | POA: Diagnosis not present

## 2023-12-07 DIAGNOSIS — F332 Major depressive disorder, recurrent severe without psychotic features: Secondary | ICD-10-CM

## 2023-12-07 DIAGNOSIS — Z83438 Family history of other disorder of lipoprotein metabolism and other lipidemia: Secondary | ICD-10-CM | POA: Diagnosis not present

## 2023-12-07 DIAGNOSIS — F331 Major depressive disorder, recurrent, moderate: Secondary | ICD-10-CM | POA: Diagnosis not present

## 2023-12-07 DIAGNOSIS — M542 Cervicalgia: Secondary | ICD-10-CM

## 2023-12-07 MED ORDER — SERTRALINE HCL 50 MG PO TABS
50.0000 mg | ORAL_TABLET | Freq: Every day | ORAL | 3 refills | Status: DC
Start: 2023-12-07 — End: 2024-06-05
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
## 2023-12-07 NOTE — Patient Instructions (Signed)

## 2023-12-10 DIAGNOSIS — Z124 Encounter for screening for malignant neoplasm of cervix: Secondary | ICD-10-CM | POA: Insufficient documentation

## 2023-12-10 DIAGNOSIS — Z0000 Encounter for general adult medical examination without abnormal findings: Secondary | ICD-10-CM | POA: Insufficient documentation

## 2023-12-10 DIAGNOSIS — Z114 Encounter for screening for human immunodeficiency virus [HIV]: Secondary | ICD-10-CM | POA: Insufficient documentation

## 2023-12-10 DIAGNOSIS — Z83438 Family history of other disorder of lipoprotein metabolism and other lipidemia: Secondary | ICD-10-CM | POA: Insufficient documentation

## 2023-12-10 LAB — CMP14+EGFR
ALT: 21 [IU]/L (ref 0–32)
AST: 25 [IU]/L (ref 0–40)
Albumin: 4.5 g/dL (ref 3.9–4.9)
Alkaline Phosphatase: 40 [IU]/L — ABNORMAL LOW (ref 44–121)
BUN/Creatinine Ratio: 10 (ref 9–23)
BUN: 9 mg/dL (ref 6–20)
Bilirubin Total: 0.3 mg/dL (ref 0.0–1.2)
CO2: 23 mmol/L (ref 20–29)
Calcium: 9.4 mg/dL (ref 8.7–10.2)
Chloride: 104 mmol/L (ref 96–106)
Creatinine, Ser: 0.88 mg/dL (ref 0.57–1.00)
Globulin, Total: 3.6 g/dL (ref 1.5–4.5)
Glucose: 58 mg/dL — ABNORMAL LOW (ref 70–99)
Potassium: 3.9 mmol/L (ref 3.5–5.2)
Sodium: 139 mmol/L (ref 134–144)
Total Protein: 8.1 g/dL (ref 6.0–8.5)
eGFR: 89 mL/min/{1.73_m2} (ref >59)

## 2023-12-10 LAB — CBC WITH DIFFERENTIAL/PLATELET
Basophils Absolute: 0 10*3/uL (ref 0.0–0.2)
Basos: 1 %
EOS (ABSOLUTE): 0.1 10*3/uL (ref 0.0–0.4)
Eos: 3 %
Hematocrit: 39.4 % (ref 34.0–46.6)
Hemoglobin: 12.7 g/dL (ref 11.1–15.9)
Immature Grans (Abs): 0 10*3/uL (ref 0.0–0.1)
Immature Granulocytes: 0 %
Lymphocytes Absolute: 2.7 10*3/uL (ref 0.7–3.1)
Lymphs: 62 %
MCH: 29.4 pg (ref 26.6–33.0)
MCHC: 32.2 g/dL (ref 31.5–35.7)
MCV: 91 fL (ref 79–97)
Monocytes Absolute: 0.4 10*3/uL (ref 0.1–0.9)
Monocytes: 10 %
Neutrophils Absolute: 1 10*3/uL — ABNORMAL LOW (ref 1.4–7.0)
Neutrophils: 24 %
Platelets: 165 10*3/uL (ref 150–450)
RBC: 4.32 x10E6/uL (ref 3.77–5.28)
RDW: 13.1 % (ref 11.7–15.4)
WBC: 4.3 10*3/uL (ref 3.4–10.8)

## 2023-12-10 LAB — LIPID PANEL
Chol/HDL Ratio: 2.4 {ratio} (ref 0.0–4.4)
Cholesterol, Total: 147 mg/dL (ref 100–199)
HDL: 62 mg/dL (ref >39)
LDL Chol Calc (NIH): 71 mg/dL (ref 0–99)
Triglycerides: 70 mg/dL (ref 0–149)
VLDL Cholesterol Cal: 14 mg/dL (ref 5–40)

## 2023-12-10 LAB — HIV ANTIBODY (ROUTINE TESTING W REFLEX): HIV Screen 4th Generation wRfx: NONREACTIVE

## 2023-12-10 NOTE — Assessment & Plan Note (Signed)
 Recurrent. States zoloft 50 mg is helping. Continue current dose.

## 2023-12-10 NOTE — Addendum Note (Signed)
 Addended byMoshe Salisbury, Gilmer Kaminsky E on: 12/10/2023 12:44 PM   Modules accepted: Orders

## 2023-12-10 NOTE — Assessment & Plan Note (Signed)
 Low fat diet advised.

## 2023-12-13 LAB — CYTOLOGY - PAP
Chlamydia: POSITIVE — AB
Comment: NEGATIVE
Comment: NEGATIVE
Comment: NEGATIVE
Comment: NEGATIVE
Comment: NORMAL
Diagnosis: UNDETERMINED — AB
HSV1: NEGATIVE
HSV2: NEGATIVE
High risk HPV: NEGATIVE
Neisseria Gonorrhea: NEGATIVE
Trichomonas: NEGATIVE

## 2023-12-14 ENCOUNTER — Encounter: Payer: Self-pay | Admitting: Family Medicine

## 2023-12-14 ENCOUNTER — Other Ambulatory Visit: Payer: Self-pay | Admitting: Family Medicine

## 2023-12-14 DIAGNOSIS — A749 Chlamydial infection, unspecified: Secondary | ICD-10-CM

## 2023-12-14 DIAGNOSIS — R87610 Atypical squamous cells of undetermined significance on cytologic smear of cervix (ASC-US): Secondary | ICD-10-CM

## 2023-12-14 MED ORDER — DOXYCYCLINE HYCLATE 100 MG PO TABS: 100.0000 mg | ORAL_TABLET | Freq: Two times a day (BID) | ORAL | 0 refills | Status: AC

## 2023-12-14 NOTE — Progress Notes (Signed)
 Pap smear showed abnormal cells in the cervix, I will refer you to OB-GYN for further evaluation. Also you tested positive for chlamydia. Sent doxycycline 100 mg twice daily X 7 days to the pharmacy. Other tests are normal.  Thanks

## 2023-12-20 ENCOUNTER — Other Ambulatory Visit (INDEPENDENT_AMBULATORY_CARE_PROVIDER_SITE_OTHER): Payer: Self-pay

## 2023-12-20 ENCOUNTER — Ambulatory Visit: Payer: Medicaid Other | Admitting: Physical Medicine and Rehabilitation

## 2023-12-20 ENCOUNTER — Encounter: Payer: Self-pay | Admitting: Physical Medicine and Rehabilitation

## 2023-12-20 VITALS — BP 117/79 | HR 89

## 2023-12-20 DIAGNOSIS — M542 Cervicalgia: Secondary | ICD-10-CM | POA: Diagnosis not present

## 2023-12-20 DIAGNOSIS — M7918 Myalgia, other site: Secondary | ICD-10-CM | POA: Diagnosis not present

## 2023-12-20 DIAGNOSIS — M25512 Pain in left shoulder: Secondary | ICD-10-CM | POA: Diagnosis not present

## 2023-12-20 DIAGNOSIS — G8929 Other chronic pain: Secondary | ICD-10-CM | POA: Diagnosis not present

## 2023-12-20 DIAGNOSIS — M25511 Pain in right shoulder: Secondary | ICD-10-CM

## 2023-12-20 MED ORDER — MELOXICAM 15 MG PO TABS: 15.0000 mg | ORAL_TABLET | Freq: Every day | ORAL | 0 refills | Status: DC

## 2023-12-20 MED ORDER — TIZANIDINE HCL 4 MG PO TABS: 4.0000 mg | ORAL_TABLET | Freq: Every day | ORAL | 0 refills | Status: DC

## 2023-12-20 NOTE — Progress Notes (Signed)
 [name] - 33 y.o. female MRN [id number]  Date of birth: 18-Aug-1991  Office Visit Note: Visit Date: 12/20/2023 PCP: Ellender Hose, NP Referred by: Ellender Hose, NP  Subjective: Chief Complaint  Patient presents with   Neck - Pain   HPI: Robin Nguyen is a 33 y.o. female who comes in today per the request of Ellender Hose, NP for evaluation of chronic, worsening and severe bilateral neck pain radiating to shoulders. Pain ongoing since October of 2024. Her pain is most severe in the morning after waking up. Her pain also increases with movement and activity. She describes her pain as sore and sharp, currently rates as 8 out of 10. Some relief of pain with home exercise regimen, heating pad, rest and use of medications. No relief of pain with Ibuprofen and Flexeril. PCP did refer her to physical therapy, however states she did not get phone call to schedule appointment. She was evaluated at Urgent Care in December of 2024 for continued neck pain, these notes can be further reviewed in EPIC. No prior imaging of cervical spine. Patient denies focal weakness, numbness and tingling. No recent trauma or falls.      Review of Systems  Musculoskeletal:  Positive for myalgias and neck pain.  Neurological:  Negative for tingling, sensory change, focal weakness and weakness.  All other systems reviewed and are negative.  Otherwise per HPI.  Assessment & Plan: Visit Diagnoses:    ICD-10-CM   1. Cervicalgia  M54.2 XR Cervical Spine 2 or 3 views    2. Myofascial pain syndrome  M79.18 XR Cervical Spine 2 or 3 views       Plan: Findings:  Chronic, worsening and severe bilateral neck pain radiating to shoulders. Patient continues to have severe pain despite good conservative therapies such as home exercise regimen, rest and use of medications.  Patient's clinical presentation and exam are consistent with myofascial pain syndrome.  Myofascial tenderness noted to bilateral sternocleidomastoid and  levator scapula muscles upon palpation today.  She does have some pain with side-to-side rotation. I obtained cervical radiographs in the office today that are essentially unremarkable. We discussed treatment plan in detail, I placed order for short course of formal physical therapy with a focus on manual treatments and dry needling.  We also discussed medication management and I prescribed tizanidine and meloxicam for her to try.  I encouraged patient to remain active as tolerated.  She can continue with conservative therapies, would recommend use of heating pad and lidocaine patches as well.  I would like to see her back in about 8 weeks for re-evaluation.  No red flag symptoms noted upon exam today.    Meds & Orders: No orders of the defined types were placed in this encounter.   Orders Placed This Encounter  Procedures   XR Cervical Spine 2 or 3 views    Follow-up: Return for 8 week follow up for re-evaluation.   Procedures: No procedures performed      Clinical History: No specialty comments available.   She reports that she has never smoked. She has never used smokeless tobacco. No results for input(s): "HGBA1C", "LABURIC" in the last 8760 hours.  Objective:  VS:  HT:    WT:   BMI:     BP:117/79  HR:89bpm  TEMP: ( )  RESP:  Physical Exam Vitals and nursing note reviewed.  HENT:     Head: Normocephalic and atraumatic.     Right Ear: External ear normal.  Left Ear: External ear normal.     Nose: Nose normal.     Mouth/Throat:     Mouth: Mucous membranes are moist.  Eyes:     Extraocular Movements: Extraocular movements intact.  Cardiovascular:     Rate and Rhythm: Normal rate.     Pulses: Normal pulses.  Pulmonary:     Effort: Pulmonary effort is normal.  Abdominal:     General: Abdomen is flat. There is no distension.  Musculoskeletal:        General: Tenderness present.     Cervical back: Tenderness present.     Comments: Discomfort noted and side-to-side  rotation. Patient has good strength in the upper extremities including 5 out of 5 strength in wrist extension, long finger flexion and APB. Shoulder range of motion is full bilaterally without any sign of impingement. There is no atrophy of the hands intrinsically. Sensation intact bilaterally. Myofascial tenderness noted to bilateral sternocleidomastoid and levator scapulae muscles. Negative Hoffman's sign. Negative Spurling's sign.     Skin:    General: Skin is warm and dry.     Capillary Refill: Capillary refill takes less than 2 seconds.  Neurological:     General: No focal deficit present.     Mental Status: She is alert and oriented to person, place, and time.  Psychiatric:        Mood and Affect: Mood normal.        Behavior: Behavior normal.     Ortho Exam  Imaging: No results found.  Past Medical/Family/Surgical/Social History: Medications & Allergies reviewed per EMR, new medications updated. Patient Active Problem List   Diagnosis Date Noted   Family history of mixed hyperlipidemia 12/10/2023   Encounter for screening for HIV 12/10/2023   Screening for cervical cancer 12/10/2023   Encounter for general adult medical examination w/o abnormal findings 12/10/2023   Neck pain on left side 11/12/2023   Establishing care with new doctor, encounter for 11/12/2023   Encounter for initial prescription of contraceptive pills 11/12/2023   Hepatitis B carrier (HCC) 11/07/2023   Moderate episode of recurrent major depressive disorder (HCC) 11/07/2023   Chlamydia infection 12/13/2020   Need for COVID-19 vaccine 12/13/2020   Severe episode of recurrent major depressive disorder, without psychotic features (HCC)    Normal intrauterine pregnancy in third trimester 01/17/2017   Chronic migraine 08/09/2016   Depression, major, single episode, moderate (HCC) 05/29/2015   Status post primary low transverse cesarean section 02/04/2015   Low lying placenta without hemorrhage, antepartum  02/02/2015   IUGR (intrauterine growth restriction) 02/02/2015   Gestational thrombocytopenia without hemorrhage (HCC) 02/02/2015   Hepatitis B infection 05/01/2014   Past Medical History:  Diagnosis Date   Depression    Gestational thrombocytopenia (HCC)    Headache    Hepatitis B    HSV (herpes simplex virus) anogenital infection    Migraine    Placenta previa 01/10/2015   Family History  Problem Relation Age of Onset   Hypertension Mother    Healthy Father    Past Surgical History:  Procedure Laterality Date   CESAREAN SECTION N/A 02/03/2015   Procedure: CESAREAN SECTION;  Surgeon: Myna Hidalgo, DO;  Location: [facility];  Service: Obstetrics;  Laterality: N/A;   CESAREAN SECTION WITH BILATERAL TUBAL LIGATION N/A 01/17/2017   Procedure: CESAREAN SECTION WITH BILATERAL TUBAL LIGATION;  Surgeon: Myna Hidalgo, DO;  Location: [facility];  Service: Obstetrics;  Laterality: N/A;  EDD 01/24/17   Social History   Occupational History  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  Tobacco Use   Smoking status: Never   Smokeless tobacco: Never  Vaping Use   Vaping status: Never Used  Substance and Sexual Activity   Alcohol use: No   Drug use: No   Sexual activity: Yes    Partners: Male    Birth control/protection: Surgical    Comment: last sex several months ago

## 2023-12-20 NOTE — Progress Notes (Signed)
 Pain Scale   Average Pain 8

## 2023-12-27 ENCOUNTER — Ambulatory Visit: Attending: Physical Medicine and Rehabilitation | Admitting: Physical Therapy

## 2023-12-27 ENCOUNTER — Other Ambulatory Visit: Payer: Self-pay

## 2023-12-27 DIAGNOSIS — M25512 Pain in left shoulder: Secondary | ICD-10-CM | POA: Diagnosis not present

## 2023-12-27 DIAGNOSIS — M7918 Myalgia, other site: Secondary | ICD-10-CM | POA: Insufficient documentation

## 2023-12-27 DIAGNOSIS — M25511 Pain in right shoulder: Secondary | ICD-10-CM | POA: Insufficient documentation

## 2023-12-27 DIAGNOSIS — M542 Cervicalgia: Secondary | ICD-10-CM | POA: Insufficient documentation

## 2023-12-27 DIAGNOSIS — M6281 Muscle weakness (generalized): Secondary | ICD-10-CM | POA: Diagnosis present

## 2023-12-27 DIAGNOSIS — G8929 Other chronic pain: Secondary | ICD-10-CM | POA: Diagnosis not present

## 2023-12-27 NOTE — Therapy (Signed)
 OUTPATIENT PHYSICAL THERAPY SHOULDER EVALUATION   Patient Name: Robin Nguyen MRN: [id number] DOB:1991/10/12, 33 y.o., female Today's Date: 12/27/2023   PT End of Session - 12/27/23 1324     Visit Number 1    Number of Visits --   1-2x/week   Date for PT Re-Evaluation 02/21/24    Authorization Type [employer] MCD    PT Start Time 1220    PT Stop Time 1252    PT Time Calculation (min) 32 min             Past Medical History:  Diagnosis Date   Depression    Gestational thrombocytopenia (HCC)    Headache    Hepatitis B    HSV (herpes simplex virus) anogenital infection    Migraine    Placenta previa 01/10/2015   Past Surgical History:  Procedure Laterality Date   CESAREAN SECTION N/A 02/03/2015   Procedure: CESAREAN SECTION;  Surgeon: Myna Hidalgo, DO;  Location: [facility];  Service: Obstetrics;  Laterality: N/A;   CESAREAN SECTION WITH BILATERAL TUBAL LIGATION N/A 01/17/2017   Procedure: CESAREAN SECTION WITH BILATERAL TUBAL LIGATION;  Surgeon: Myna Hidalgo, DO;  Location: [facility];  Service: Obstetrics;  Laterality: N/A;  EDD 01/24/17   Patient Active Problem List   Diagnosis Date Noted   Family history of mixed hyperlipidemia 12/10/2023   Encounter for screening for HIV 12/10/2023   Screening for cervical cancer 12/10/2023   Encounter for general adult medical examination w/o abnormal findings 12/10/2023   Neck pain on left side 11/12/2023   Establishing care with new doctor, encounter for 11/12/2023   Encounter for initial prescription of contraceptive pills 11/12/2023   Hepatitis B carrier (HCC) 11/07/2023   Moderate episode of recurrent major depressive disorder (HCC) 11/07/2023   Chlamydia infection 12/13/2020   Need for COVID-19 vaccine 12/13/2020   Severe episode of recurrent major depressive disorder, without psychotic features (HCC)    Normal intrauterine pregnancy in third trimester 01/17/2017   Chronic migraine 08/09/2016   Depression, major, single  episode, moderate (HCC) 05/29/2015   Status post primary low transverse cesarean section 02/04/2015   Low lying placenta without hemorrhage, antepartum 02/02/2015   IUGR (intrauterine growth restriction) 02/02/2015   Gestational thrombocytopenia without hemorrhage (HCC) 02/02/2015   Hepatitis B infection 05/01/2014    PCP: Ellender Hose, NP  REFERRING PROVIDER: Juanda Chance, NP  THERAPY DIAG:  Cervicalgia - Plan: PT plan of care cert/re-cert  Muscle weakness - Plan: PT plan of care cert/re-cert  REFERRING DIAG: Chronic pain of both shoulders [M25.511, G89.29, M25.512], Cervicalgia [M54.2], Myofascial pain syndrome [M79.18]   Rationale for Evaluation and Treatment:  Rehabilitation  SUBJECTIVE:  PERTINENT PAST HISTORY:  Depression, hepatitis B        PRECAUTIONS: Hep B  WEIGHT BEARING RESTRICTIONS No  FALLS:  Has patient fallen in last 6 months? No, Number of falls: 0  MOI/History of condition:  Onset date: ~6 months  SUBJECTIVE STATEMENT  Robin Nguyen is a 33 y.o. female who presents to clinic with chief complaint of neck pain which started October of 2024.  She cannot recall any specific injury or inciting event.  The pain only occurs when she turns her head to the side.  She denies any n/t or weakness in UE.  It can vary daily from mild/moderate discomfort to severe.   Red flags:  denies   Pain:  Are you having pain? Yes Pain location: cx spine NPRS scale:  3/10 to 10/10 Aggravating factors: rotation  Relieving factors: medication (a little) Pain description: sharp and aching Stage: Chronic 24 hour pattern: worse in morning   Occupation: works with disabled Psychologist, prison and probation services Device: na  Hand Dominance: R  Patient Goals/Specific Activities: reduce pain   OBJECTIVE:   DIAGNOSTIC FINDINGS:  No findings on X-ray  GENERAL OBSERVATION: Forward head     SENSATION: Light touch: Appears intact   PALPATION: TTP mid cervical paraspinals, R UT,  R LS  Cervical ROM  ROM ROM  (Eval)  Flexion n  Extension n  Right lateral flexion N* L UT  Left lateral flexion N* R UT  Right rotation N*  Left rotation N*  Flexion rotation (normal is 30 degrees)   Flexion rotation (normal is 30 degrees)     (Blank rows = not tested, N = WNL, * = concordant pain)  UPPER EXTREMITY MMT:  MMT Right (Eval) Left (Eval)  Shoulder flexion 4* 4*  Shoulder abduction (C5)    Shoulder ER    Shoulder IR    Middle trapezius 4* 4*  Lower trapezius    Shoulder extension    Grip strength    Shoulder shrug (C4)    Elbow flexion (C6)    Elbow ext (C7)    Thumb ext (C8)    Finger abd (T1)    Grossly     (Blank rows = not tested, score listed is out of 5 possible points.  N = WNL, D = diminished, C = clear for gross weakness with myotome testing, * = concordant pain with testing)   JOINT MOBILITY TESTING:  Joint mobility WNL  PATIENT SURVEYS:  NDI: 16/50 - 32% disability    TODAY'S TREATMENT:  Therapeutic Exercise: Creating, reviewing, and completing below HEP    PATIENT EDUCATION ([place]/HM):  POC, diagnosis, prognosis, HEP, and outcome measures.  Pt educated via explanation, demonstration, and handout (HEP).  Pt confirms understanding verbally.  Discussed ideal side sleeping position with link sent though HEP.   HOME EXERCISE PROGRAM:  Access Code: [email or web address] URL: [email or web address] Date: 12/27/2023 Prepared by: Alphonzo Severance  Program Notes [email or web address]  Exercises - Supine Cervical Retraction with Towel  - 2 x daily - 7 x weekly - 2 sets - 10 reps - 5 hold - Standing Shoulder Horizontal Abduction with Resistance  - 1 x daily - 7 x weekly - 3 sets - 10 reps  Treatment priorities   Eval        Manual         Cervical and periscapular strengthening                                  ASSESSMENT:  CLINICAL IMPRESSION: Robin Nguyen is a 33 y.o. female who presents to clinic with  signs and sxs consistent with neck pain.  Most consistent with myofacial pain generator given full cervical ROM, normal cervical segmental testing, and no signs of radiculopathy.  Discussed ideal sleeping posture.  Pt will benefit from skilled PT to address relevant deficits and improve comfort with daily activities.    OBJECTIVE IMPAIRMENTS: Pain, UE strength  ACTIVITY LIMITATIONS: head turns, sleeping, reading  PERSONAL FACTORS: Robin medical history and pertinent history   REHAB POTENTIAL: Good  CLINICAL DECISION MAKING: Stable/uncomplicated  EVALUATION COMPLEXITY: Low   GOALS:   SHORT TERM GOALS: Target date: 01/24/2024   Robin Nguyen will be >75% HEP compliant to improve carryover between sessions and facilitate independent management of condition  Evaluation:  ongoing Goal status: INITIAL   LONG TERM GOALS: Target date: 02/21/2024   Robin Nguyen will self report >/= 50% decrease in pain from evaluation to improve function in daily tasks  Evaluation/Baseline: 10/10 max pain Goal status: INITIAL   2.  Robin Nguyen will be able to demonstrate full cervical rotation, not limited by pain  Evaluation/Baseline: as much as 10/10 pain Goal status: INITIAL   3.  Robin Nguyen will improve the following MMTs to >/= 4+/5 to show improvement in strength:  mid trap, shoulder flexion   Evaluation/Baseline: Robin chart in note Goal status: INITIAL   4.  Robin Nguyen will report confidence in self management of condition at time of discharge with advanced HEP  Evaluation/Baseline: unable to self manage Goal status: INITIAL    PLAN: PT FREQUENCY: 1-2x/week  PT DURATION: 8 weeks  PLANNED INTERVENTIONS:  97164- PT Re-evaluation, 97110-Therapeutic exercises, 97530- Therapeutic activity, 97112- Neuromuscular re-education, 97535- Self Care, 91478- Manual therapy, [id number]- Gait training, [id number]- Aquatic Therapy, [id number]- Electrical stimulation (manual), [id number]- Vasopneumatic device, [id number]- Traction (mechanical),  [id number]- Ionotophoresis 4mg /ml Dexamethasone, Taping, Dry Needling, Joint manipulation, and Spinal manipulation.   Alphonzo Severance PT, DPT 12/27/2023, 1:34 PM

## 2024-01-03 ENCOUNTER — Ambulatory Visit

## 2024-01-03 DIAGNOSIS — M542 Cervicalgia: Secondary | ICD-10-CM | POA: Diagnosis not present

## 2024-01-03 DIAGNOSIS — M6281 Muscle weakness (generalized): Secondary | ICD-10-CM

## 2024-01-03 NOTE — Therapy (Signed)
 OUTPATIENT PHYSICAL THERAPY TREATMENT NOTE   Patient Name: Robin Nguyen MRN: [id number] DOB:1991-05-17, 33 y.o., female Today's Date: 01/03/2024   PT End of Session - 01/03/24 0920     Visit Number 2    Date for PT Re-Evaluation 02/21/24    Authorization Type [employer] MCD    PT Start Time 0918    PT Stop Time 0956    PT Time Calculation (min) 38 min    Activity Tolerance Patient tolerated treatment well    Behavior During Therapy [facility] for tasks assessed/performed              Past Medical History:  Diagnosis Date   Depression    Gestational thrombocytopenia (HCC)    Headache    Hepatitis B    HSV (herpes simplex virus) anogenital infection    Migraine    Placenta previa 01/10/2015   Past Surgical History:  Procedure Laterality Date   CESAREAN SECTION N/A 02/03/2015   Procedure: CESAREAN SECTION;  Surgeon: Myna Hidalgo, DO;  Location: [facility];  Service: Obstetrics;  Laterality: N/A;   CESAREAN SECTION WITH BILATERAL TUBAL LIGATION N/A 01/17/2017   Procedure: CESAREAN SECTION WITH BILATERAL TUBAL LIGATION;  Surgeon: Myna Hidalgo, DO;  Location: [facility];  Service: Obstetrics;  Laterality: N/A;  EDD 01/24/17   Patient Active Problem List   Diagnosis Date Noted   Family history of mixed hyperlipidemia 12/10/2023   Encounter for screening for HIV 12/10/2023   Screening for cervical cancer 12/10/2023   Encounter for general adult medical examination w/o abnormal findings 12/10/2023   Neck pain on left side 11/12/2023   Establishing care with new doctor, encounter for 11/12/2023   Encounter for initial prescription of contraceptive pills 11/12/2023   Hepatitis B carrier (HCC) 11/07/2023   Moderate episode of recurrent major depressive disorder (HCC) 11/07/2023   Chlamydia infection 12/13/2020   Need for COVID-19 vaccine 12/13/2020   Severe episode of recurrent major depressive disorder, without psychotic features (HCC)    Normal intrauterine pregnancy in third  trimester 01/17/2017   Chronic migraine 08/09/2016   Depression, major, single episode, moderate (HCC) 05/29/2015   Status post primary low transverse cesarean section 02/04/2015   Low lying placenta without hemorrhage, antepartum 02/02/2015   IUGR (intrauterine growth restriction) 02/02/2015   Gestational thrombocytopenia without hemorrhage (HCC) 02/02/2015   Hepatitis B infection 05/01/2014    PCP: Ellender Hose, NP  REFERRING PROVIDER: Juanda Chance, NP  THERAPY DIAG:  Cervicalgia  Muscle weakness  REFERRING DIAG: Chronic pain of both shoulders [M25.511, G89.29, M25.512], Cervicalgia [M54.2], Myofascial pain syndrome [M79.18]   Rationale for Evaluation and Treatment:  Rehabilitation  SUBJECTIVE:  PERTINENT PAST HISTORY:  Depression, hepatitis B        PRECAUTIONS: Hep B  WEIGHT BEARING RESTRICTIONS No  FALLS:  Has patient fallen in last 6 months? No, Number of falls: 0  MOI/History of condition:  Onset date: ~6 months  SUBJECTIVE STATEMENT  Patient reports to PT with minimal pain. She states that she is no longer sleeping with a pillow at night which has been helpful. She is doing her home exercise program.    EVAL: Robin Nguyen is a 33 y.o. female who presents to clinic with chief complaint of neck pain which started October of 2024.  She cannot recall any specific injury or inciting event.  The pain only occurs when she turns her head to the side.  She denies any n/t or weakness in UE.  It can vary daily from  mild/moderate discomfort to severe.   Red flags:  denies   Pain:  Are you having pain? Yes Pain location: cx spine NPRS scale:  3/10 to 10/10 Aggravating factors: rotation  Relieving factors: medication (a little) Pain description: sharp and aching Stage: Chronic 24 hour pattern: worse in morning   Occupation: works with disabled adults  Assistive Device: na  Hand Dominance: R  Patient Goals/Specific Activities: reduce  pain   OBJECTIVE:   DIAGNOSTIC FINDINGS:  No findings on X-ray  GENERAL OBSERVATION: Forward head     SENSATION: Light touch: Appears intact   PALPATION: TTP mid cervical paraspinals, R UT, R LS  Cervical ROM  ROM ROM  (Eval)  Flexion n  Extension n  Right lateral flexion N* L UT  Left lateral flexion N* R UT  Right rotation N*  Left rotation N*  Flexion rotation (normal is 30 degrees)   Flexion rotation (normal is 30 degrees)     (Blank rows = not tested, N = WNL, * = concordant pain)  UPPER EXTREMITY MMT:  MMT Right (Eval) Left (Eval)  Shoulder flexion 4* 4*  Shoulder abduction (C5)    Shoulder ER    Shoulder IR    Middle trapezius 4* 4*  Lower trapezius    Shoulder extension    Grip strength    Shoulder shrug (C4)    Elbow flexion (C6)    Elbow ext (C7)    Thumb ext (C8)    Finger abd (T1)    Grossly     (Blank rows = not tested, score listed is out of 5 possible points.  N = WNL, D = diminished, C = clear for gross weakness with myotome testing, * = concordant pain with testing)   JOINT MOBILITY TESTING:  Joint mobility WNL  PATIENT SURVEYS:  NDI: 16/50 - 32% disability    TODAY'S TREATMENT:  [facility] Adult PT Treatment:                                                DATE: 01/03/2024  Neuromuscular Reeducation:  Seated CS Retraction , 3 sec x 10 into ball against wall Seated CS side bending isometric, 3 sec x 10 each  Seated Scapular Retraction unresisted, 2 x 10  Seated Horizontal Abduction with red TB, 2 x 10  Seated BIL ER with red TB, 2 x 10  Standing pull downs red TB, 2 x 10  Standing rows red TB, 2 x 10  UBE level 1, 3 min fwd/back, cueing for postural endurance and pacing      PATIENT EDUCATION ([place]/HM):  POC, diagnosis, prognosis, HEP, and outcome measures.  Pt educated via explanation, demonstration, and handout (HEP).  Pt confirms understanding verbally.  Discussed ideal side sleeping position with link sent though HEP.   HOME  EXERCISE PROGRAM:  Access Code: [email or web address] URL: [email or web address] Date: 01/03/2024 Prepared by: Mauri Reading  Program Notes [email or web address]  Exercises - Supine Cervical Retraction with Towel  - 2 x daily - 7 x weekly - 2 sets - 10 reps - 5 hold - Seated Passive Cervical Retraction  - 2 x daily - 7 x weekly - 2 sets - 10 reps - 5 sec hold - Seated Scapular Retraction  - 1 x daily - 7 x weekly - 2 sets - 10 reps - 3 sec hold - Standing Shoulder Horizontal  Abduction with Resistance  - 1 x daily - 7 x weekly - 3 sets - 10 reps - Shoulder External Rotation and Scapular Retraction with Resistance  - 1 x daily - 7 x weekly - 3 sets - 10 reps  Treatment priorities   Eval        Manual         Cervical and periscapular strengthening                                  ASSESSMENT:  CLINICAL IMPRESSION:  01/03/2024 Robin Nguyen had fair tolerance of exercises today. She does report muscle fatigue with all exercises today. She required verbal cues and occasional tactile cues for improved overall postural awareness. Updated HEP and reviewed with patient. Patient will continue to benefit from skilled PT to address relevant deficits and maximize functional independence.   Eval: Robin Nguyen is a 33 y.o. female who presents to clinic with signs and sxs consistent with neck pain.  Most consistent with myofacial pain generator given full cervical ROM, normal cervical segmental testing, and no signs of radiculopathy.  Discussed ideal sleeping posture.  Pt will benefit from skilled PT to address relevant deficits and improve comfort with daily activities.    OBJECTIVE IMPAIRMENTS: Pain, UE strength  ACTIVITY LIMITATIONS: head turns, sleeping, reading  PERSONAL FACTORS: See medical history and pertinent history   REHAB POTENTIAL: Good  CLINICAL DECISION MAKING: Stable/uncomplicated  EVALUATION COMPLEXITY: Low   GOALS:   SHORT TERM GOALS: Target date:  01/24/2024   Robin Nguyen will be >75% HEP compliant to improve carryover between sessions and facilitate independent management of condition  Evaluation: ongoing Goal status: INITIAL   LONG TERM GOALS: Target date: 02/21/2024   Robin Nguyen will self report >/= 50% decrease in pain from evaluation to improve function in daily tasks  Evaluation/Baseline: 10/10 max pain Goal status: INITIAL   2.  Robin Nguyen will be able to demonstrate full cervical rotation, not limited by pain  Evaluation/Baseline: as much as 10/10 pain Goal status: INITIAL   3.  Robin Nguyen will improve the following MMTs to >/= 4+/5 to show improvement in strength:  mid trap, shoulder flexion   Evaluation/Baseline: see chart in note Goal status: INITIAL   4.  Robin Nguyen will report confidence in self management of condition at time of discharge with advanced HEP  Evaluation/Baseline: unable to self manage Goal status: INITIAL    PLAN: PT FREQUENCY: 1-2x/week  PT DURATION: 8 weeks  PLANNED INTERVENTIONS:  97164- PT Re-evaluation, 97110-Therapeutic exercises, 97530- Therapeutic activity, 97112- Neuromuscular re-education, 97535- Self Care, 09811- Manual therapy, [id number]- Gait training, [id number]- Aquatic Therapy, [id number]- Electrical stimulation (manual), [id number]- Vasopneumatic device, [id number]- Traction (mechanical), [id number]- Ionotophoresis 4mg /ml Dexamethasone, Taping, Dry Needling, Joint manipulation, and Spinal manipulation.   Mauri Reading, PT, DPT  01/03/2024 10:00 AM

## 2024-01-09 ENCOUNTER — Ambulatory Visit

## 2024-01-09 DIAGNOSIS — M6281 Muscle weakness (generalized): Secondary | ICD-10-CM

## 2024-01-09 DIAGNOSIS — M542 Cervicalgia: Secondary | ICD-10-CM | POA: Diagnosis not present

## 2024-01-09 NOTE — Therapy (Signed)
 OUTPATIENT PHYSICAL THERAPY TREATMENT NOTE   Patient Name: Robin Nguyen MRN: [id number] DOB:Apr 15, 1991, 33 y.o., female Today's Date: 01/09/2024   PT End of Session - 01/09/24 0916     Visit Number 3    Date for PT Re-Evaluation 02/21/24    Authorization Type [employer] MCD    PT Start Time 0915    PT Stop Time 0953    PT Time Calculation (min) 38 min    Activity Tolerance Patient tolerated treatment well    Behavior During Therapy [facility] for tasks assessed/performed            Past Medical History:  Diagnosis Date   Depression    Gestational thrombocytopenia (HCC)    Headache    Hepatitis B    HSV (herpes simplex virus) anogenital infection    Migraine    Placenta previa 01/10/2015   Past Surgical History:  Procedure Laterality Date   CESAREAN SECTION N/A 02/03/2015   Procedure: CESAREAN SECTION;  Surgeon: Myna Hidalgo, DO;  Location: [facility];  Service: Obstetrics;  Laterality: N/A;   CESAREAN SECTION WITH BILATERAL TUBAL LIGATION N/A 01/17/2017   Procedure: CESAREAN SECTION WITH BILATERAL TUBAL LIGATION;  Surgeon: Myna Hidalgo, DO;  Location: [facility];  Service: Obstetrics;  Laterality: N/A;  EDD 01/24/17   Patient Active Problem List   Diagnosis Date Noted   Family history of mixed hyperlipidemia 12/10/2023   Encounter for screening for HIV 12/10/2023   Screening for cervical cancer 12/10/2023   Encounter for general adult medical examination w/o abnormal findings 12/10/2023   Neck pain on left side 11/12/2023   Establishing care with new doctor, encounter for 11/12/2023   Encounter for initial prescription of contraceptive pills 11/12/2023   Hepatitis B carrier (HCC) 11/07/2023   Moderate episode of recurrent major depressive disorder (HCC) 11/07/2023   Chlamydia infection 12/13/2020   Need for COVID-19 vaccine 12/13/2020   Severe episode of recurrent major depressive disorder, without psychotic features (HCC)    Normal intrauterine pregnancy in third  trimester 01/17/2017   Chronic migraine 08/09/2016   Depression, major, single episode, moderate (HCC) 05/29/2015   Status post primary low transverse cesarean section 02/04/2015   Low lying placenta without hemorrhage, antepartum 02/02/2015   IUGR (intrauterine growth restriction) 02/02/2015   Gestational thrombocytopenia without hemorrhage (HCC) 02/02/2015   Hepatitis B infection 05/01/2014    PCP: Ellender Hose, NP  REFERRING PROVIDER: Ellender Hose, NP  THERAPY DIAG:  Cervicalgia  Muscle weakness  REFERRING DIAG: Chronic pain of both shoulders [M25.511, G89.29, M25.512], Cervicalgia [M54.2], Myofascial pain syndrome [M79.18]   Rationale for Evaluation and Treatment:  Rehabilitation  SUBJECTIVE:  PERTINENT PAST HISTORY:  Depression, hepatitis B        PRECAUTIONS: Hep B  WEIGHT BEARING RESTRICTIONS No  FALLS:  Has patient fallen in last 6 months? No, Number of falls: 0  MOI/History of condition:  Onset date: ~6 months  SUBJECTIVE STATEMENT Patient reports that her pain is at a 9/10 today on BIL sides of neck, felt ok after last session. She states sleeping has improved with removal of pillow.  EVAL: Nicol Herbig is a 33 y.o. female who presents to clinic with chief complaint of neck pain which started October of 2024.  She cannot recall any specific injury or inciting event.  The pain only occurs when she turns her head to the side.  She denies any n/t or weakness in UE.  It can vary daily from mild/moderate discomfort to severe.   Red flags:  denies   Pain:  Are you having pain? Yes Pain location: cx spine NPRS scale:  3/10 to 10/10 Aggravating factors: rotation  Relieving factors: medication (a little) Pain description: sharp and aching Stage: Chronic 24 hour pattern: worse in morning   Occupation: works with disabled adults  Assistive Device: na  Hand Dominance: R  Patient Goals/Specific Activities: reduce pain   OBJECTIVE:   DIAGNOSTIC  FINDINGS:  No findings on X-ray  GENERAL OBSERVATION: Forward head     SENSATION: Light touch: Appears intact   PALPATION: TTP mid cervical paraspinals, R UT, R LS  Cervical ROM  ROM ROM  (Eval)  Flexion n  Extension n  Right lateral flexion N* L UT  Left lateral flexion N* R UT  Right rotation N*  Left rotation N*  Flexion rotation (normal is 30 degrees)   Flexion rotation (normal is 30 degrees)     (Blank rows = not tested, N = WNL, * = concordant pain)  UPPER EXTREMITY MMT:  MMT Right (Eval) Left (Eval)  Shoulder flexion 4* 4*  Shoulder abduction (C5)    Shoulder ER    Shoulder IR    Middle trapezius 4* 4*  Lower trapezius    Shoulder extension    Grip strength    Shoulder shrug (C4)    Elbow flexion (C6)    Elbow ext (C7)    Thumb ext (C8)    Finger abd (T1)    Grossly     (Blank rows = not tested, score listed is out of 5 possible points.  N = WNL, D = diminished, C = clear for gross weakness with myotome testing, * = concordant pain with testing)   JOINT MOBILITY TESTING:  Joint mobility WNL  PATIENT SURVEYS:  NDI: 16/50 - 32% disability    TODAY'S TREATMENT:  [facility] Adult PT Treatment:                                                DATE: 01/09/24 Therapeutic Exercise: UBE level 1, 3 min fwd/back, cueing for postural endurance and pacing  Seated upper trap stretch 2x30" BIL Seated LS stretch 2x30" BIL Neuromuscular re-ed: Seated Scapular Retraction unresisted 3" hold, 2 x 10  Supine alternating shoulder flexion/extension 1# 2x10 Supine chin tuck into pillow 3" hold 2x10 Supine BIL chest press 1# DB 2x10 Seated Horizontal Abduction with red TB, 2 x 10  Seated BIL ER with red TB, 2 x 10  Standing pull downs red TB, 2 x 10  Standing rows red TB, 2 x 10    [facility] Adult PT Treatment:                                                DATE: 01/03/2024  Neuromuscular Reeducation:  Seated CS Retraction , 3 sec x 10 into ball against wall Seated CS side  bending isometric, 3 sec x 10 each  Seated Scapular Retraction unresisted, 2 x 10  Seated Horizontal Abduction with red TB, 2 x 10  Seated BIL ER with red TB, 2 x 10  Standing pull downs red TB, 2 x 10  Standing rows red TB, 2 x 10  UBE level 1, 3 min fwd/back, cueing for postural  endurance and pacing      PATIENT EDUCATION ([place]/HM):  POC, diagnosis, prognosis, HEP, and outcome measures.  Pt educated via explanation, demonstration, and handout (HEP).  Pt confirms understanding verbally.  Discussed ideal side sleeping position with link sent though HEP.   HOME EXERCISE PROGRAM:  Access Code: [email or web address] URL: [email or web address] Date: 01/03/2024 Prepared by: Mauri Reading  Program Notes [email or web address]  Exercises - Supine Cervical Retraction with Towel  - 2 x daily - 7 x weekly - 2 sets - 10 reps - 5 hold - Seated Passive Cervical Retraction  - 2 x daily - 7 x weekly - 2 sets - 10 reps - 5 sec hold - Seated Scapular Retraction  - 1 x daily - 7 x weekly - 2 sets - 10 reps - 3 sec hold - Standing Shoulder Horizontal Abduction with Resistance  - 1 x daily - 7 x weekly - 3 sets - 10 reps - Shoulder External Rotation and Scapular Retraction with Resistance  - 1 x daily - 7 x weekly - 3 sets - 10 reps  Treatment priorities   Eval        Manual         Cervical and periscapular strengthening                                  ASSESSMENT:  CLINICAL IMPRESSION: Patient presents to PT with increased pain, 9/10, on BIL sides of her neck today, nothing in particular that she noticed increased the pain. Has been compliant with HEP. Session today continued to focus on periscapular strengthening and cervical mobility and stretching. She requires occasional cues throughout session for form and pacing. Patient was able to tolerate all prescribed exercises with no adverse effects. Patient continues to benefit from skilled PT services and should be  progressed as able to improve functional independence.   Eval: Sharada is a 33 y.o. female who presents to clinic with signs and sxs consistent with neck pain.  Most consistent with myofacial pain generator given full cervical ROM, normal cervical segmental testing, and no signs of radiculopathy.  Discussed ideal sleeping posture.  Pt will benefit from skilled PT to address relevant deficits and improve comfort with daily activities.    OBJECTIVE IMPAIRMENTS: Pain, UE strength  ACTIVITY LIMITATIONS: head turns, sleeping, reading  PERSONAL FACTORS: See medical history and pertinent history   REHAB POTENTIAL: Good  CLINICAL DECISION MAKING: Stable/uncomplicated  EVALUATION COMPLEXITY: Low   GOALS:   SHORT TERM GOALS: Target date: 01/24/2024   Reeshemah will be >75% HEP compliant to improve carryover between sessions and facilitate independent management of condition  Evaluation: ongoing Goal status: INITIAL   LONG TERM GOALS: Target date: 02/21/2024   Zaryiah will self report >/= 50% decrease in pain from evaluation to improve function in daily tasks  Evaluation/Baseline: 10/10 max pain Goal status: INITIAL   2.  Sacred will be able to demonstrate full cervical rotation, not limited by pain  Evaluation/Baseline: as much as 10/10 pain Goal status: INITIAL   3.  Lively will improve the following MMTs to >/= 4+/5 to show improvement in strength:  mid trap, shoulder flexion   Evaluation/Baseline: see chart in note Goal status: INITIAL   4.  Electra will report confidence in self management of condition at time of discharge with advanced HEP  Evaluation/Baseline: unable to self manage Goal status: INITIAL    PLAN: PT FREQUENCY: 1-2x/week  PT  DURATION: 8 weeks  PLANNED INTERVENTIONS:  97164- PT Re-evaluation, 97110-Therapeutic exercises, 97530- Therapeutic activity, [id number]- Neuromuscular re-education, 97535- Self Care, 66440- Manual therapy, [id number]- Gait training, [phone]-  Aquatic Therapy, [phone]- Electrical stimulation (manual), [id number]- Vasopneumatic device, [id number]- Traction (mechanical), [id number]- Ionotophoresis 4mg /ml Dexamethasone, Taping, Dry Needling, Joint manipulation, and Spinal manipulation.   Berta Minor PTA   01/09/2024 9:53 AM

## 2024-01-16 ENCOUNTER — Ambulatory Visit: Attending: Physical Medicine and Rehabilitation

## 2024-01-16 ENCOUNTER — Telehealth: Payer: Self-pay

## 2024-01-16 DIAGNOSIS — M542 Cervicalgia: Secondary | ICD-10-CM | POA: Insufficient documentation

## 2024-01-16 DIAGNOSIS — M6281 Muscle weakness (generalized): Secondary | ICD-10-CM | POA: Insufficient documentation

## 2024-01-16 NOTE — Telephone Encounter (Signed)
 Spoke with patient regarding missed appointment. Confirmed next appointment time.  1st no-show  Berta Minor, [place] 01/16/24 5:25 PM

## 2024-01-23 ENCOUNTER — Ambulatory Visit

## 2024-01-23 DIAGNOSIS — M542 Cervicalgia: Secondary | ICD-10-CM

## 2024-01-23 DIAGNOSIS — M6281 Muscle weakness (generalized): Secondary | ICD-10-CM | POA: Diagnosis present

## 2024-01-23 NOTE — Therapy (Signed)
 OUTPATIENT PHYSICAL THERAPY TREATMENT NOTE   Patient Name: Robin Nguyen MRN: [id number] DOB:[birth year], 33 y.o., female Today's Date: 01/23/2024   PT End of Session - 01/23/24 1002     Visit Number 4    Date for PT Re-Evaluation 02/21/24    Authorization Type [employer] MCD    PT Start Time 1002    PT Stop Time 1040    PT Time Calculation (min) 38 min    Activity Tolerance Patient tolerated treatment well    Behavior During Therapy WFL for tasks assessed/performed             Past Medical History:  Diagnosis Date   Depression    Gestational thrombocytopenia (HCC)    Headache    Hepatitis B    HSV (herpes simplex virus) anogenital infection    Migraine    Placenta previa 01/10/2015   Past Surgical History:  Procedure Laterality Date   CESAREAN SECTION N/A 02/03/2015   Procedure: CESAREAN SECTION;  Surgeon: Myna Hidalgo, DO;  Location: [facility];  Service: Obstetrics;  Laterality: N/A;   CESAREAN SECTION WITH BILATERAL TUBAL LIGATION N/A 01/17/2017   Procedure: CESAREAN SECTION WITH BILATERAL TUBAL LIGATION;  Surgeon: Myna Hidalgo, DO;  Location: [facility];  Service: Obstetrics;  Laterality: N/A;  EDD 01/24/17   Patient Active Problem List   Diagnosis Date Noted   Family history of mixed hyperlipidemia 12/10/2023   Encounter for screening for HIV 12/10/2023   Screening for cervical cancer 12/10/2023   Encounter for general adult medical examination w/o abnormal findings 12/10/2023   Neck pain on left side 11/12/2023   Establishing care with new doctor, encounter for 11/12/2023   Encounter for initial prescription of contraceptive pills 11/12/2023   Hepatitis B carrier (HCC) 11/07/2023   Moderate episode of recurrent major depressive disorder (HCC) 11/07/2023   Chlamydia infection 12/13/2020   Need for COVID-19 vaccine 12/13/2020   Severe episode of recurrent major depressive disorder, without psychotic features (HCC)    Normal intrauterine pregnancy in third  trimester 01/17/2017   Chronic migraine 08/09/2016   Depression, major, single episode, moderate (HCC) 05/29/2015   Status post primary low transverse cesarean section 02/04/2015   Low lying placenta without hemorrhage, antepartum 02/02/2015   IUGR (intrauterine growth restriction) 02/02/2015   Gestational thrombocytopenia without hemorrhage (HCC) 02/02/2015   Hepatitis B infection 05/01/2014    PCP: Ellender Hose, NP  REFERRING PROVIDER: Ellender Hose, NP  THERAPY DIAG:  Cervicalgia  Muscle weakness  REFERRING DIAG: Chronic pain of both shoulders [M25.511, G89.29, M25.512], Cervicalgia [M54.2], Myofascial pain syndrome [M79.18]   Rationale for Evaluation and Treatment:  Rehabilitation  SUBJECTIVE:  PERTINENT PAST HISTORY:  Depression, hepatitis B        PRECAUTIONS: Hep B  WEIGHT BEARING RESTRICTIONS No  FALLS:  Has patient fallen in last 6 months? No, Number of falls: 0  MOI/History of condition:  Onset date: ~6 months  SUBJECTIVE STATEMENT Patient reports that she has been feeling a little better. Her pain severity at start of session is 7/10.   EVAL: Robin Nguyen is a 33 y.o. female who presents to clinic with chief complaint of neck pain which started October of 2024.  She cannot recall any specific injury or inciting event.  The pain only occurs when she turns her head to the side.  She denies any n/t or weakness in UE.  It can vary daily from mild/moderate discomfort to severe.   Red flags:  denies   Pain:  Are you  having pain? Yes Pain location: cx spine NPRS scale:  3/10 to 10/10 Aggravating factors: rotation  Relieving factors: medication (a little) Pain description: sharp and aching Stage: Chronic 24 hour pattern: worse in morning   Occupation: works with disabled adults  Assistive Device: na  Hand Dominance: R  Patient Goals/Specific Activities: reduce pain   OBJECTIVE:   DIAGNOSTIC FINDINGS:  No findings on X-ray  GENERAL  OBSERVATION: Forward head     SENSATION: Light touch: Appears intact   PALPATION: TTP mid cervical paraspinals, R UT, R LS  Cervical ROM  ROM ROM  (Eval)  Flexion n  Extension n  Right lateral flexion N* L UT  Left lateral flexion N* R UT  Right rotation N*  Left rotation N*  Flexion rotation (normal is 30 degrees)   Flexion rotation (normal is 30 degrees)     (Blank rows = not tested, N = WNL, * = concordant pain)  UPPER EXTREMITY MMT:  MMT Right (Eval) Left (Eval)  Shoulder flexion 4* 4*  Shoulder abduction (C5)    Shoulder ER    Shoulder IR    Middle trapezius 4* 4*  Lower trapezius    Shoulder extension    Grip strength    Shoulder shrug (C4)    Elbow flexion (C6)    Elbow ext (C7)    Thumb ext (C8)    Finger abd (T1)    Grossly     (Blank rows = not tested, score listed is out of 5 possible points.  N = WNL, D = diminished, C = clear for gross weakness with myotome testing, * = concordant pain with testing)   JOINT MOBILITY TESTING:  Joint mobility WNL  PATIENT SURVEYS:  NDI: 16/50 - 32% disability    TODAY'S TREATMENT:    [facility] Adult PT Treatment:                                                DATE: 01/23/2024  Therapeutic Exercise: UBE for warm up, concurrent with subjective assessment  Forward level 3, 2 min  Backward level 2, 2 min   Neuromuscular re-ed: Seated Scapular Retraction unresisted 3" hold, 2 x 10  Supine alternating shoulder flexion/extension 2# 2x10 Seated chin tuck 3" hold 2x10 Supine BIL chest press 2# DB 2x10 Seated Horizontal Abduction with red TB, 2 x 10  Seated BIL ER with red TB, 2 x 10  Seated Rows 20#, 2 x 10  Seated High Rows 20#, 2 x 10 Wall slide + scapular setting x 15      PATIENT EDUCATION ([place]/HM):  POC, diagnosis, prognosis, HEP, and outcome measures.  Pt educated via explanation, demonstration, and handout (HEP).  Pt confirms understanding verbally.  Discussed ideal side sleeping position with link sent  though HEP.   HOME EXERCISE PROGRAM:  Access Code: [email or web address] URL: [email or web address] Date: 01/03/2024 Prepared by: Mauri Reading  Program Notes [email or web address]  Exercises - Supine Cervical Retraction with Towel  - 2 x daily - 7 x weekly - 2 sets - 10 reps - 5 hold - Seated Passive Cervical Retraction  - 2 x daily - 7 x weekly - 2 sets - 10 reps - 5 sec hold - Seated Scapular Retraction  - 1 x daily - 7 x weekly - 2 sets - 10 reps - 3 sec hold - Standing Shoulder Horizontal Abduction  with Resistance  - 1 x daily - 7 x weekly - 3 sets - 10 reps - Shoulder External Rotation and Scapular Retraction with Resistance  - 1 x daily - 7 x weekly - 3 sets - 10 reps  Treatment priorities   Eval        Manual         Cervical and periscapular strengthening                                  ASSESSMENT:  CLINICAL IMPRESSION: Patient continues to require frequent cueing to improved mm activation patterns. Today's session focused on periscapular strengthening and stabilization activities. We will continue to progress with strength and stabilization activities, using only cervical mobility for warm up and pain modulation.     Eval: Clio is a 33 y.o. female who presents to clinic with signs and sxs consistent with neck pain.  Most consistent with myofacial pain generator given full cervical ROM, normal cervical segmental testing, and no signs of radiculopathy.  Discussed ideal sleeping posture.  Pt will benefit from skilled PT to address relevant deficits and improve comfort with daily activities.    OBJECTIVE IMPAIRMENTS: Pain, UE strength  ACTIVITY LIMITATIONS: head turns, sleeping, reading  PERSONAL FACTORS: See medical history and pertinent history   REHAB POTENTIAL: Good  CLINICAL DECISION MAKING: Stable/uncomplicated  EVALUATION COMPLEXITY: Low   GOALS:   SHORT TERM GOALS: Target date: 01/24/2024   Fernanda will be >75% HEP  compliant to improve carryover between sessions and facilitate independent management of condition  Evaluation: ongoing Goal status: INITIAL   LONG TERM GOALS: Target date: 02/21/2024   Alexander will self report >/= 50% decrease in pain from evaluation to improve function in daily tasks  Evaluation/Baseline: 10/10 max pain Goal status: INITIAL   2.  Danelle will be able to demonstrate full cervical rotation, not limited by pain  Evaluation/Baseline: as much as 10/10 pain Goal status: INITIAL   3.  Lynetta will improve the following MMTs to >/= 4+/5 to show improvement in strength:  mid trap, shoulder flexion   Evaluation/Baseline: see chart in note Goal status: INITIAL   4.  Carys will report confidence in self management of condition at time of discharge with advanced HEP  Evaluation/Baseline: unable to self manage Goal status: INITIAL    PLAN: PT FREQUENCY: 1-2x/week  PT DURATION: 8 weeks  PLANNED INTERVENTIONS:  97164- PT Re-evaluation, 97110-Therapeutic exercises, 97530- Therapeutic activity, 97112- Neuromuscular re-education, 97535- Self Care, 86578- Manual therapy, [id number]- Gait training, [id number]- Aquatic Therapy, [id number]- Electrical stimulation (manual), [id number]- Vasopneumatic device, [id number]- Traction (mechanical), [id number]- Ionotophoresis 4mg /ml Dexamethasone, Taping, Dry Needling, Joint manipulation, and Spinal manipulation.   Mauri Reading, PT, DPT  01/23/2024 1:39 PM

## 2024-01-30 ENCOUNTER — Ambulatory Visit

## 2024-02-05 ENCOUNTER — Telehealth: Payer: Self-pay

## 2024-02-05 ENCOUNTER — Ambulatory Visit: Payer: Self-pay

## 2024-02-05 NOTE — Telephone Encounter (Signed)
 LVM regarding missed appt. Reminder of attendance policy and next schedule PT session. - MJ

## 2024-02-12 ENCOUNTER — Ambulatory Visit: Payer: Self-pay

## 2024-02-12 DIAGNOSIS — M542 Cervicalgia: Secondary | ICD-10-CM

## 2024-02-12 DIAGNOSIS — M6281 Muscle weakness (generalized): Secondary | ICD-10-CM

## 2024-02-12 NOTE — Therapy (Addendum)
 PHYSICAL THERAPY DISCHARGE SUMMARY  Visits from Start of Care: 5  Patient is being discharged due to  attendance policy. Patient has no-showed 3 appointments, without calling the clinic or notifying via MyChart. She will require new referral to return to OP PT with our location .   Robin Nguyen, PT, DPT  02/20/2024 11:08 AM   OUTPATIENT PHYSICAL THERAPY TREATMENT NOTE   Patient Name: Robin Nguyen MRN: [id number] DOB:04/21/1991, 33 y.o., female Today's Date: 02/12/2024   PT End of Session - 02/12/24 0915     Visit Number 5    Date for PT Re-Evaluation 02/21/24    Authorization Type [employer] MCD    PT Start Time 0915    PT Stop Time 0955    PT Time Calculation (min) 40 min    Activity Tolerance Patient tolerated treatment well    Behavior During Therapy [facility] for tasks assessed/performed              Past Medical History:  Diagnosis Date   Depression    Gestational thrombocytopenia (HCC)    Headache    Hepatitis B    HSV (herpes simplex virus) anogenital infection    Migraine    Placenta previa 01/10/2015   Past Surgical History:  Procedure Laterality Date   CESAREAN SECTION N/A 02/03/2015   Procedure: CESAREAN SECTION;  Surgeon: Keene Pastures, DO;  Location: [facility];  Service: Obstetrics;  Laterality: N/A;   CESAREAN SECTION WITH BILATERAL TUBAL LIGATION N/A 01/17/2017   Procedure: CESAREAN SECTION WITH BILATERAL TUBAL LIGATION;  Surgeon: Jennifer Ozan, DO;  Location: [facility];  Service: Obstetrics;  Laterality: N/A;  EDD 01/24/17   Patient Active Problem List   Diagnosis Date Noted   Family history of mixed hyperlipidemia 12/10/2023   Encounter for screening for HIV 12/10/2023   Screening for cervical cancer 12/10/2023   Encounter for general adult medical examination w/o abnormal findings 12/10/2023   Neck pain on left side 11/12/2023   Establishing care with new doctor, encounter for 11/12/2023   Encounter for initial prescription of contraceptive pills  11/12/2023   Hepatitis B carrier (HCC) 11/07/2023   Moderate episode of recurrent major depressive disorder (HCC) 11/07/2023   Chlamydia infection 12/13/2020   Need for COVID-19 vaccine 12/13/2020   Severe episode of recurrent major depressive disorder, without psychotic features (HCC)    Normal intrauterine pregnancy in third trimester 01/17/2017   Chronic migraine 08/09/2016   Depression, major, single episode, moderate (HCC) 05/29/2015   Status post primary low transverse cesarean section 02/04/2015   Low lying placenta without hemorrhage, antepartum 02/02/2015   IUGR (intrauterine growth restriction) 02/02/2015   Gestational thrombocytopenia without hemorrhage (HCC) 02/02/2015   Hepatitis B infection 05/01/2014    PCP: Melodie Spry, NP  REFERRING PROVIDER: Darryll Eng, NP  THERAPY DIAG:  Cervicalgia  Muscle weakness  REFERRING DIAG: Chronic pain of both shoulders [M25.511, G89.29, M25.512], Cervicalgia [M54.2], Myofascial pain syndrome [M79.18]   Rationale for Evaluation and Treatment:  Rehabilitation  SUBJECTIVE:  PERTINENT PAST HISTORY:  Depression, hepatitis B        PRECAUTIONS: Hep B  WEIGHT BEARING RESTRICTIONS No  FALLS:  Has patient fallen in last 6 months? No, Number of falls: 0  MOI/History of condition:  Onset date: ~6 months  SUBJECTIVE STATEMENT Patient reports no pain at start of session. She does continue to have up to 9/10 in the mornings.   EVAL: Robin Nguyen is a 33 y.o. female who presents to clinic with chief complaint  of neck pain which started October of 2024.  She cannot recall any specific injury or inciting event.  The pain only occurs when she turns her head to the side.  She denies any n/t or weakness in UE.  It can vary daily from mild/moderate discomfort to severe.   Red flags:  denies   Pain:  Are you having pain? Yes Pain location: cx spine NPRS scale:  3/10 to 10/10 Aggravating factors: rotation  Relieving  factors: medication (a little) Pain description: sharp and aching Stage: Chronic 24 hour pattern: worse in morning   Occupation: works with disabled adults  Assistive Device: na  Hand Dominance: R  Patient Goals/Specific Activities: reduce pain   OBJECTIVE:   DIAGNOSTIC FINDINGS:  No findings on X-ray  GENERAL OBSERVATION: Forward head     SENSATION: Light touch: Appears intact   PALPATION: TTP mid cervical paraspinals, R UT, R LS  Cervical ROM  ROM ROM  (Eval)   Flexion n WFL  Extension n WFL  Right lateral flexion N* L UT WFL  Left lateral flexion N* R UT WFL, p! R side of head  Right rotation N* WFL* p! R side of neck  Left rotation N* WFL  Flexion rotation (normal is 30 degrees)    Flexion rotation (normal is 30 degrees)      (Blank rows = not tested, N = WNL, * = concordant pain)  UPPER EXTREMITY MMT:  MMT Right (Eval) Left (Eval)  Shoulder flexion 4* 4*  Shoulder abduction (C5)    Shoulder ER    Shoulder IR    Middle trapezius 4* 4*  Lower trapezius    Shoulder extension    Grip strength    Shoulder shrug (C4)    Elbow flexion (C6)    Elbow ext (C7)    Thumb ext (C8)    Finger abd (T1)    Grossly     (Blank rows = not tested, score listed is out of 5 possible points.  N = WNL, D = diminished, C = clear for gross weakness with myotome testing, * = concordant pain with testing)   JOINT MOBILITY TESTING:  Joint mobility WNL  PATIENT SURVEYS:  NDI: 16/50 - 32% disability    TODAY'S TREATMENT:    [facility] Adult PT Treatment:                                                DATE: 01/23/2024  Therapeutic Exercise: UBE level 2, 2 min ea fwd/back for warm up, concurrent with subjective assessment  Forward level 3, 2 min  Backward level 2, 2 min   Neuromuscular re-ed: Seated Scapular Retraction unresisted 3" hold x 10  Supine alternating shoulder flexion/extension 2# 2x10 Supine horizontal abduction with 2# DB 2 x 10  Supine BIL chest press 2#  DB 2x10 Supine shoulder extension with GTB, 2 x 10 Seated chin tuck 3" hold 2x10  Seated chin tuck + CS rotation x 5  Seated BIL ER with green TB, 2 x 10  Wall slide + scapular setting x 15 RTB Wall clocks x 4 with RTB Extensive tactile and verbal cues required      PATIENT EDUCATION ([place]/[facility]):  POC, diagnosis, prognosis, HEP, and outcome measures.  Pt educated via explanation, demonstration, and handout (HEP).  Pt confirms understanding verbally.  Discussed ideal side sleeping position with  link sent though HEP.   HOME EXERCISE PROGRAM:  Access Code: [email or web address] URL: [email or web address] Date: 01/03/2024 Prepared by: Robin Nguyen  Program Notes [email or web address]  Exercises - Supine Cervical Retraction with Towel  - 2 x daily - 7 x weekly - 2 sets - 10 reps - 5 hold - Seated Passive Cervical Retraction  - 2 x daily - 7 x weekly - 2 sets - 10 reps - 5 sec hold - Seated Scapular Retraction  - 1 x daily - 7 x weekly - 2 sets - 10 reps - 3 sec hold - Standing Shoulder Horizontal Abduction with Resistance  - 1 x daily - 7 x weekly - 3 sets - 10 reps - Shoulder External Rotation and Scapular Retraction with Resistance  - 1 x daily - 7 x weekly - 3 sets - 10 reps  Treatment priorities   Eval        Manual         Cervical and periscapular strengthening                                  ASSESSMENT:  CLINICAL IMPRESSION: Dorothy had improved tolerance of today's treatment session, which focused on progression of periscapular strengthening and CS mobility. Patient continues to require frequent cueing to improved mm activation patterns. We will continue to progress per POC as tolerated, in order to reach established rehab goals. Plan is perform reassessment of progress towards established rehab goals at next visit as she will likely benefit from extended POC.    Eval: Heylin is a 33 y.o. female who presents to clinic with signs and sxs  consistent with neck pain.  Most consistent with myofacial pain generator given full cervical ROM, normal cervical segmental testing, and no signs of radiculopathy.  Discussed ideal sleeping posture.  Pt will benefit from skilled PT to address relevant deficits and improve comfort with daily activities.    OBJECTIVE IMPAIRMENTS: Pain, UE strength  ACTIVITY LIMITATIONS: head turns, sleeping, reading  PERSONAL FACTORS: See medical history and pertinent history   REHAB POTENTIAL: Good  CLINICAL DECISION MAKING: Stable/uncomplicated  EVALUATION COMPLEXITY: Low   GOALS:   SHORT TERM GOALS: Target date: 01/24/2024   Kimerly will be >75% HEP compliant to improve carryover between sessions and facilitate independent management of condition  Evaluation: ongoing Goal status: INITIAL   LONG TERM GOALS: Target date: 02/21/2024   Ylana will self report >/= 50% decrease in pain from evaluation to improve function in daily tasks  Evaluation/Baseline: 10/10 max pain 02/12/2024: Patient reports that her pain is about 9/10 when she wakes up in the morning, which gradually decreases throughout the day.  Goal status: INITIAL   2.  Charlett will be able to demonstrate full cervical rotation, not limited by pain  Evaluation/Baseline: as much as 10/10 pain 02/12/2024:  Goal status: INITIAL   3.  Britnie will improve the following MMTs to >/= 4+/5 to show improvement in strength:  mid trap, shoulder flexion   Evaluation/Baseline: see chart in note Goal status: INITIAL   4.  Beronica will report confidence in self management of condition at time of discharge with advanced HEP  Evaluation/Baseline: unable to self manage Goal status: INITIAL    PLAN: PT FREQUENCY: 1-2x/week  PT DURATION: 8 weeks  PLANNED INTERVENTIONS:  97164- PT Re-evaluation, 97110-Therapeutic exercises, 97530- Therapeutic activity, [id number]- Neuromuscular re-education, 97535- Self Care, 40981- Manual therapy, [id number]- Gait  training, [id number]- Aquatic  Therapy, [id number]- Electrical stimulation (manual), 16109- Vasopneumatic device, [id number]- Traction (mechanical), 60454- Ionotophoresis 4mg /ml Dexamethasone , Taping, Dry Needling, Joint manipulation, and Spinal manipulation.   Robin Nguyen, PT, DPT  02/12/2024 9:59 AM

## 2024-02-14 ENCOUNTER — Ambulatory Visit: Admitting: Physical Medicine and Rehabilitation

## 2024-02-19 ENCOUNTER — Ambulatory Visit: Payer: Self-pay

## 2024-02-20 ENCOUNTER — Ambulatory Visit: Attending: Physical Medicine and Rehabilitation

## 2024-03-20 ENCOUNTER — Other Ambulatory Visit (HOSPITAL_COMMUNITY)
Admission: RE | Admit: 2024-03-20 | Discharge: 2024-03-20 | Disposition: A | Discharge status: Home / Self Care | Source: Ambulatory Visit | Attending: Obstetrics and Gynecology | Admitting: Obstetrics and Gynecology

## 2024-03-20 ENCOUNTER — Ambulatory Visit: Admitting: Obstetrics and Gynecology

## 2024-03-20 ENCOUNTER — Encounter: Payer: Self-pay | Admitting: Obstetrics and Gynecology

## 2024-03-20 VITALS — BP 123/87 | HR 93 | Wt 147.9 lb

## 2024-03-20 DIAGNOSIS — Z1331 Encounter for screening for depression: Secondary | ICD-10-CM

## 2024-03-20 DIAGNOSIS — R87610 Atypical squamous cells of undetermined significance on cytologic smear of cervix (ASC-US): Secondary | ICD-10-CM | POA: Insufficient documentation

## 2024-03-20 DIAGNOSIS — N870 Mild cervical dysplasia: Secondary | ICD-10-CM | POA: Diagnosis not present

## 2024-03-20 NOTE — Progress Notes (Signed)
 NEW GYNECOLOGY PATIENT Patient name: Robin Nguyen MRN [id number]  Date of birth: [birth year] Chief Complaint:   Procedure     History:  Robin Nguyen is a 33 y.o. G2P2002 being seen today for abnormal pap.  Noted to have had repeat ASCUS pap smears with prior positive HPV. Ok with proceeding with colposcopy today.      Gynecologic History Patient's last menstrual period was 03/10/2024 (approximate). Contraception: tubal ligation Last Pap:     Component Value Date/Time   DIAGPAP (A) 12/07/2023 1047    - Atypical squamous cells of undetermined significance (ASC-US )   DIAGPAP (A) 12/01/2020 1314    - Atypical squamous cells of undetermined significance (ASC-US )   DIAGPAP  02/28/2017 0000    NEGATIVE FOR INTRAEPITHELIAL LESIONS OR MALIGNANCY.   DIAGPAP  02/28/2017 0000    A LETTER WAS SENT TO THE PATIENT INFORMING HER OF THE ABOVE RESULTS.   HPVHIGH Negative 12/07/2023 1047   HPVHIGH Positive (A) 12/01/2020 1314   ADEQPAP  12/07/2023 1047    Satisfactory for evaluation; transformation zone component PRESENT.   ADEQPAP  12/01/2020 1314    Satisfactory for evaluation; transformation zone component PRESENT.   ADEQPAP  02/28/2017 0000    Satisfactory for evaluation  endocervical/transformation zone component PRESENT.    Last Mammogram: n/a Last Colonoscopy: n/a  Obstetric History OB History  Gravida Para Term Preterm AB Living  2 2 2   2   SAB IAB Ectopic Multiple Live Births     0 2    # Outcome Date GA Lbr Len/2nd Weight Sex Type Anes PTL Lv  2 Term 01/17/17 [redacted]w[redacted]d  7 lb 11 oz (3.487 kg) M CS-LTranv Gen N LIV  1 Term 02/03/15 [redacted]w[redacted]d  5 lb 12.4 oz (2.62 kg) F CS-LTranv EPI  LIV    Past Medical History:  Diagnosis Date   Depression    Gestational thrombocytopenia (HCC)    Headache    Hepatitis B    HSV (herpes simplex virus) anogenital infection    Migraine    Placenta previa 01/10/2015    Past Surgical History:  Procedure Laterality Date   CESAREAN SECTION N/A  02/03/2015   Procedure: CESAREAN SECTION;  Surgeon: Keene Pastures, DO;  Location: [facility];  Service: Obstetrics;  Laterality: N/A;   CESAREAN SECTION WITH BILATERAL TUBAL LIGATION N/A 01/17/2017   Procedure: CESAREAN SECTION WITH BILATERAL TUBAL LIGATION;  Surgeon: Jennifer Ozan, DO;  Location: [facility];  Service: Obstetrics;  Laterality: N/A;  EDD 01/24/17    Current Outpatient Medications on File Prior to Visit  Medication Sig Dispense Refill   meloxicam  (MOBIC ) 15 MG tablet Take 1 tablet (15 mg total) by mouth daily. 30 tablet 0   Norethindrone Acetate-Ethinyl Estrad-FE (AUROVELA  24 FE) 1-20 MG-MCG(24) tablet Take 1 tablet by mouth daily. 28 tablet 11   ondansetron  (ZOFRAN ) 4 MG tablet Take 1 tablet (4 mg total) by mouth every 6 (six) hours. 12 tablet 0   sertraline  (ZOLOFT ) 50 MG tablet Take 1 tablet (50 mg total) by mouth daily. 90 tablet 3   tiZANidine  (ZANAFLEX ) 4 MG tablet Take 1 tablet (4 mg total) by mouth at bedtime. 30 tablet 0   No current facility-administered medications on file prior to visit.    No Known Allergies  Social History:  reports that she has never smoked. She has never used smokeless tobacco. She reports that she does not drink alcohol and does not use drugs.  Family History  Problem Relation Age of Onset  Hypertension Mother    Healthy Father     The following portions of the patient's history were reviewed and updated as appropriate: allergies, current medications, past family history, past medical history, past social history, past surgical history and problem list.  Review of Systems Pertinent items noted in HPI and remainder of comprehensive ROS otherwise negative.  Physical Exam:  BP 123/87   Pulse 93   Wt 147 lb 14.4 oz (67.1 kg)   LMP 03/10/2024 (Approximate)   BMI 26.20 kg/m  Physical Exam   GYNECOLOGY OFFICE COLPOSCOPY PROCEDURE NOTE  33 y.o. [id number] here for colposcopy for ASCUS with NEGATIVE high risk HPV pap smear on 12/07/23.  Discussed role for HPV in cervical dysplasia, need for surveillance.  Patient gave informed written consent, time out was performed.  Placed in lithotomy position. Cervix viewed with speculum and colposcope after application of acetic acid.   Colposcopy adequate? Yes  acetowhite lesion(s) noted at 5 o'clock; corresponding biopsies obtained.  ECC specimen obtained. All specimens were labeled and sent to pathology.  Chaperone was present during entire procedure.  Patient was given post procedure instructions.  Will follow up pathology and manage accordingly; patient will be contacted with results and recommendations.  Routine preventative health maintenance measures emphasized.   Assessment and Plan:   1. ASCUS of cervix with negative high risk HPV (Primary) Now s/p uncomplicated colposcopy  - Surgical pathology    Routine preventative health maintenance measures emphasized. Please refer to After Visit Summary for other counseling recommendations.   Follow-up: No follow-ups on file.      [name], MD Obstetrician & Gynecologist, Faculty Practice [facility] for [employer], [facility]

## 2024-03-24 ENCOUNTER — Ambulatory Visit: Payer: Self-pay | Admitting: Obstetrics and Gynecology

## 2024-03-24 LAB — SURGICAL PATHOLOGY

## 2024-06-05 ENCOUNTER — Ambulatory Visit (INDEPENDENT_AMBULATORY_CARE_PROVIDER_SITE_OTHER): Payer: Medicaid Other | Admitting: Family Medicine

## 2024-06-05 ENCOUNTER — Encounter: Payer: Self-pay | Admitting: Family Medicine

## 2024-06-05 VITALS — BP 112/60 | HR 98 | Temp 98.2°F | Ht 63.0 in | Wt 155.0 lb

## 2024-06-05 DIAGNOSIS — F331 Major depressive disorder, recurrent, moderate: Secondary | ICD-10-CM | POA: Diagnosis not present

## 2024-06-05 NOTE — Progress Notes (Signed)
 I,Jameka J Llittleton, CMA,acting as a Neurosurgeon for [employer], NP.,have documented all relevant documentation on the behalf of Bruna Creighton, NP,as directed by  Bruna Creighton, NP while in the presence of Bruna Creighton, NP.  Subjective:  Patient ID: Robin Nguyen , female    DOB: 12/03/1990 , 33 y.o.   MRN: [id number]  Chief Complaint  Patient presents with   Depression    Patient presents today for a depression follow up. Patient reports she has not start the medication yet.    Discussed the use of AI scribe software for clinical note transcription with the patient, who gave verbal consent to proceed.  History of Present Illness      HPI  Robin Nguyen is a 33 year old female with depression who presents for a follow-up visit.  She has been taking Zoloft , which helps her relax and reduces negative thoughts. She missed one dose due to not picking up her prescription on time. She is on a 90-day supply since February with two refills remaining, which should last until her next scheduled visit in February.  She was attending physical therapy for neck pain, but it was discontinued due to her work schedule. Despite this, she notes improvement in her neck condition, although occasional discomfort persists.  She visited a gynecologist in June for a Pap smear, which showed low-grade changes. She has not received the results yet.  She is single and currently on birth control. She was previously treated for chlamydia and acknowledges the importance of using protection to prevent sexually transmitted infections.   Depression        This is a chronic problem.  The current episode started 1 to 4 weeks ago.   The onset quality is gradual.   The problem occurs daily.  The problem has been gradually improving since onset.  Associated symptoms include no headaches.     The symptoms are aggravated by work stress.  Past treatments include SSRIs - Selective serotonin reuptake inhibitors.  Compliance with  treatment is good.  Previous treatment provided moderate relief.    Past Medical History:  Diagnosis Date   Depression    Gestational thrombocytopenia (HCC)    Headache    Hepatitis B    HSV (herpes simplex virus) anogenital infection    Migraine    Placenta previa 01/10/2015     Family History  Problem Relation Age of Onset   Hypertension Mother    Healthy Father      Current Outpatient Medications:    Norethindrone Acetate-Ethinyl Estrad-FE (AUROVELA  24 FE) 1-20 MG-MCG(24) tablet, Take 1 tablet by mouth daily., Disp: 28 tablet, Rfl: 11   No Known Allergies   Review of Systems  Constitutional: Negative.   HENT: Negative.    Respiratory: Negative.    Cardiovascular: Negative.   Neurological:  Negative for light-headedness and headaches.  Psychiatric/Behavioral:  Positive for depression. The patient is nervous/anxious.      Today's Vitals   06/05/24 0918  BP: 112/60  Pulse: 98  Temp: 98.2 F (36.8 C)  TempSrc: Oral  Weight: 155 lb (70.3 kg)  Height: 5' 3 (1.6 m)  PainSc: 0-No pain   Body mass index is 27.46 kg/m.  Wt Readings from Last 3 Encounters:  06/05/24 155 lb (70.3 kg)  03/20/24 147 lb 14.4 oz (67.1 kg)  12/07/23 131 lb (59.4 kg)    The ASCVD Risk score (Arnett DK, et al., 2019) failed to calculate for the following reasons:   The 2019 ASCVD risk  score is only valid for ages 60 to 63  Objective:  Physical Exam HENT:     Head: Normocephalic.  Cardiovascular:     Rate and Rhythm: Normal rate and regular rhythm.  Pulmonary:     Effort: Pulmonary effort is normal.     Breath sounds: Normal breath sounds.  Abdominal:     General: Bowel sounds are normal.  Neurological:     Mental Status: She is alert.         Assessment And Plan:  Moderate episode of recurrent major depressive disorder [facility])    Assessment & Plan Depression Depression well-managed with Zoloft . Current dose effective, no adjustment needed. - Continue Zoloft  as  prescribed. - Pick up prescription immediately after the visit. - Schedule follow-up in six months to evaluate medication efficacy.  Low grade squamous intraepithelial lesion on cytologic smear of cervix (LGSIL) LGSIL identified in June Pap smear. - Repeat Pap smear in one year.   Return if symptoms worsen or fail to improve, for keep next appt.  Patient was given opportunity to ask questions. Patient verbalized understanding of the plan and was able to repeat key elements of the plan. All questions were answered to their satisfaction.    I, Bruna Creighton, NP, have reviewed all documentation for this visit. The documentation on 06/23/2024 for the exam, diagnosis, procedures, and orders are all accurate and complete.    IF YOU HAVE BEEN REFERRED TO A SPECIALIST, IT MAY TAKE 1-2 WEEKS TO SCHEDULE/PROCESS THE REFERRAL. IF YOU HAVE NOT HEARD FROM US /SPECIALIST IN TWO WEEKS, PLEASE GIVE US  A CALL AT [phone] X [phone].

## 2024-06-05 NOTE — Patient Instructions (Signed)

## 2024-06-23 NOTE — Assessment & Plan Note (Signed)
 Depression well-managed with Zoloft . Current dose effective, no adjustment needed. - Continue Zoloft  as prescribed. - Pick up prescription immediately after the visit. - Schedule follow-up in six months to evaluate medication efficacy.

## 2024-07-11 ENCOUNTER — Ambulatory Visit (HOSPITAL_COMMUNITY)
Admission: EM | Admit: 2024-07-11 | Discharge: 2024-07-11 | Disposition: A | Discharge status: Home / Self Care | Attending: Family Medicine | Admitting: Family Medicine

## 2024-07-11 ENCOUNTER — Encounter (HOSPITAL_COMMUNITY): Payer: Self-pay

## 2024-07-11 DIAGNOSIS — N898 Other specified noninflammatory disorders of vagina: Secondary | ICD-10-CM | POA: Diagnosis not present

## 2024-07-11 DIAGNOSIS — R350 Frequency of micturition: Secondary | ICD-10-CM | POA: Diagnosis not present

## 2024-07-11 LAB — POCT URINALYSIS DIP (MANUAL ENTRY)
Bilirubin, UA: NEGATIVE
Blood, UA: NEGATIVE
Glucose, UA: NEGATIVE mg/dL
Ketones, POC UA: NEGATIVE mg/dL
Nitrite, UA: NEGATIVE
Protein Ur, POC: NEGATIVE mg/dL
Spec Grav, UA: 1.02 (ref 1.010–1.025)
Urobilinogen, UA: 0.2 U/dL
pH, UA: 7 (ref 5.0–8.0)

## 2024-07-11 MED ORDER — SULFAMETHOXAZOLE-TRIMETHOPRIM 800-160 MG PO TABS: 1.0000 | ORAL_TABLET | Freq: Two times a day (BID) | ORAL | 0 refills | Status: AC

## 2024-07-11 MED ORDER — FLUCONAZOLE 150 MG PO TABS: ORAL_TABLET | ORAL | 0 refills | Status: DC

## 2024-07-11 NOTE — Discharge Instructions (Signed)
 You were seen today for urinary and vaginal symptoms.  I am sending out an antibiotic for possible UTI, but also a mediation for possible yeast infection.  The vaginal swab will be resulted tomorrow and you will be notified if there is anything positive for treatment.   Follow up if not improving.

## 2024-07-11 NOTE — ED Provider Notes (Signed)
 [facility]    CSN: [id number] Arrival date & time: 07/11/24  0809      History   Chief Complaint Chief Complaint  Patient presents with   Dysuria   Urinary Frequency    HPI Robin Nguyen is a 33 y.o. female.    Dysuria Urinary Frequency  Patient is here for painful urination x 4 days.  Also urinary frequency.   No belly or back pain.  No fevers/chills.  No n/v.  She is also having a white vaginal d/c, itching.        Past Medical History:  Diagnosis Date   Depression    Gestational thrombocytopenia    Headache    Hepatitis B    HSV (herpes simplex virus) anogenital infection    Migraine    Placenta previa 01/10/2015    Patient Active Problem List   Diagnosis Date Noted   Family history of mixed hyperlipidemia 12/10/2023   Encounter for screening for HIV 12/10/2023   Screening for cervical cancer 12/10/2023   Encounter for general adult medical examination w/o abnormal findings 12/10/2023   Neck pain on left side 11/12/2023   Establishing care with new doctor, encounter for 11/12/2023   Encounter for initial prescription of contraceptive pills 11/12/2023   Hepatitis B carrier (HCC) 11/07/2023   Moderate episode of recurrent major depressive disorder (HCC) 11/07/2023   Chlamydia infection 12/13/2020   Need for COVID-19 vaccine 12/13/2020   Severe episode of recurrent major depressive disorder, without psychotic features (HCC)    Normal intrauterine pregnancy in third trimester 01/17/2017   Chronic migraine 08/09/2016   Depression, major, single episode, moderate (HCC) 05/29/2015   Status post primary low transverse cesarean section 02/04/2015   Low lying placenta without hemorrhage, antepartum 02/02/2015   IUGR (intrauterine growth restriction) 02/02/2015   Gestational thrombocytopenia without hemorrhage 02/02/2015   Hepatitis B infection 05/01/2014    Past Surgical History:  Procedure Laterality Date   CESAREAN SECTION N/A 02/03/2015    Procedure: CESAREAN SECTION;  Surgeon: Delon Prude, DO;  Location: [facility];  Service: Obstetrics;  Laterality: N/A;   CESAREAN SECTION WITH BILATERAL TUBAL LIGATION N/A 01/17/2017   Procedure: CESAREAN SECTION WITH BILATERAL TUBAL LIGATION;  Surgeon: Jennifer Ozan, DO;  Location: [facility];  Service: Obstetrics;  Laterality: N/A;  EDD 01/24/17    OB History     Gravida  2   Para  2   Term  2   Preterm      AB      Living  2      SAB      IAB      Ectopic      Multiple  0   Live Births  2            Home Medications    Prior to Admission medications   Medication Sig Start Date End Date Taking? Authorizing Provider  Norethindrone Acetate-Ethinyl Estrad-FE (AUROVELA  24 FE) 1-20 MG-MCG(24) tablet Take 1 tablet by mouth daily. 11/07/23   Petrina Pries, NP    Family History Family History  Problem Relation Age of Onset   Hypertension Mother    Healthy Father     Social History Social History   Tobacco Use   Smoking status: Never   Smokeless tobacco: Never  Vaping Use   Vaping status: Never Used  Substance Use Topics   Alcohol use: No   Drug use: No     Allergies   Patient has no known allergies.  Review of Systems Review of Systems  Constitutional: Negative.   HENT: Negative.    Respiratory: Negative.    Gastrointestinal: Negative.   Genitourinary:  Positive for dysuria and frequency.  Musculoskeletal: Negative.   Psychiatric/Behavioral: Negative.       Physical Exam Triage Vital Signs ED Triage Vitals [07/11/24 0834]  Encounter Vitals Group     BP 118/80     Girls Systolic BP Percentile      Girls Diastolic BP Percentile      Boys Systolic BP Percentile      Boys Diastolic BP Percentile      Pulse Rate 85     Resp 14     Temp 97.9 F (36.6 C)     Temp Source Oral     SpO2 97 %     Weight      Height      Head Circumference      Peak Flow      Pain Score 7     Pain Loc      Pain Education      Exclude from  Growth Chart    No data found.  Updated Vital Signs BP 118/80 (BP Location: Right Arm)   Pulse 85   Temp 97.9 F (36.6 C) (Oral)   Resp 14   LMP 06/30/2024 (Approximate)   SpO2 97%   Visual Acuity Right Eye Distance:   Left Eye Distance:   Bilateral Distance:    Right Eye Near:   Left Eye Near:    Bilateral Near:     Physical Exam Constitutional:      General: She is not in acute distress.    Appearance: Normal appearance. She is normal weight. She is not ill-appearing or toxic-appearing.  Cardiovascular:     Rate and Rhythm: Normal rate and regular rhythm.  Pulmonary:     Effort: Pulmonary effort is normal.     Breath sounds: Normal breath sounds.  Abdominal:     Palpations: Abdomen is soft.     Tenderness: There is no abdominal tenderness. There is no guarding or rebound.  Neurological:     General: No focal deficit present.     Mental Status: She is alert.  Psychiatric:        Mood and Affect: Mood normal.      UC Treatments / Results  Labs (all labs ordered are listed, but only abnormal results are displayed) Labs Reviewed  POCT URINALYSIS DIP (MANUAL ENTRY) - Abnormal; Notable for the following components:      Result Value   Clarity, UA cloudy (*)    Leukocytes, UA Small (1+) (*)    All other components within normal limits  URINE CULTURE  CERVICOVAGINAL ANCILLARY ONLY    EKG   Radiology No results found.  Procedures Procedures (including critical care time)  Medications Ordered in UC Medications - No data to display  Initial Impression / Assessment and Plan / UC Course  I have reviewed the triage vital signs and the nursing notes.  Pertinent labs & imaging results that were available during my care of the patient were reviewed by me and considered in my medical decision making (see chart for details).  Final Clinical Impressions(s) / UC Diagnoses   Final diagnoses:  Urinary frequency  Vaginal discharge     Discharge Instructions       You were seen today for urinary and vaginal symptoms.  I am sending out an antibiotic for possible UTI, but also a  mediation for possible yeast infection.  The vaginal swab will be resulted tomorrow and you will be notified if there is anything positive for treatment.   Follow up if not improving.     ED Prescriptions     Medication Sig Dispense Auth. Provider   fluconazole  (DIFLUCAN ) 150 MG tablet 1 tab po x 1 dose, repeat in 3 days 2 tablet D'Arcy Abraha, MD   sulfamethoxazole -trimethoprim  (BACTRIM  DS) 800-160 MG tablet Take 1 tablet by mouth 2 (two) times daily for 5 days. 10 tablet Darral Longs, MD      PDMP not reviewed this encounter.   Darral Longs, MD 07/11/24 [id number]

## 2024-07-11 NOTE — ED Triage Notes (Signed)
 Patient c/o dysuria and urinary frequency x 4 days.  Patient has not had any medication for her symptoms.

## 2024-07-13 LAB — URINE CULTURE: Culture: >=100000 — AB

## 2024-07-14 ENCOUNTER — Ambulatory Visit (HOSPITAL_COMMUNITY): Payer: Self-pay

## 2024-07-14 LAB — CERVICOVAGINAL ANCILLARY ONLY
Bacterial Vaginitis (gardnerella): POSITIVE — AB
Candida Glabrata: NEGATIVE
Candida Vaginitis: POSITIVE — AB
Chlamydia: NEGATIVE
Comment: NEGATIVE
Comment: NEGATIVE
Comment: NEGATIVE
Comment: NEGATIVE
Comment: NEGATIVE
Comment: NORMAL
Neisseria Gonorrhea: NEGATIVE
Trichomonas: NEGATIVE

## 2024-07-14 MED ORDER — METRONIDAZOLE 500 MG PO TABS
500.0000 mg | ORAL_TABLET | Freq: Two times a day (BID) | ORAL | 0 refills | Status: AC
Start: 2024-07-14 — End: 2024-07-21

## 2024-07-29 ENCOUNTER — Encounter (HOSPITAL_BASED_OUTPATIENT_CLINIC_OR_DEPARTMENT_OTHER): Payer: Self-pay | Admitting: *Deleted

## 2024-07-29 ENCOUNTER — Emergency Department (HOSPITAL_BASED_OUTPATIENT_CLINIC_OR_DEPARTMENT_OTHER)
Admission: EM | Admit: 2024-07-29 | Discharge: 2024-07-29 | Disposition: A | Discharge status: Home / Self Care | Attending: Emergency Medicine | Admitting: Emergency Medicine

## 2024-07-29 ENCOUNTER — Other Ambulatory Visit: Payer: Self-pay

## 2024-07-29 ENCOUNTER — Emergency Department (HOSPITAL_BASED_OUTPATIENT_CLINIC_OR_DEPARTMENT_OTHER)

## 2024-07-29 DIAGNOSIS — S199XXA Unspecified injury of neck, initial encounter: Secondary | ICD-10-CM | POA: Diagnosis not present

## 2024-07-29 DIAGNOSIS — Y92410 Unspecified street and highway as the place of occurrence of the external cause: Secondary | ICD-10-CM | POA: Diagnosis not present

## 2024-07-29 DIAGNOSIS — M542 Cervicalgia: Secondary | ICD-10-CM | POA: Diagnosis not present

## 2024-07-29 NOTE — Discharge Instructions (Addendum)
 It was a pleasure taking care of you today.  As discussed, your imaging was negative for any fractures. Supportive care measures at home for neck pain are recommended such as rest, ice, heat.  You may take over-the-counter pain medications such as ibuprofen  or Tylenol  as needed for pain.  Please follow-up with your primary care and come back if anything worsens or new symptoms emerge.
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
## 2024-07-29 NOTE — ED Notes (Signed)
 PT observed in [facility] when brought in by [facility]
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
## 2024-08-18 ENCOUNTER — Encounter: Payer: Self-pay | Admitting: Radiology

## 2024-08-22 ENCOUNTER — Ambulatory Visit: Payer: Self-pay | Admitting: Family Medicine

## 2024-08-22 ENCOUNTER — Encounter: Payer: Self-pay | Admitting: Family Medicine

## 2024-08-22 VITALS — BP 120/70 | HR 92 | Temp 98.5°F | Ht 63.0 in | Wt 153.0 lb

## 2024-08-22 DIAGNOSIS — Z2821 Immunization not carried out because of patient refusal: Secondary | ICD-10-CM | POA: Insufficient documentation

## 2024-08-22 DIAGNOSIS — M542 Cervicalgia: Secondary | ICD-10-CM

## 2024-08-22 DIAGNOSIS — M62838 Other muscle spasm: Secondary | ICD-10-CM

## 2024-08-22 DIAGNOSIS — F331 Major depressive disorder, recurrent, moderate: Secondary | ICD-10-CM

## 2024-08-22 DIAGNOSIS — Z23 Encounter for immunization: Secondary | ICD-10-CM

## 2024-08-22 MED ORDER — METHOCARBAMOL 500 MG PO TABS
500.0000 mg | ORAL_TABLET | Freq: Three times a day (TID) | ORAL | 0 refills | Status: AC | PRN
Start: 2024-08-22 — End: ?

## 2024-08-22 MED ORDER — SERTRALINE HCL 50 MG PO TABS
50.0000 mg | ORAL_TABLET | Freq: Every day | ORAL | 0 refills | Status: AC
Start: 2024-08-22 — End: ?

## 2024-08-22 NOTE — Assessment & Plan Note (Signed)
 Influenza vaccine given
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
## 2024-08-22 NOTE — Assessment & Plan Note (Signed)
 Recurrent moderate major depressive disorder. - Continue Zoloft . - Ensured 90-day Zoloft  prescription.

## 2024-08-22 NOTE — Assessment & Plan Note (Signed)
-   Prescribed muscle relaxant for neck stiffness and spasm. - Continue chiropractic treatment.

## 2024-10-19 ENCOUNTER — Other Ambulatory Visit: Payer: Self-pay | Admitting: Family Medicine

## 2024-10-19 DIAGNOSIS — Z30011 Encounter for initial prescription of contraceptive pills: Secondary | ICD-10-CM

## 2024-10-22 ENCOUNTER — Encounter (HOSPITAL_COMMUNITY): Payer: Self-pay | Admitting: Emergency Medicine

## 2024-10-22 ENCOUNTER — Ambulatory Visit (HOSPITAL_COMMUNITY)
Admission: EM | Admit: 2024-10-22 | Discharge: 2024-10-22 | Disposition: A | Discharge status: Home / Self Care | Attending: Internal Medicine | Admitting: Internal Medicine

## 2024-10-22 DIAGNOSIS — K219 Gastro-esophageal reflux disease without esophagitis: Secondary | ICD-10-CM | POA: Diagnosis not present

## 2024-10-22 MED ORDER — OMEPRAZOLE 20 MG PO CPDR: 20.0000 mg | DELAYED_RELEASE_CAPSULE | Freq: Every day | ORAL | 0 refills | Status: AC

## 2024-10-22 NOTE — ED Triage Notes (Signed)
 Pt reports that she eats spicy foods and never had a problem until in past week when eats it will have abd pains and will vomit her food. Hasn't tried any medications for her symptoms. Coworker told her could be an ulcer.

## 2024-10-22 NOTE — ED Provider Notes (Signed)
 " [facility]    CSN: [id number] Arrival date & time: 10/22/24  1003      History   Chief Complaint Chief Complaint  Patient presents with   Abdominal Pain    HPI Robin Nguyen is a 34 y.o. female presenting to urgent care endorsing a 2-week history of epigastric pain and nausea after eating spicy foods.  She is asymptomatic when eating nonspicy foods, however she describes burning sensation in the epigastric region with persistent nausea after eating spicy food.  She eventually vomits, which alleviates her symptoms.  Emesis is described as nonbloody and nonbilious.  Denies fever/chills, cough/pain production, shortness of breath, lower abdominal pain, diarrhea/constipation.  She has not tried taking any medications to alleviate her symptoms.  No symptoms occur when she avoids spicy foods.  No prior history of GERD or PUD.  A coworker stated that she may have an ulcer and recommended that she come to urgent care for evaluation.    Past Medical History:  Diagnosis Date   Depression    Gestational thrombocytopenia    Headache    Hepatitis B    HSV (herpes simplex virus) anogenital infection    Migraine    Placenta previa 01/10/2015    Patient Active Problem List   Diagnosis Date Noted   Cervicalgia 08/22/2024   Neck muscle spasm 08/22/2024   Need for influenza vaccination 08/22/2024   Tetanus, diphtheria, and acellular pertussis (Tdap) vaccination declined 08/22/2024   Family history of mixed hyperlipidemia 12/10/2023   Encounter for screening for HIV 12/10/2023   Screening for cervical cancer 12/10/2023   Encounter for general adult medical examination w/o abnormal findings 12/10/2023   Neck pain on left side 11/12/2023   Establishing care with new doctor, encounter for 11/12/2023   Encounter for initial prescription of contraceptive pills 11/12/2023   Hepatitis B carrier (HCC) 11/07/2023   Moderate episode of recurrent major depressive disorder (HCC) 11/07/2023    Chlamydia infection 12/13/2020   Need for COVID-19 vaccine 12/13/2020   Severe episode of recurrent major depressive disorder, without psychotic features (HCC)    Normal intrauterine pregnancy in third trimester 01/17/2017   Chronic migraine 08/09/2016   Depression, major, single episode, moderate (HCC) 05/29/2015   Status post primary low transverse cesarean section 02/04/2015   Low lying placenta without hemorrhage, antepartum 02/02/2015   IUGR (intrauterine growth restriction) 02/02/2015   Gestational thrombocytopenia without hemorrhage 02/02/2015   Hepatitis B infection 05/01/2014    Past Surgical History:  Procedure Laterality Date   CESAREAN SECTION N/A 02/03/2015   Procedure: CESAREAN SECTION;  Surgeon: Delon Prude, DO;  Location: [facility];  Service: Obstetrics;  Laterality: N/A;   CESAREAN SECTION WITH BILATERAL TUBAL LIGATION N/A 01/17/2017   Procedure: CESAREAN SECTION WITH BILATERAL TUBAL LIGATION;  Surgeon: Jennifer Ozan, DO;  Location: [facility];  Service: Obstetrics;  Laterality: N/A;  EDD 01/24/17    OB History     Gravida  2   Para  2   Term  2   Preterm      AB      Living  2      SAB      IAB      Ectopic      Multiple  0   Live Births  2          Home Medications    Prior to Admission medications  Medication Sig Start Date End Date Taking? Authorizing Provider  omeprazole  (PRILOSEC) 20 MG capsule Take  1 capsule (20 mg total) by mouth daily. 10/22/24  Yes Melvenia Manus BRAVO, MD  LARIN 24 FE 1-20 MG-MCG(24) tablet Take 1 tablet by mouth once daily 10/20/24   Petrina Pries, NP  methocarbamol  (ROBAXIN ) 500 MG tablet Take 1 tablet (500 mg total) by mouth every 8 (eight) hours as needed for muscle spasms. 08/22/24   Petrina Pries, NP  sertraline  (ZOLOFT ) 50 MG tablet Take 1 tablet (50 mg total) by mouth daily. 08/22/24   Petrina Pries, NP    Family History Family History  Problem Relation Age of Onset   Hypertension Mother    Healthy Father      Social History Social History[1]   Allergies   Patient has no known allergies.   Review of Systems Review of Systems  Gastrointestinal:  Positive for abdominal pain, nausea and vomiting.  All other systems reviewed and are negative.  Physical Exam Triage Vital Signs ED Triage Vitals [10/22/24 1146]  Encounter Vitals Group     BP 111/74     Girls Systolic BP Percentile      Girls Diastolic BP Percentile      Boys Systolic BP Percentile      Boys Diastolic BP Percentile      Pulse Rate 69     Resp 17     Temp 98.2 F (36.8 C)     Temp Source Oral     SpO2 99 %     Weight      Height      Head Circumference      Peak Flow      Pain Score      Pain Loc      Pain Education      Exclude from Growth Chart    No data found.  Updated Vital Signs BP 111/74 (BP Location: Right Arm)   Pulse 69   Temp 98.2 F (36.8 C) (Oral)   Resp 17   LMP 10/21/2024 (Exact Date)   SpO2 99%   Physical Exam Vitals reviewed.  Constitutional:      General: She is not in acute distress.    Appearance: She is well-developed. She is not toxic-appearing.  HENT:     Head: Normocephalic and atraumatic.     Mouth/Throat:     Pharynx: Oropharynx is clear. No pharyngeal swelling or oropharyngeal exudate.  Eyes:     Extraocular Movements: Extraocular movements intact.     Pupils: Pupils are equal, round, and reactive to light.  Cardiovascular:     Rate and Rhythm: Normal rate and regular rhythm.     Heart sounds: Normal heart sounds.  Pulmonary:     Effort: Pulmonary effort is normal.     Breath sounds: Normal breath sounds. No wheezing, rhonchi or rales.  Abdominal:     General: Abdomen is flat. Bowel sounds are normal.     Tenderness: There is abdominal tenderness in the epigastric area. There is no right CVA tenderness, left CVA tenderness, guarding or rebound. Negative signs include Murphy's sign, Rovsing's sign, McBurney's sign and psoas sign.  Skin:    General: Skin is warm  and dry.  Neurological:     General: No focal deficit present.     Mental Status: She is alert.  Psychiatric:        Mood and Affect: Mood normal.        Behavior: Behavior normal.    UC Treatments / Results  Labs (all labs ordered are listed, but only abnormal results are  displayed) Labs Reviewed - No data to display  EKG   Radiology No results found.  Procedures Procedures (including critical care time)  Medications Ordered in UC Medications - No data to display  Initial Impression / Assessment and Plan / UC Course  I have reviewed the triage vital signs and the nursing notes.  Pertinent labs & imaging results that were available during my care of the patient were reviewed by me and considered in my medical decision making (see chart for details).    Patient 34 year old female presenting to urgent care endorsing burning epigastric pain with nausea and vomiting after eating spicy foods.  On exam today there is mild TTP to the epigastric region.  Her exam is otherwise reassuring.  Differential diagnosis includes GERD vs peptic ulcer disease vs gastritis.  Recommend trial of PPI and avoidance of known triggers.  I prescribed omeprazole  20 mg daily.  Can increase to 40 mg daily if an adequate.  I recommended that she follow-up with her PCP for additional testing if symptoms return after completing PPI trial.  She expressed understanding and agreement with the plan stated above.  Return precautions reviewed.  Patient is medically stable for discharge at this time.  Final Clinical Impressions(s) / UC Diagnoses   Final diagnoses:  Gastroesophageal reflux disease, unspecified whether esophagitis present     Discharge Instructions      Your symptoms seem most consistent with gastric reflux. I recommend taking omeprazole  20 mg daily and avoiding spicy foods known to trigger your symptoms. Can increase omeprazole  to 40 mg daily if symptoms persist and should follow up with your  primary care doctor. Please return to care if symptoms occur more frequently, you develops nausea with vomiting of bloody or dark, coffee-colored emesis, or begin to feel worse in general.     ED Prescriptions     Medication Sig Dispense Auth. Provider   omeprazole  (PRILOSEC) 20 MG capsule Take 1 capsule (20 mg total) by mouth daily. 30 capsule Venezia Sargeant E, MD      PDMP not reviewed this encounter.    [1]  Social History Tobacco Use   Smoking status: Never   Smokeless tobacco: Never  Vaping Use   Vaping status: Never Used  Substance Use Topics   Alcohol use: No   Drug use: No     Melvenia Manus BRAVO, MD 10/22/24 1210  "

## 2024-10-22 NOTE — Discharge Instructions (Signed)
 Your symptoms seem most consistent with gastric reflux. I recommend taking omeprazole  20 mg daily and avoiding spicy foods known to trigger your symptoms. Can increase omeprazole  to 40 mg daily if symptoms persist and should follow up with your primary care doctor. Please return to care if symptoms occur more frequently, you develops nausea with vomiting of bloody or dark, coffee-colored emesis, or begin to feel worse in general.

## 2024-11-11 ENCOUNTER — Other Ambulatory Visit: Payer: Self-pay | Admitting: Family Medicine

## 2024-11-11 DIAGNOSIS — Z30011 Encounter for initial prescription of contraceptive pills: Secondary | ICD-10-CM

## 2024-12-11 ENCOUNTER — Encounter: Payer: Self-pay | Admitting: Family Medicine
